# Patient Record
Sex: Male | Born: 1945 | Race: White | Hispanic: No | Smoking: Former smoker
Health system: Southern US, Community
[De-identification: ages and names within clinical notes are randomized; demographics above are authoritative.]

## PROBLEM LIST (undated history)

## (undated) DIAGNOSIS — Z972 Presence of dental prosthetic device (complete) (partial): Secondary | ICD-10-CM

## (undated) DIAGNOSIS — I2510 Atherosclerotic heart disease of native coronary artery without angina pectoris: Secondary | ICD-10-CM

## (undated) DIAGNOSIS — J449 Chronic obstructive pulmonary disease, unspecified: Secondary | ICD-10-CM

## (undated) DIAGNOSIS — IMO0002 Reserved for concepts with insufficient information to code with codable children: Secondary | ICD-10-CM

## (undated) DIAGNOSIS — I10 Essential (primary) hypertension: Secondary | ICD-10-CM

## (undated) DIAGNOSIS — K08109 Complete loss of teeth, unspecified cause, unspecified class: Secondary | ICD-10-CM

## (undated) DIAGNOSIS — Z95810 Presence of automatic (implantable) cardiac defibrillator: Secondary | ICD-10-CM

## (undated) DIAGNOSIS — Z973 Presence of spectacles and contact lenses: Secondary | ICD-10-CM

## (undated) DIAGNOSIS — E230 Hypopituitarism: Secondary | ICD-10-CM

## (undated) DIAGNOSIS — E782 Mixed hyperlipidemia: Secondary | ICD-10-CM

## (undated) DIAGNOSIS — E236 Other disorders of pituitary gland: Secondary | ICD-10-CM

## (undated) DIAGNOSIS — F1011 Alcohol abuse, in remission: Secondary | ICD-10-CM

## (undated) DIAGNOSIS — K219 Gastro-esophageal reflux disease without esophagitis: Secondary | ICD-10-CM

## (undated) DIAGNOSIS — E669 Obesity, unspecified: Secondary | ICD-10-CM

## (undated) DIAGNOSIS — F32A Depression, unspecified: Secondary | ICD-10-CM

## (undated) DIAGNOSIS — G4730 Sleep apnea, unspecified: Secondary | ICD-10-CM

## (undated) DIAGNOSIS — F329 Major depressive disorder, single episode, unspecified: Secondary | ICD-10-CM

## (undated) DIAGNOSIS — E7800 Pure hypercholesterolemia, unspecified: Secondary | ICD-10-CM

## (undated) DIAGNOSIS — M1990 Unspecified osteoarthritis, unspecified site: Secondary | ICD-10-CM

## (undated) HISTORY — PX: ANKLE SURGERY: SHX546

## (undated) HISTORY — DX: Reserved for concepts with insufficient information to code with codable children: IMO0002

## (undated) HISTORY — PX: UMBILICAL HERNIA REPAIR: SHX196

## (undated) HISTORY — DX: Mixed hyperlipidemia: E78.2

## (undated) HISTORY — DX: Atherosclerotic heart disease of native coronary artery without angina pectoris: I25.10

## (undated) HISTORY — DX: Depression, unspecified: F32.A

## (undated) HISTORY — PX: BACK SURGERY: SHX140

## (undated) HISTORY — DX: Other disorders of pituitary gland: E23.6

## (undated) HISTORY — DX: Gastro-esophageal reflux disease without esophagitis: K21.9

## (undated) HISTORY — DX: Essential (primary) hypertension: I10

## (undated) HISTORY — DX: Hypopituitarism: E23.0

## (undated) HISTORY — DX: Alcohol abuse, in remission: F10.11

## (undated) HISTORY — DX: Major depressive disorder, single episode, unspecified: F32.9

## (undated) HISTORY — DX: Chronic obstructive pulmonary disease, unspecified: J44.9

## (undated) HISTORY — DX: Sleep apnea, unspecified: G47.30

## (undated) HISTORY — DX: Obesity, unspecified: E66.9

## (undated) HISTORY — PX: KNEE SURGERY: SHX244

## (undated) HISTORY — PX: NASAL SINUS SURGERY: SHX719

## (undated) HISTORY — DX: Unspecified osteoarthritis, unspecified site: M19.90

## (undated) HISTORY — PX: FEMUR SURGERY: SHX943

---

## 1999-10-27 ENCOUNTER — Inpatient Hospital Stay (HOSPITAL_COMMUNITY): Admission: AD | Admit: 1999-10-27 | Discharge: 1999-10-31 | Payer: Self-pay | Admitting: *Deleted

## 2000-03-11 ENCOUNTER — Inpatient Hospital Stay (HOSPITAL_COMMUNITY): Admission: AD | Admit: 2000-03-11 | Discharge: 2000-03-15 | Payer: Self-pay | Admitting: *Deleted

## 2000-11-01 ENCOUNTER — Ambulatory Visit (HOSPITAL_COMMUNITY): Admission: RE | Admit: 2000-11-01 | Discharge: 2000-11-01 | Payer: Self-pay | Admitting: *Deleted

## 2000-11-01 ENCOUNTER — Encounter: Payer: Self-pay | Admitting: *Deleted

## 2000-12-15 ENCOUNTER — Encounter: Payer: Self-pay | Admitting: Neurosurgery

## 2000-12-15 ENCOUNTER — Ambulatory Visit (HOSPITAL_COMMUNITY): Admission: RE | Admit: 2000-12-15 | Discharge: 2000-12-15 | Payer: Self-pay | Admitting: Neurosurgery

## 2001-01-07 ENCOUNTER — Encounter: Payer: Self-pay | Admitting: Neurosurgery

## 2001-01-07 ENCOUNTER — Inpatient Hospital Stay (HOSPITAL_COMMUNITY): Admission: RE | Admit: 2001-01-07 | Discharge: 2001-01-12 | Payer: Self-pay | Admitting: Neurosurgery

## 2001-01-26 ENCOUNTER — Encounter: Admission: RE | Admit: 2001-01-26 | Discharge: 2001-01-26 | Payer: Self-pay | Admitting: Neurosurgery

## 2001-01-26 ENCOUNTER — Encounter: Payer: Self-pay | Admitting: Neurosurgery

## 2001-02-21 ENCOUNTER — Encounter: Payer: Self-pay | Admitting: Neurosurgery

## 2001-02-21 ENCOUNTER — Encounter: Admission: RE | Admit: 2001-02-21 | Discharge: 2001-02-21 | Payer: Self-pay | Admitting: Neurosurgery

## 2001-04-21 ENCOUNTER — Encounter: Admission: RE | Admit: 2001-04-21 | Discharge: 2001-04-21 | Payer: Self-pay | Admitting: Neurosurgery

## 2001-04-21 ENCOUNTER — Encounter: Payer: Self-pay | Admitting: Neurosurgery

## 2003-08-12 ENCOUNTER — Emergency Department (HOSPITAL_COMMUNITY): Admission: AD | Admit: 2003-08-12 | Discharge: 2003-08-12 | Payer: Self-pay | Admitting: Family Medicine

## 2003-08-14 ENCOUNTER — Emergency Department (HOSPITAL_COMMUNITY): Admission: EM | Admit: 2003-08-14 | Discharge: 2003-08-15 | Payer: Self-pay | Admitting: Internal Medicine

## 2003-08-16 ENCOUNTER — Ambulatory Visit (HOSPITAL_COMMUNITY): Admission: RE | Admit: 2003-08-16 | Discharge: 2003-08-16 | Payer: Self-pay | Admitting: Emergency Medicine

## 2003-08-21 ENCOUNTER — Ambulatory Visit (HOSPITAL_COMMUNITY): Admission: RE | Admit: 2003-08-21 | Discharge: 2003-08-21 | Payer: Self-pay | Admitting: Neurosurgery

## 2003-08-21 ENCOUNTER — Emergency Department (HOSPITAL_COMMUNITY): Admission: EM | Admit: 2003-08-21 | Discharge: 2003-08-21 | Payer: Self-pay | Admitting: Emergency Medicine

## 2003-08-30 ENCOUNTER — Inpatient Hospital Stay (HOSPITAL_COMMUNITY): Admission: RE | Admit: 2003-08-30 | Discharge: 2003-08-31 | Payer: Self-pay | Admitting: Neurosurgery

## 2004-02-18 ENCOUNTER — Inpatient Hospital Stay (HOSPITAL_BASED_OUTPATIENT_CLINIC_OR_DEPARTMENT_OTHER): Admission: RE | Admit: 2004-02-18 | Discharge: 2004-02-18 | Payer: Self-pay | Admitting: Cardiology

## 2004-07-14 ENCOUNTER — Emergency Department (HOSPITAL_COMMUNITY): Admission: EM | Admit: 2004-07-14 | Discharge: 2004-07-14 | Payer: Self-pay | Admitting: Emergency Medicine

## 2004-08-07 ENCOUNTER — Ambulatory Visit: Payer: Self-pay | Admitting: Cardiology

## 2004-10-31 ENCOUNTER — Ambulatory Visit: Payer: Self-pay | Admitting: Cardiology

## 2005-12-11 ENCOUNTER — Ambulatory Visit: Payer: Self-pay | Admitting: Cardiology

## 2005-12-21 ENCOUNTER — Ambulatory Visit: Payer: Self-pay | Admitting: Cardiology

## 2005-12-21 ENCOUNTER — Encounter: Payer: Self-pay | Admitting: Cardiology

## 2006-01-13 ENCOUNTER — Ambulatory Visit: Payer: Self-pay | Admitting: Cardiology

## 2006-01-15 ENCOUNTER — Ambulatory Visit: Payer: Self-pay | Admitting: Cardiology

## 2006-04-09 ENCOUNTER — Ambulatory Visit (HOSPITAL_COMMUNITY): Admission: RE | Admit: 2006-04-09 | Discharge: 2006-04-09 | Payer: Self-pay | Admitting: General Surgery

## 2007-03-30 ENCOUNTER — Ambulatory Visit: Payer: Self-pay | Admitting: Physician Assistant

## 2007-03-31 ENCOUNTER — Encounter: Payer: Self-pay | Admitting: Physician Assistant

## 2007-05-10 ENCOUNTER — Ambulatory Visit: Payer: Self-pay | Admitting: Cardiology

## 2007-05-17 ENCOUNTER — Ambulatory Visit: Payer: Self-pay | Admitting: Cardiology

## 2008-03-06 ENCOUNTER — Encounter: Payer: Self-pay | Admitting: Cardiology

## 2008-03-08 ENCOUNTER — Encounter: Payer: Self-pay | Admitting: Cardiology

## 2008-05-29 ENCOUNTER — Ambulatory Visit: Payer: Self-pay | Admitting: Cardiology

## 2008-07-16 ENCOUNTER — Encounter: Payer: Self-pay | Admitting: Cardiology

## 2008-10-22 ENCOUNTER — Encounter: Payer: Self-pay | Admitting: Cardiology

## 2008-12-23 ENCOUNTER — Ambulatory Visit: Payer: Self-pay | Admitting: Cardiology

## 2008-12-24 ENCOUNTER — Inpatient Hospital Stay (HOSPITAL_COMMUNITY): Admission: EM | Admit: 2008-12-24 | Discharge: 2008-12-28 | Payer: Self-pay | Admitting: Emergency Medicine

## 2008-12-24 ENCOUNTER — Encounter (INDEPENDENT_AMBULATORY_CARE_PROVIDER_SITE_OTHER): Payer: Self-pay | Admitting: Internal Medicine

## 2008-12-24 ENCOUNTER — Encounter: Payer: Self-pay | Admitting: Cardiology

## 2009-01-17 ENCOUNTER — Ambulatory Visit: Payer: Self-pay | Admitting: Cardiology

## 2009-01-17 ENCOUNTER — Encounter: Payer: Self-pay | Admitting: Physician Assistant

## 2009-01-20 ENCOUNTER — Inpatient Hospital Stay (HOSPITAL_COMMUNITY): Admission: EM | Admit: 2009-01-20 | Discharge: 2009-01-22 | Payer: Self-pay | Admitting: Emergency Medicine

## 2009-01-20 ENCOUNTER — Ambulatory Visit: Payer: Self-pay | Admitting: Internal Medicine

## 2009-01-24 ENCOUNTER — Telehealth (INDEPENDENT_AMBULATORY_CARE_PROVIDER_SITE_OTHER): Payer: Self-pay | Admitting: *Deleted

## 2009-02-04 ENCOUNTER — Ambulatory Visit: Payer: Self-pay | Admitting: Internal Medicine

## 2009-02-04 DIAGNOSIS — R002 Palpitations: Secondary | ICD-10-CM | POA: Insufficient documentation

## 2009-02-04 DIAGNOSIS — I2510 Atherosclerotic heart disease of native coronary artery without angina pectoris: Secondary | ICD-10-CM | POA: Insufficient documentation

## 2009-02-04 DIAGNOSIS — I959 Hypotension, unspecified: Secondary | ICD-10-CM | POA: Insufficient documentation

## 2009-02-04 DIAGNOSIS — I495 Sick sinus syndrome: Secondary | ICD-10-CM | POA: Insufficient documentation

## 2009-02-08 ENCOUNTER — Ambulatory Visit: Payer: Self-pay | Admitting: Cardiology

## 2009-02-08 ENCOUNTER — Encounter: Payer: Self-pay | Admitting: Internal Medicine

## 2009-03-11 ENCOUNTER — Telehealth (INDEPENDENT_AMBULATORY_CARE_PROVIDER_SITE_OTHER): Payer: Self-pay | Admitting: *Deleted

## 2009-04-18 ENCOUNTER — Encounter: Payer: Self-pay | Admitting: Cardiology

## 2009-05-24 ENCOUNTER — Ambulatory Visit: Payer: Self-pay | Admitting: Cardiology

## 2009-05-24 DIAGNOSIS — I429 Cardiomyopathy, unspecified: Secondary | ICD-10-CM | POA: Insufficient documentation

## 2009-05-24 DIAGNOSIS — E782 Mixed hyperlipidemia: Secondary | ICD-10-CM | POA: Insufficient documentation

## 2009-05-24 DIAGNOSIS — I5022 Chronic systolic (congestive) heart failure: Secondary | ICD-10-CM | POA: Insufficient documentation

## 2009-06-12 ENCOUNTER — Encounter: Payer: Self-pay | Admitting: Internal Medicine

## 2009-06-13 ENCOUNTER — Ambulatory Visit: Payer: Self-pay | Admitting: Internal Medicine

## 2009-06-13 ENCOUNTER — Encounter (INDEPENDENT_AMBULATORY_CARE_PROVIDER_SITE_OTHER): Payer: Self-pay | Admitting: *Deleted

## 2009-06-14 LAB — CONVERTED CEMR LAB
BUN: 15 mg/dL (ref 6–23)
Basophils Absolute: 0 10*3/uL (ref 0.0–0.1)
Basophils Relative: 1 % (ref 0.0–3.0)
CO2: 32 meq/L (ref 19–32)
Calcium: 9.2 mg/dL (ref 8.4–10.5)
Chloride: 98 meq/L (ref 96–112)
Creatinine, Ser: 1 mg/dL (ref 0.4–1.5)
Eosinophils Absolute: 0.1 10*3/uL (ref 0.0–0.7)
Eosinophils Relative: 3 % (ref 0.0–5.0)
GFR calc non Af Amer: 80.29 mL/min (ref >60)
Glucose, Bld: 100 mg/dL — ABNORMAL HIGH (ref 70–99)
HCT: 40.1 % (ref 39.0–52.0)
Hemoglobin: 13.6 g/dL (ref 13.0–17.0)
INR: 1 (ref 0.8–1.0)
Lymphocytes Relative: 39.7 % (ref 12.0–46.0)
Lymphs Abs: 2 10*3/uL (ref 0.7–4.0)
MCHC: 33.9 g/dL (ref 30.0–36.0)
MCV: 93.3 fL (ref 78.0–100.0)
Monocytes Absolute: 0.6 10*3/uL (ref 0.1–1.0)
Monocytes Relative: 12.2 % — ABNORMAL HIGH (ref 3.0–12.0)
Neutro Abs: 2.3 10*3/uL (ref 1.4–7.7)
Neutrophils Relative %: 44.1 % (ref 43.0–77.0)
Platelets: 246 10*3/uL (ref 150.0–400.0)
Potassium: 4.4 meq/L (ref 3.5–5.1)
Prothrombin Time: 10.4 s (ref 9.1–11.7)
RBC: 4.3 M/uL (ref 4.22–5.81)
RDW: 12.3 % (ref 11.5–14.6)
Sodium: 141 meq/L (ref 135–145)
WBC: 5 10*3/uL (ref 4.5–10.5)
aPTT: 31.4 s — ABNORMAL HIGH (ref 21.7–28.8)

## 2009-06-18 ENCOUNTER — Ambulatory Visit: Payer: Self-pay | Admitting: Internal Medicine

## 2009-06-18 ENCOUNTER — Inpatient Hospital Stay (HOSPITAL_COMMUNITY): Admission: AD | Admit: 2009-06-18 | Discharge: 2009-06-19 | Payer: Self-pay | Admitting: Internal Medicine

## 2009-06-18 HISTORY — PX: OTHER SURGICAL HISTORY: SHX169

## 2009-06-19 ENCOUNTER — Encounter: Payer: Self-pay | Admitting: Internal Medicine

## 2009-06-26 ENCOUNTER — Ambulatory Visit: Payer: Self-pay | Admitting: Internal Medicine

## 2009-07-10 IMAGING — CR DG CHEST 1V PORT
1 series · 1 of 1 positions shown · non-contrast
Comparison: None available.

CLINICAL DATA: Weakness.

PORTABLE CHEST - 1 VIEW

[AP]
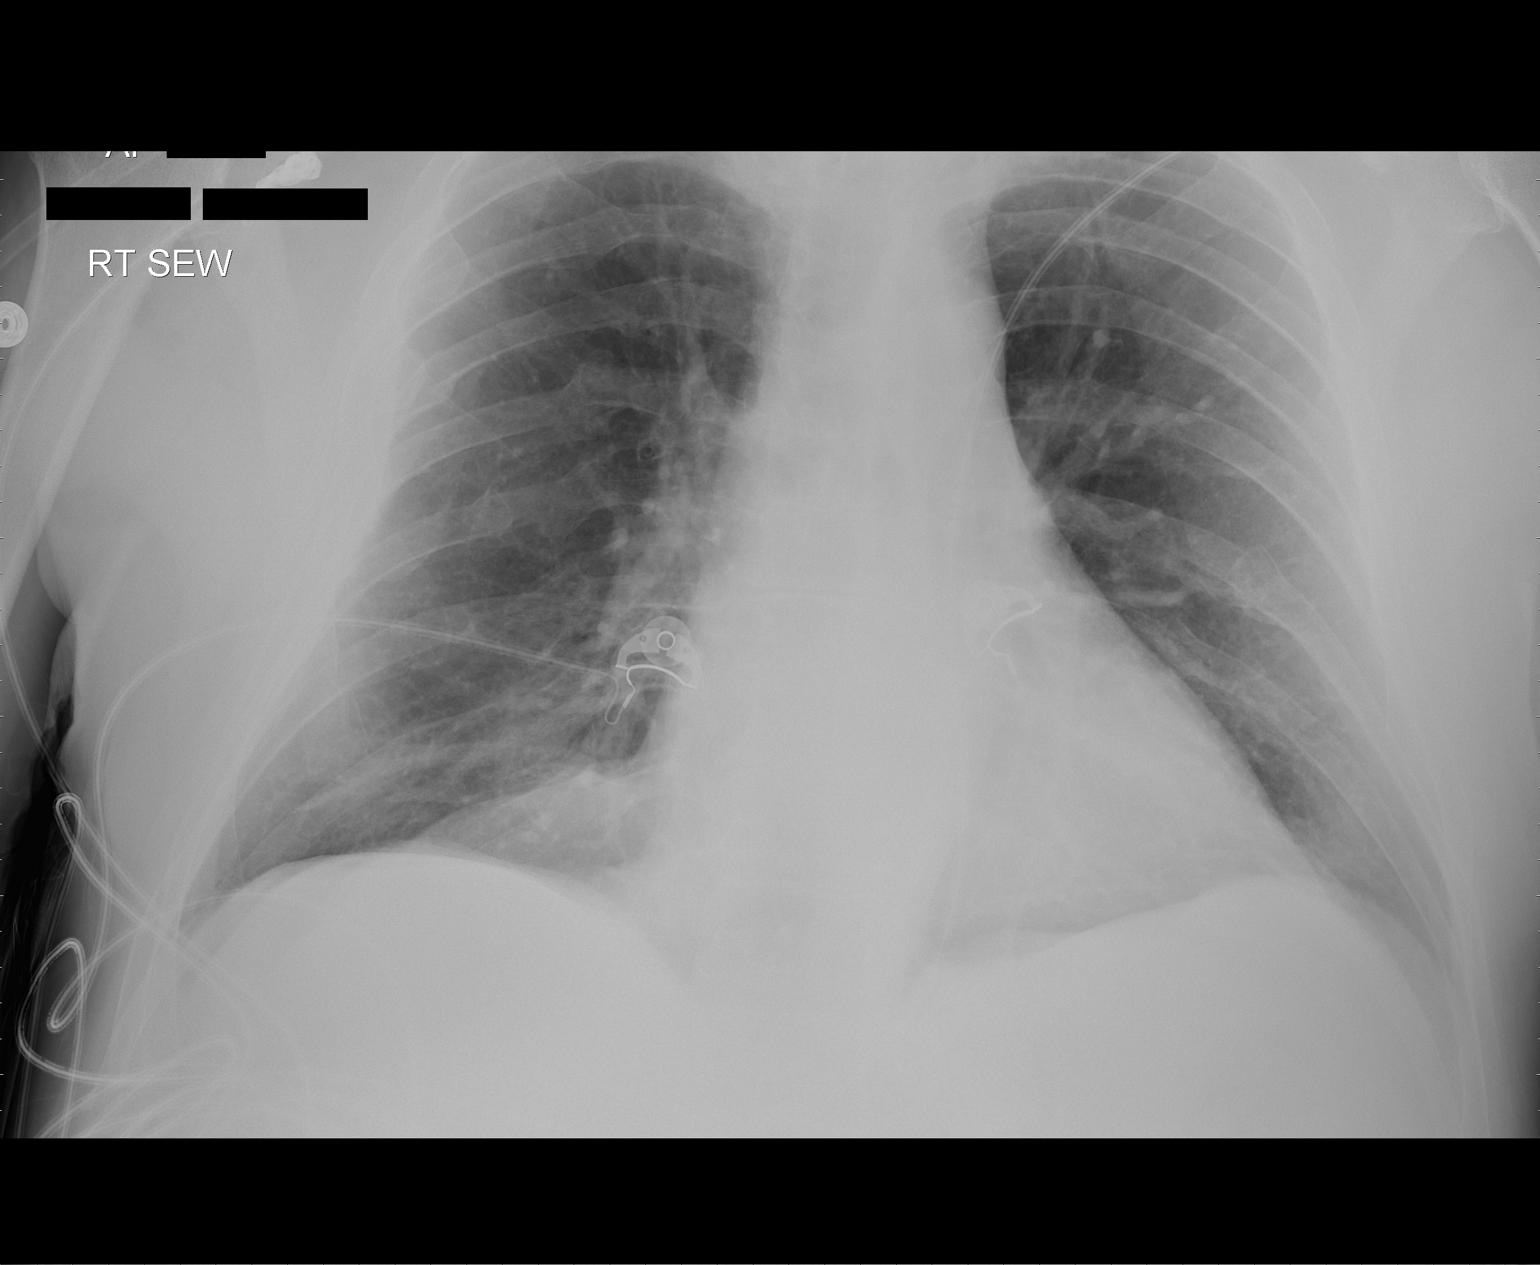

[1 of 1 positions shown; findings below may reference images not displayed]

FINDINGS: The chest is somewhat hyperexpanded.  There is some mild
atelectatic change in the right lung base.  Lungs otherwise clear.
Heart size is upper normal.  Multiple remote bilateral rib
fractures are noted.
IMPRESSION: 1.  Mild right basilar atelectasis.
2.  Chest hyperexpansion compatible with emphysema.

## 2009-09-04 ENCOUNTER — Ambulatory Visit: Payer: Self-pay | Admitting: Internal Medicine

## 2009-09-04 DIAGNOSIS — E860 Dehydration: Secondary | ICD-10-CM | POA: Insufficient documentation

## 2009-12-04 ENCOUNTER — Emergency Department (HOSPITAL_COMMUNITY): Admission: EM | Admit: 2009-12-04 | Discharge: 2009-12-04 | Payer: Self-pay | Admitting: Emergency Medicine

## 2010-01-17 ENCOUNTER — Ambulatory Visit: Payer: Self-pay | Admitting: Internal Medicine

## 2010-02-02 ENCOUNTER — Telehealth: Payer: Self-pay | Admitting: Adult Health

## 2010-02-03 ENCOUNTER — Encounter (INDEPENDENT_AMBULATORY_CARE_PROVIDER_SITE_OTHER): Payer: Self-pay | Admitting: *Deleted

## 2010-02-07 ENCOUNTER — Ambulatory Visit: Payer: Self-pay | Admitting: Internal Medicine

## 2010-04-15 ENCOUNTER — Ambulatory Visit (HOSPITAL_COMMUNITY): Admission: RE | Admit: 2010-04-15 | Discharge: 2010-04-15 | Payer: Self-pay | Admitting: General Surgery

## 2010-05-23 ENCOUNTER — Ambulatory Visit: Payer: Self-pay | Admitting: Internal Medicine

## 2010-09-04 ENCOUNTER — Encounter: Payer: Self-pay | Admitting: Cardiology

## 2010-09-08 ENCOUNTER — Ambulatory Visit: Admit: 2010-09-08 | Payer: Self-pay | Admitting: Internal Medicine

## 2010-09-09 ENCOUNTER — Telehealth (INDEPENDENT_AMBULATORY_CARE_PROVIDER_SITE_OTHER): Payer: Self-pay | Admitting: *Deleted

## 2010-09-19 ENCOUNTER — Emergency Department (HOSPITAL_COMMUNITY)
Admission: EM | Admit: 2010-09-19 | Discharge: 2010-09-19 | Payer: Self-pay | Source: Home / Self Care | Admitting: Emergency Medicine

## 2010-09-26 ENCOUNTER — Telehealth: Payer: Self-pay | Admitting: Internal Medicine

## 2010-09-26 ENCOUNTER — Ambulatory Visit: Admit: 2010-09-26 | Payer: Self-pay | Admitting: Internal Medicine

## 2010-09-29 ENCOUNTER — Ambulatory Visit: Admit: 2010-09-29 | Payer: Self-pay

## 2010-09-30 NOTE — Procedures (Signed)
Summary: dfp- PATIENT R/S   Current Medications (verified): 1)  Carvedilol 6.25 Mg Tabs (Carvedilol) .... One By Mouth Two Times A Day 2)  Bayer Aspirin 325 Mg Tabs (Aspirin) .... Take One Tablet Once Daily 3)  Crestor 10 Mg Tabs (Rosuvastatin Calcium) .... Take One Tablet Once Daily 4)  Lisinopril 2.5 Mg Tabs (Lisinopril) .... Take One Tablet Once Daily 5)  Oxycontin 80 Mg Xr12h-Tab (Oxycodone Hcl) .Marland Kitchen.. 1 Tab As Needed 6)  Folic Acid 1 Mg Tabs (Folic Acid) .... Take One Tablet Once Daily 7)  Effexor Xr 150 Mg Xr24h-Cap (Venlafaxine Hcl) .... Take Once Daily 8)  Xyzal 5 Mg Tabs (Levocetirizine Dihydrochloride) .... Take Once Daily 9)  Furosemide 40 Mg Tabs (Furosemide) .... Take One Tablet Once Daily 10)  Endocet 10-325 Mg Tabs (Oxycodone-Acetaminophen) .... Take One Tablet Ever 4-6 Hours As Needed  Allergies (verified): 1)  ! Pcn   ICD Specifications Following MD:  Hillis Range, MD     Referring MD:  MCDOWELL ICD Vendor:  St Jude     ICD Model Number:  [phone]     ICD Serial Number:  [id number] ICD DOI:  06/18/2009     ICD Implanting MD:  Hillis Range, MD  Lead 1:    Location: RA     DOI: 06/18/2009     Model #: 1888TC     Serial #: [id number]     Status: active Lead 2:    Location: RV     DOI: 06/18/2009     Model #: 9562     Serial #: [id number]     Status: active  Indications::  ICM   ICD Follow Up Charge Time:  8.6 seconds     Battery Est. Longevity:  7.3-8.5 yrs Underlying rhythm:  SR ICD Dependent:  No       ICD Device Measurements Atrium:  Amplitude: 4.7 mV, Impedance: 350 ohms, Threshold: 0.5 V at 0.5 msec Right Ventricle:  Amplitude: 11.7 mV, Impedance: 600 ohms, Threshold: 0.75 V at 0.5 msec Shock Impedance: 49 ohms   Episodes MS Episodes:  54     Percent Mode Switch:  <1%     Coumadin:  No Shock:  0     ATP:  0     Nonsustained:  0     Atrial Therapies:  0 Atrial Pacing:  13%     Ventricular Pacing:  <1%  Brady Parameters Mode DDDR     Lower Rate Limit:  60     Upper  Rate Limit 115 PAV 225     Sensed AV Delay:  225  Tachy Zones VF:  200     VT:  169 (MONITOR)     Next Cardiology Appt Due:  03/31/2010 Tech Comments:  54 MODE SWITCHES--LONGEST WAS 30 MINUTES. LAST EPISODE ON 01-01-10. NORMAL THRESHOLD TESTING.  NO CHANGES MADE. ROV IN 3 MTHS.  Vella Kohler  Jan 17, 2010 10:33 AM MD Comments:  mode switches are 1:1 AV,  likely due to oversensing.  Consider adjusting atrial sensitivty on return

## 2010-09-30 NOTE — Cardiovascular Report (Signed)
Summary: Office Visit   Office Visit   Imported By: Roderic Ovens 02/28/2010 15:50:35  _____________________________________________________________________  External Attachment:    Type:   Image     Comment:   External Document

## 2010-09-30 NOTE — Progress Notes (Signed)
  Phone Note Call from Patient   Action Taken: Phone Call Completed Details for Reason: Near syncope Summary of Call: Patient called because he has near syncope 3 times today.  Each time he checked his blood pressure it remained low in the 80's systolic.  He was outside the day before working.  Not today. He continues to take his medications as directed.  He denies chest pain or palpatations.  I have advised him to hold his coreg dose tonight and also his lasix dose in the morning.  He can take the coreg.  He may be having some dehydration related to the lasix and heat.  He will need to have a follow-up appointment this week for BP check and medication adjustments. Message left on answering machine of Adolph Pollack. Initial call taken by: Joni Reining NP     Appended Document:  Attempted to reach pt to schedule appt with Gene Serpe, PA-C for Wednesday of this week. Pt's number in IDX/EMR has been d/c'd. Phone note from NP does not have updated phone number. Unable to reach patient by phone. Letter will be mailed asking pt to contact office for appt.  Appended Document:  Pt called the office stating that his phone hasn't been working. He called from cell phone [phone]). Pt states his BP was 131/85 this am and about 1 hr after taking meds his bp was 111/71. He states someone he know has a device and a wire came loose so it wasn't working correctly. He wants to come for device check to make sure device is working properly. Pt scheduled by Judeth Cornfield with device tech/nurse on Friday, June 10th at 11:30 per his request. Pt notified by nurse that he will not see MD and his medication review recommended by Joni Reining during their phone call will not be addressed. Pt refused appt with PA to discuss meds. Pt verbally confirmed that he only wants his device checked and does not want to discuss meds with PA.

## 2010-09-30 NOTE — Cardiovascular Report (Signed)
Summary: [facility]  [facility]   Imported By: Dorise Hiss 09/09/2009 16:32:26  _____________________________________________________________________  External Attachment:    Type:   Image     Comment:   External Document

## 2010-09-30 NOTE — Procedures (Signed)
Summary: icd check/st. jude   Current Medications (verified): 1)  Carvedilol 6.25 Mg Tabs (Carvedilol) .... One By Mouth Two Times A Day 2)  Bayer Aspirin 325 Mg Tabs (Aspirin) .... Take One Tablet Once Daily 3)  Crestor 20 Mg Tabs (Rosuvastatin Calcium) .... Take One Tablet Once Daily 4)  Lisinopril 2.5 Mg Tabs (Lisinopril) .... Take One Tablet Once Daily 5)  Oxycontin 80 Mg Xr12h-Tab (Oxycodone Hcl) .Marland Kitchen.. 1 Tab As Needed 6)  Folic Acid 1 Mg Tabs (Folic Acid) .... Take One Tablet Once Daily 7)  Effexor Xr 150 Mg Xr24h-Cap (Venlafaxine Hcl) .... Take Once Daily 8)  Furosemide 40 Mg Tabs (Furosemide) .... Take One Tablet Once Daily 9)  Endocet 10-325 Mg Tabs (Oxycodone-Acetaminophen) .... Take One Tablet Ever 4-6 Hours As Needed 10)  Gabapentin 300 Mg Caps (Gabapentin) .... Take 1 Capsule By Mouth Three Times A Day 11)  Fenofibrate 160 Mg Tabs (Fenofibrate) .... Take 1 Tablet By Mouth At Bedtime  Allergies (verified): 1)  ! Pcn   ICD Specifications Following MD:  Hillis Range, MD     Referring MD:  MCDOWELL ICD Vendor:  St Jude     ICD Model Number:  [phone]     ICD Serial Number:  [id number] ICD DOI:  06/18/2009     ICD Implanting MD:  Hillis Range, MD  Lead 1:    Location: RA     DOI: 06/18/2009     Model #: 1888TC     Serial #: [id number]     Status: active Lead 2:    Location: RV     DOI: 06/18/2009     Model #: 8341     Serial #: [id number]     Status: active  Indications::  ICM   ICD Follow Up Battery Voltage:  89% V     Charge Time:  8.8 seconds     Battery Est. Longevity:  7.0-8.2 yrs Underlying rhythm:  SR ICD Dependent:  No       ICD Device Measurements Atrium:  Amplitude: 4.4 mV, Impedance: 350 ohms, Threshold: 0.5 V at 0.5 msec Right Ventricle:  Amplitude: 12.0 mV, Impedance: 580 ohms, Threshold: 1.0 V at 0.5 msec Shock Impedance: 55 ohms   Episodes MS Episodes:  27     Percent Mode Switch:  <1%     Coumadin:  No Shock:  0     ATP:  0     Nonsustained:  0     Atrial  Therapies:  0 Atrial Pacing:  13%     Ventricular Pacing:  <1%  Brady Parameters Mode DDDR     Lower Rate Limit:  60     Upper Rate Limit 115 PAV 225     Sensed AV Delay:  225  Tachy Zones VF:  200     VT:  169 (MONITOR)     Next Cardiology Appt Due:  09/08/2010 Tech Comments:  27 AMS EPISODES--ALL ATRIAL TACH EPISODES.  NORMAL DEVICE FUNCTION.  NO CHANGES MADE. ROV 09-08-10 W/JA IN [place]. Vella Kohler  May 23, 2010 3:49 PM

## 2010-09-30 NOTE — Assessment & Plan Note (Signed)
Summary: 3 mo fu per [name] -srs   Visit Type:  Pacemaker check Primary Provider:  Dr. Samuel Jester  CC:  pacer maker check.  History of Present Illness: The patient presents today for routine electrophysiology followup. He reports doing very well since having his ICD implanted.  His postoperative course was unremarkable. Since yesterday, however, the patient reports symtoms of nausea and orthostatic dizziness.  She also reports feeling "very thirsty".  He denies chest pain, shortness of breath, orthopnea, PND, lower extremity edema,  presyncope, syncope, or neurologic sequela. The patient is tolerating medications without difficulties and is otherwise without complaint today.   Preventive Screening-Counseling & Management  Alcohol-Tobacco     Smoking Status: quit     Year Quit: 1998  Current Medications (verified): 1)  Carvedilol 6.25 Mg Tabs (Carvedilol) .... Take One Tablet At Bedtime 2)  Bayer Aspirin 325 Mg Tabs (Aspirin) .... Take One Tablet Once Daily 3)  Crestor 10 Mg Tabs (Rosuvastatin Calcium) .... Take One Tablet Once Daily 4)  Lisinopril 2.5 Mg Tabs (Lisinopril) .... Take One Tablet Once Daily 5)  Oxycontin 80 Mg Xr12h-Tab (Oxycodone Hcl) .Marland Kitchen.. 1 Tab As Needed 6)  Folic Acid 1 Mg Tabs (Folic Acid) .... Take One Tablet Once Daily 7)  Effexor Xr 150 Mg Xr24h-Cap (Venlafaxine Hcl) .... Take Once Daily 8)  Xyzal 5 Mg Tabs (Levocetirizine Dihydrochloride) .... Take Once Daily 9)  Furosemide 40 Mg Tabs (Furosemide) .... Take One Tablet Once Daily 10)  Endocet 10-325 Mg Tabs (Oxycodone-Acetaminophen) .... Take One Tablet Ever 4-6 Hours As Needed  Allergies (verified): 1)  ! Pcn  Comments:  Nurse/Medical Assistant: The patient is currently on medications but does not know the name or dosage at this time. Instructed to contact our office with details. Will update medication list at that time.  Past History:  Past Medical History: Reviewed history from 06/13/2009 and  no changes required.  1. 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   2. Coronary artery disease with cardiac catheterization in 2005 showing a chronically-occluded right coronary artery and a borderline lesion in the ramus vessel.  Of note, he recently had a cardiac MRI, which showed decreased left ventricular systolic function with an ejection fraction of 34% and basal and midinferolateral walls were akinetic.   3. Hypertension.   4. Hyperlipidemia.   5. History of alcohol abuse  6. Depression.   7. Obesity.   8. Sleep apnea, on CPAP therapy.   9. COPD.   10.Osteoarthritis.   11.Degenerative joint disease.   12.Degenerative disk disease.   13.Gastroesophageal reflux disease.   Past Surgical History: Reviewed history from 06/13/2009 and no changes required.  1. Multiple prior back surgeries for chronic back pain and lumbar       fusion.   2. Umbilical hernia repair.   Social History: Reviewed history from 05/24/2009 and no changes required. Married  Tobacco Use - Former. - hx of heavy abuse, quit several yrs ago Alcohol Use - yes--Hx of heavy alcohol abuse- pt reports May 2010  sobriety of one month. He still admits to occasional binges.  Vital Signs:  Patient profile:   65 year old male Height:      70 inches Weight:      210 pounds Pulse rate:   100 / minute BP sitting:   116 / 81  (left arm) Cuff size:   large CC: pacer maker check   Physical Exam  General:  Well developed, well nourished, in no acute  distress. Head:  normocephalic and atraumatic Eyes:  PERRLA/EOM intact; conjunctiva and lids normal. Mouth:  dry OP Neck:  supple, flat JVP Chest Wall:  ICD pocket is well healed Lungs:  Clear bilaterally to auscultation and percussion. Heart:  tachy regular rhythm, no m/r/g Abdomen:  Bowel sounds positive; abdomen soft and non-tender without masses, organomegaly, or hernias noted. No hepatosplenomegaly. Msk:  Back normal, normal gait. Muscle strength and  tone normal. Pulses:  pulses normal in all 4 extremities Extremities:  No clubbing or cyanosis. Neurologic:  Alert and oriented x 3. Skin:  Intact without lesions or rashes. Cervical Nodes:  no significant adenopathy Psych:  Normal affect.    ICD Specifications Following MD:  Hillis Range, MD     Referring MD:  MCDOWELL ICD Vendor:  St Jude     ICD Model Number:  [phone]     ICD Serial Number:  [id number] ICD DOI:  06/18/2009     ICD Implanting MD:  Hillis Range, MD  Lead 1:    Location: RA     DOI: 06/18/2009     Model #: 1888TC     Serial #: [id number]     Status: active Lead 2:    Location: RV     DOI: 06/18/2009     Model #: 0865     Serial #: [id number]     Status: active  Indications::  ICM   ICD Follow Up Remote Check?  No Battery Voltage:  95% V     Charge Time:  9.1 seconds     Battery Est. Longevity:  8.1 YEARS Underlying rhythm:  ST ICD Dependent:  No       ICD Device Measurements Atrium:  Amplitude: 3.8 mV, Impedance: 380 ohms, Threshold: 0.75 V at 0.5 msec Right Ventricle:  Amplitude: 11.7 mV, Impedance: 660 ohms, Threshold: 1.0 V at 0.5 msec Shock Impedance: 53 ohms   Episodes MS Episodes:  15     Percent Mode Switch:  <1%     Coumadin:  No Shock:  0     ATP:  0     Nonsustained:  0     Atrial Pacing:  15%     Ventricular Pacing:  <1%  Brady Parameters Mode DDDR     Lower Rate Limit:  60     Upper Rate Limit 115 PAV 225     Sensed AV Delay:  225  Tachy Zones VF:  200     VT:  169 (MONITOR)     Tech Comments:  Normal device function.  Pt in sinus tach today.  Mode switch episodes are ST with 1:1 conduction.  RA and RV outputs decreased to chronic levels.  No other changes made.  ROV 3 months [facility] device clinic. Gypsy Balsam RN BSN  September 04, 2009 2:04 PM   Impression & Recommendations:  Problem # 1:  DEHYDRATION (ICD-276.51) The patient appears dehydrated on exam.  He is also tachycardic and complains of orthostatic dizziness. I will have pt hold lasix x 3 days.   If symptoms worsen, then he is advised to go to the ER.  Problem # 2:  CARDIOMYOPATHY, SECONDARY (ICD-425.9) Stable, without symptoms of CHF. Normal ICD function His updated medication list for this problem includes:    Carvedilol 6.25 Mg Tabs (Carvedilol) .Marland Kitchen... Take one tablet at bedtime    Bayer Aspirin 325 Mg Tabs (Aspirin) .Marland Kitchen... Take one tablet once daily    Lisinopril 2.5 Mg Tabs (Lisinopril) .Marland Kitchen... Take one tablet  once daily    Furosemide 40 Mg Tabs (Furosemide) .Marland Kitchen... Take one tablet once daily  Problem # 3:  MIXED HYPERLIPIDEMIA (ICD-272.2) stable His updated medication list for this problem includes:    Crestor 10 Mg Tabs (Rosuvastatin calcium) .Marland Kitchen... Take one tablet once daily  Patient Instructions: 1)  Return in 3 months

## 2010-09-30 NOTE — Cardiovascular Report (Signed)
Summary: Office Visit   Office Visit   Imported By: Roderic Ovens 05/30/2010 12:47:49  _____________________________________________________________________  External Attachment:    Type:   Image     Comment:   External Document

## 2010-09-30 NOTE — Letter (Signed)
Summary: Generic Engineer, agricultural at [facility] [address]   [place], [place] [address]   Phone: [phone]  Fax: [phone]        February 03, 2010 MRN: [id number]    [facility] [address] [place], [place]  [address]    Dear Mr. Befort,   We received a note from one of our physician extenders regarding your conversation with her this weekend. We attemted to reach you by phone to set up an appointment with our physician assistant, but the contact number in our system is incorrect.   Please contact our office at [phone] as soon as possible to set up this appointment. Also, please update your contact information at this time.      Sincerely,  Cyril Loosen, RN, BSN  This letter has been electronically signed by your physician.

## 2010-09-30 NOTE — Cardiovascular Report (Signed)
Summary: [facility]   [facility]   Imported By: Roderic Ovens 01/31/2010 15:50:44  _____________________________________________________________________  External Attachment:    Type:   Image     Comment:   External Document

## 2010-09-30 NOTE — Procedures (Signed)
Summary: DFP   Current Medications (verified): 1)  Carvedilol 6.25 Mg Tabs (Carvedilol) .... One By Mouth Two Times A Day 2)  Bayer Aspirin 325 Mg Tabs (Aspirin) .... Take One Tablet Once Daily 3)  Crestor 20 Mg Tabs (Rosuvastatin Calcium) .... Take One Tablet Once Daily 4)  Lisinopril 2.5 Mg Tabs (Lisinopril) .... Take One Tablet Once Daily 5)  Oxycontin 80 Mg Xr12h-Tab (Oxycodone Hcl) .Marland Kitchen.. 1 Tab As Needed 6)  Folic Acid 1 Mg Tabs (Folic Acid) .... Take One Tablet Once Daily 7)  Effexor Xr 150 Mg Xr24h-Cap (Venlafaxine Hcl) .... Take Once Daily 8)  Xyzal 5 Mg Tabs (Levocetirizine Dihydrochloride) .... Take Once Daily 9)  Furosemide 40 Mg Tabs (Furosemide) .... Take One Tablet Once Daily 10)  Endocet 10-325 Mg Tabs (Oxycodone-Acetaminophen) .... Take One Tablet Ever 4-6 Hours As Needed  Allergies (verified): 1)  ! Pcn    ICD Specifications Following MD:  Hillis Range, MD     Referring MD:  MCDOWELL ICD Vendor:  St Jude     ICD Model Number:  [phone]     ICD Serial Number:  [id number] ICD DOI:  06/18/2009     ICD Implanting MD:  Hillis Range, MD  Lead 1:    Location: RA     DOI: 06/18/2009     Model #: 1888TC     Serial #: [id number]     Status: active Lead 2:    Location: RV     DOI: 06/18/2009     Model #: 9562     Serial #: [id number]     Status: active  Indications::  ICM   ICD Follow Up Remote Check?  No Charge Time:  8.6  seconds     Battery Est. Longevity:  7.2 years Underlying rhythm:  SR ICD Dependent:  No       ICD Device Measurements Atrium:  Amplitude: 3.2 mV, Impedance: 310 ohms, Threshold: 0.5 V at 0.5 msec Right Ventricle:  Amplitude: 11.7 mV, Impedance: 580 ohms, Threshold: 1.0 V at 0.5 msec Shock Impedance: 50 ohms   Episodes MS Episodes:  1     Percent Mode Switch:  <1%     Coumadin:  No Shock:  0     ATP:  0     Nonsustained:  0     Atrial Pacing:  14%     Ventricular Pacing:  <1%  Brady Parameters Mode DDDR     Lower Rate Limit:  60     Upper Rate Limit  115 PAV 225     Sensed AV Delay:  225  Tachy Zones VF:  200     VT:  169 (MONITOR)     Next Cardiology Appt Due:  05/01/2010 Tech Comments:  No parameter changes.  Device function normal.  ROV 3 months [facility]. Altha Harm, LPN  February 07, 2010 11:53 AM  MD Comments:  V rates are elevated at times (likely sinus tach), however, pt has had dehydration and hypotension previously.  Will follow this

## 2010-10-02 NOTE — Progress Notes (Signed)
Summary: calling regarding appt  Phone Note Call from Patient Call back at Home Phone [phone]   Caller: Patient Summary of Call: pt request call due to having to can appt on today Initial call taken by: Judie Grieve,  September 26, 2010 9:21 AM  Follow-up for Phone Call        tried to call pt but his phone does not have vm set up.  Pt will need to follow up in [facility] with Dr Johney Frame on 10/08/10 Dathan Bast, RN, BSN  September 26, 2010 10:22 AM

## 2010-10-02 NOTE — Progress Notes (Signed)
Summary: Feeling Bad  Phone Note Call from Patient   Summary of Call: Pt called stating he missed appt yesterday with Dr. Johney Frame. He states he has felt like for the past few days that he has the flu (weak/tired) and coughing up stuff. He states his primary MD put him on an antibiotic that he finished last night. He states he still felt bad this am so he check BP He states BP was 94/42 with HR in 40's. He states now BP is 104/61 HR 72. Pt states he just feels bad today and thinks he should go to ER but wants to know what ER to go to in order to see Dr. Johney Frame. Pt notified EP MD's do round at [facility] but not [facility]. Pt will go there if not feeling better. Initial call taken by: Cyril Loosen, RN, BSN,  September 09, 2010 3:20 PM

## 2010-10-08 ENCOUNTER — Encounter: Payer: Self-pay | Admitting: Internal Medicine

## 2010-10-08 ENCOUNTER — Ambulatory Visit (INDEPENDENT_AMBULATORY_CARE_PROVIDER_SITE_OTHER): Payer: PRIVATE HEALTH INSURANCE | Admitting: Internal Medicine

## 2010-10-08 DIAGNOSIS — I2589 Other forms of chronic ischemic heart disease: Secondary | ICD-10-CM

## 2010-10-08 DIAGNOSIS — I2510 Atherosclerotic heart disease of native coronary artery without angina pectoris: Secondary | ICD-10-CM

## 2010-10-08 DIAGNOSIS — I10 Essential (primary) hypertension: Secondary | ICD-10-CM

## 2010-10-08 DIAGNOSIS — I5022 Chronic systolic (congestive) heart failure: Secondary | ICD-10-CM

## 2010-10-08 NOTE — Progress Notes (Signed)
Summary: pt has low pulse rate- LVMTCB**  Phone Note Call from Patient Call back at Home Phone [phone]   Caller: Patient Reason for Call: Talk to Nurse, Talk to Doctor Summary of Call: pt pulse was 38 about an hour ago and he was SOB and he want to talk to someone about Initial call taken by: Omer Jack,  September 26, 2010 12:58 PM  Follow-up for Phone Call        Patient walked to mailbox and became sob and diaphoretic. He feels okay now, just has a slight headache.  Patient has been staying well hydrated. Advised him to hold his Coreg dose for now.  BP:137/70 HR 37-44. Currently 128/69 HR 54.  Spoke with Dr. Johney Frame, we will have the patient go to the [facility] office for an EKG and will have our device rep. meet him there to check his device since patient has a pacer/ICD and the rate is set @ 60. I attempted to call patient back to schedule this but there is no VM set up. I will keep trying. Whitney Maeola Sarah RN  September 26, 2010 2:26 PM  Follow-up by: Whitney Maeola Sarah RN,  September 26, 2010 1:13 PM  Additional Follow-up for Phone Call Additional follow up Details #1::        Spoke with patient. He is still experiencing [place] and is somewhat diaphoretic. He would like to wait to make the appt. for a nurse visit in [place]. He will call back Monday. I advised him to go to the ER if he is feeling this bad since he is stating that his HR is low 40s and his pacer is set @ 60. He agrees. Whitney Maeola Sarah RN  September 26, 2010 3:12 PM  spoke w/pt and scheduled for 2pm for device check. Vella Kohler  September 29, 2010 8:56 AM  Additional Follow-up by: Hillis Range, MD,  September 28, 2010 8:06 PM    Additional Follow-up for Phone Call Additional follow up Details #2::    I wish he had kept his office appointment on Friday.  We need to interrogate his device to ensure that it is working appropriately.  He should have his device checked in our office on Monday.  If he feels any worse, he should  report to the ER. Follow-up by: Hillis Range, MD,  September 28, 2010 8:07 PM

## 2010-10-16 NOTE — Assessment & Plan Note (Signed)
Summary: PER MICHELLE FROM [facility] SAID TO ADD THIS DATE TO [facility]...   Visit Type:  Pacemaker check Primary Provider:  Dr. Samuel Jester   History of Present Illness: The patient presents today for routine electrophysiology followup.  He reports that over the past few weeks, he has had low blood pressure with symptoms of fatigue and dizziness.  He was taking lasix 60mg  daily but has decreased this to 40mg  daily.  He denies chest pain, shortness of breath, orthopnea, PND, lower extremity edema,  presyncope, syncope, or neurologic sequela. The patient is tolerating medications without difficulties and is otherwise without complaint today.   Preventive Screening-Counseling & Management  Alcohol-Tobacco     Smoking Status: quit  Current Medications (verified): 1)  Carvedilol 6.25 Mg Tabs (Carvedilol) .... One By Mouth Two Times A Day 2)  Bayer Aspirin 325 Mg Tabs (Aspirin) .... Take One Tablet Once Daily 3)  Crestor 20 Mg Tabs (Rosuvastatin Calcium) .... Take One Tablet Once Daily 4)  Lisinopril 2.5 Mg Tabs (Lisinopril) .... Take One Tablet Once Daily 5)  Oxycontin 80 Mg Xr12h-Tab (Oxycodone Hcl) .Marland Kitchen.. 1 Tab As Needed 6)  Folic Acid 1 Mg Tabs (Folic Acid) .... Take One Tablet Once Daily 7)  Furosemide 40 Mg Tabs (Furosemide) .... Take One Tablet Once Daily 8)  Endocet 10-325 Mg Tabs (Oxycodone-Acetaminophen) .... Take One Tablet Ever 4-6 Hours As Needed 9)  Gabapentin 300 Mg Caps (Gabapentin) .... Take 1 Capsule By Mouth Three Times A Day 10)  Fenofibrate 160 Mg Tabs (Fenofibrate) .... Take 1 Tablet By Mouth At Bedtime 11)  Viibryd 40 Mg Tabs (Vilazodone Hcl) .... Take 1 Tablet By Mouth Once A Day 12)  Valium 10 Mg Tabs (Diazepam) .... As Needed  Allergies (verified): 1)  ! Pcn  Comments:  Nurse/Medical Assistant: The patient's medications and allergies were reviewed with the patient and were updated in the Medication and Allergy Lists. Tammi Romine CMA (October 08, 2010 3:48  PM)  Past History:  Past Medical History: Reviewed history from 06/13/2009 and no changes required.  1. 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   2. Coronary artery disease with cardiac catheterization in 2005 showing a chronically-occluded right coronary artery and a borderline lesion in the ramus vessel.  Of note, he recently had a cardiac MRI, which showed decreased left ventricular systolic function with an ejection fraction of 34% and basal and midinferolateral walls were akinetic.   3. Hypertension.   4. Hyperlipidemia.   5. History of alcohol abuse  6. Depression.   7. Obesity.   8. Sleep apnea, on CPAP therapy.   9. COPD.   10.Osteoarthritis.   11.Degenerative joint disease.   12.Degenerative disk disease.   13.Gastroesophageal reflux disease.   Past Surgical History: Reviewed history from 06/13/2009 and no changes required.  1. Multiple prior back surgeries for chronic back pain and lumbar       fusion.   2. Umbilical hernia repair.   Social History: Reviewed history from 05/24/2009 and no changes required. Married  Tobacco Use - Former. - hx of heavy abuse, quit several yrs ago Alcohol Use - yes--Hx of heavy alcohol abuse- pt reports May 2010  sobriety of one month. He still admits to occasional binges.  Review of Systems       All systems are reviewed and negative except as listed in the HPI.   Vital Signs:  Patient profile:   65 year old male Height:  70 inches Weight:      219 pounds BMI:     31.54 Pulse rate:   61 / minute BP sitting:   88 / 61  (left arm) Cuff size:   large  Vitals Entered By: Fuller Plan CMA (October 08, 2010 3:48 PM)  Physical Exam  General:  Well developed, well nourished, in no acute distress. Head:  normocephalic and atraumatic Eyes:  PERRLA/EOM intact; conjunctiva and lids normal. Mouth:  dry OP Neck:  supple, flat JVP Chest Wall:  ICD pocket is well healed Lungs:  Clear bilaterally to auscultation and  percussion. Heart:  RRR, no m/r/g Abdomen:  Bowel sounds positive; abdomen soft and non-tender without masses, organomegaly, or hernias noted. No hepatosplenomegaly. Msk:  Back normal, normal gait. Muscle strength and tone normal. Extremities:  No clubbing or cyanosis. Neurologic:  Alert and oriented x 3. Skin:  Intact without lesions or rashes.    ICD Specifications Following MD:  Hillis Range, MD     Referring MD:  MCDOWELL ICD Vendor:  [employer]     ICD Model Number:  [id number]     ICD Serial Number:  [id number] ICD DOI:  06/18/2009     ICD Implanting MD:  Hillis Range, MD  Lead 1:    Location: RA     DOI: 06/18/2009     Model #: [id number]     Serial #: [id number]     Status: active Lead 2:    Location: RV     DOI: 06/18/2009     Model #: [id number]     Serial #: [id number]     Status: active  Indications::  ICM   ICD Follow Up Battery Voltage:  86% V     Charge Time:  9.1 seconds     Battery Est. Longevity:  6.5-7.9 yrs Underlying rhythm:  SR @ 69 ICD Dependent:  No       ICD Device Measurements Atrium:  Amplitude: 4.7 mV, Impedance: 300 ohms, Threshold: 0.5 V at 0.5 msec Right Ventricle:  Amplitude: 11.7 mV, Impedance: 460 ohms, Threshold: 1.0 V at 0.5 msec Shock Impedance: 52 ohms   Episodes MS Episodes:  32     Percent Mode Switch:  <1%     Coumadin:  No Shock:  0     ATP:  0     Nonsustained:  0     Atrial Therapies:  0 Atrial Pacing:  15%     Ventricular Pacing:  1%  Brady Parameters Mode DDDR     Lower Rate Limit:  60     Upper Rate Limit 115 PAV 225     Sensed AV Delay:  225  Tachy Zones VF:  200     VT:  169 (MONITOR)     Next Cardiology Appt Due:  12/30/2010 Tech Comments:  NORMAL DEVICE FUNCTION.  45 AMS EPISODES--LONGEST WAS 17 MIN 36 SECONDS.  MOST AMS WERE SINUS TACH--1:1 CONDUCTION.  NO CHANGES MADE. ROV IN 3 MTHS W/DEVICE CLINIC. Vella Kohler  October 08, 2010 4:18 PM MD Comments:  agree, mode switches appear to be for sinus tach  Impression &  Recommendations:  Problem # 1:  CHRONIC SYSTOLIC HEART FAILURE (ICD-428.22) appears dehydrated on exam, with low BP we will decrease lasix to 20mg  daily. follow-up with Dr Diona Browner   normal ICD function as above  Problem # 2:  CAD, NATIVE VESSEL (ICD-414.01) stable without symptoms of ischemia  Problem # 3:  MIXED HYPERLIPIDEMIA (ICD-272.2) stable His  updated medication list for this problem includes:    Fenofibrate 160 Mg Tabs (Fenofibrate) .Marland Kitchen... Take 1 tablet by mouth at bedtime  Problem # 4:  HYPOTENSION, UNSPECIFIED (ICD-458.9) hypertension is well controlled,  he is actually hypotensive at this time, likely due to ongoing dehydration we will decrease lasix to 20mg  daily follow-up with Dr Diona Browner  Patient Instructions: 1)  Follow up with Dr. Diona Browner on  [name], October 21, 2010 AT 8:40AM. 2)  Follow up with Krisin in our Device Clinic on FRIDAY, Jan 09, 2011 AT 9:15AM. 3)  Decrease Lasix (furosemide) to 20mg  by mouth once daily. 4)  Your physician has requested that you limit the intake of sodium (salt) in your diet to two grams daily. Please see [facility] handout.

## 2010-10-21 ENCOUNTER — Ambulatory Visit: Payer: PRIVATE HEALTH INSURANCE | Admitting: Cardiology

## 2010-10-28 NOTE — Cardiovascular Report (Signed)
Summary: Card Device Clinic/ [facility] SUMMARY  Card Device [facility]/ [facility] SUMMARY   Imported By: Dorise Hiss 10/21/2010 15:32:34  _____________________________________________________________________  External Attachment:    Type:   Image     Comment:   External Document

## 2010-11-07 ENCOUNTER — Emergency Department (HOSPITAL_COMMUNITY): Payer: No Typology Code available for payment source

## 2010-11-07 ENCOUNTER — Emergency Department (HOSPITAL_COMMUNITY)
Admission: EM | Admit: 2010-11-07 | Discharge: 2010-11-07 | Disposition: A | Discharge status: Home / Self Care | Payer: No Typology Code available for payment source | Attending: Emergency Medicine | Admitting: Emergency Medicine

## 2010-11-07 DIAGNOSIS — R059 Cough, unspecified: Secondary | ICD-10-CM | POA: Insufficient documentation

## 2010-11-07 DIAGNOSIS — J4489 Other specified chronic obstructive pulmonary disease: Secondary | ICD-10-CM | POA: Insufficient documentation

## 2010-11-07 DIAGNOSIS — R0602 Shortness of breath: Secondary | ICD-10-CM | POA: Insufficient documentation

## 2010-11-07 DIAGNOSIS — R5383 Other fatigue: Secondary | ICD-10-CM | POA: Insufficient documentation

## 2010-11-07 DIAGNOSIS — R5381 Other malaise: Secondary | ICD-10-CM | POA: Insufficient documentation

## 2010-11-07 DIAGNOSIS — R05 Cough: Secondary | ICD-10-CM | POA: Insufficient documentation

## 2010-11-07 DIAGNOSIS — J449 Chronic obstructive pulmonary disease, unspecified: Secondary | ICD-10-CM | POA: Insufficient documentation

## 2010-11-07 DIAGNOSIS — Z950 Presence of cardiac pacemaker: Secondary | ICD-10-CM | POA: Insufficient documentation

## 2010-11-07 DIAGNOSIS — E785 Hyperlipidemia, unspecified: Secondary | ICD-10-CM | POA: Insufficient documentation

## 2010-11-07 DIAGNOSIS — I10 Essential (primary) hypertension: Secondary | ICD-10-CM | POA: Insufficient documentation

## 2010-11-07 DIAGNOSIS — R0789 Other chest pain: Secondary | ICD-10-CM | POA: Insufficient documentation

## 2010-11-07 LAB — DIFFERENTIAL
Basophils Absolute: 0 10*3/uL (ref 0.0–0.1)
Basophils Relative: 1 % (ref 0–1)
Eosinophils Absolute: 0.2 10*3/uL (ref 0.0–0.7)
Eosinophils Relative: 6 % — ABNORMAL HIGH (ref 0–5)
Lymphocytes Relative: 50 % — ABNORMAL HIGH (ref 12–46)
Lymphs Abs: 1.9 10*3/uL (ref 0.7–4.0)
Monocytes Absolute: 0.5 10*3/uL (ref 0.1–1.0)
Monocytes Relative: 12 % (ref 3–12)
Neutro Abs: 1.2 10*3/uL — ABNORMAL LOW (ref 1.7–7.7)
Neutrophils Relative %: 32 % — ABNORMAL LOW (ref 43–77)

## 2010-11-07 LAB — CBC
HCT: 37.3 % — ABNORMAL LOW (ref 39.0–52.0)
Hemoglobin: 11.8 g/dL — ABNORMAL LOW (ref 13.0–17.0)
MCH: 29.2 pg (ref 26.0–34.0)
MCHC: 31.6 g/dL (ref 30.0–36.0)
MCV: 92.3 fL (ref 78.0–100.0)
Platelets: 310 10*3/uL (ref 150–400)
RBC: 4.04 MIL/uL — ABNORMAL LOW (ref 4.22–5.81)
RDW: 13.7 % (ref 11.5–15.5)
WBC: 3.8 10*3/uL — ABNORMAL LOW (ref 4.0–10.5)

## 2010-11-07 LAB — POCT CARDIAC MARKERS
CKMB, poc: 1.3 ng/mL (ref 1.0–8.0)
Myoglobin, poc: 115 ng/mL (ref 12–200)
Troponin i, poc: <0.05 ng/mL (ref 0.00–0.09)

## 2010-11-07 LAB — BASIC METABOLIC PANEL
CO2: 31 mEq/L (ref 19–32)
Glucose, Bld: 83 mg/dL (ref 70–99)
Potassium: 4.4 mEq/L (ref 3.5–5.1)
Sodium: 139 mEq/L (ref 135–145)

## 2010-11-07 LAB — PROTIME-INR
INR: 0.96 (ref 0.00–1.49)
Prothrombin Time: 13 seconds (ref 11.6–15.2)

## 2010-12-03 ENCOUNTER — Ambulatory Visit: Payer: PRIVATE HEALTH INSURANCE | Admitting: Cardiology

## 2010-12-09 ENCOUNTER — Encounter: Payer: Self-pay | Admitting: *Deleted

## 2010-12-09 LAB — TROPONIN I: Troponin I: 0.01 ng/mL (ref 0.00–0.06)

## 2010-12-09 LAB — BASIC METABOLIC PANEL
BUN: 12 mg/dL (ref 6–23)
GFR calc non Af Amer: >60 mL/min (ref >60)
Potassium: 3.7 mEq/L (ref 3.5–5.1)

## 2010-12-09 LAB — POCT I-STAT, CHEM 8
Calcium, Ion: 1.16 mmol/L (ref 1.12–1.32)
Chloride: 98 mEq/L (ref 96–112)
Glucose, Bld: 95 mg/dL (ref 70–99)
HCT: 43 % (ref 39.0–52.0)
Hemoglobin: 14.6 g/dL (ref 13.0–17.0)
TCO2: 32 mmol/L (ref 0–100)

## 2010-12-09 LAB — CBC
HCT: 35.4 % — ABNORMAL LOW (ref 39.0–52.0)
HCT: 39.1 % (ref 39.0–52.0)
Hemoglobin: 13.4 g/dL (ref 13.0–17.0)
MCHC: 34.2 g/dL (ref 30.0–36.0)
MCV: 91.8 fL (ref 78.0–100.0)
Platelets: 178 10*3/uL (ref 150–400)
RBC: 4.26 MIL/uL (ref 4.22–5.81)
WBC: 4.1 10*3/uL (ref 4.0–10.5)

## 2010-12-09 LAB — POCT CARDIAC MARKERS: Troponin i, poc: <0.05 ng/mL (ref 0.00–0.09)

## 2010-12-09 LAB — DIFFERENTIAL
Basophils Relative: 1 % (ref 0–1)
Eosinophils Absolute: 0.1 10*3/uL (ref 0.0–0.7)
Eosinophils Relative: 3 % (ref 0–5)
Monocytes Absolute: 0.6 10*3/uL (ref 0.1–1.0)
Monocytes Relative: 13 % — ABNORMAL HIGH (ref 3–12)

## 2010-12-09 LAB — CARDIAC PANEL(CRET KIN+CKTOT+MB+TROPI)
Relative Index: INVALID (ref 0.0–2.5)
Total CK: 71 U/L (ref 7–232)

## 2010-12-09 LAB — CK TOTAL AND CKMB (NOT AT ARMC): CK, MB: 1.2 ng/mL (ref 0.3–4.0)

## 2010-12-09 LAB — MAGNESIUM: Magnesium: 2.4 mg/dL (ref 1.5–2.5)

## 2010-12-10 LAB — BASIC METABOLIC PANEL
BUN: 11 mg/dL (ref 6–23)
BUN: 15 mg/dL (ref 6–23)
CO2: 32 mEq/L (ref 19–32)
Calcium: 8.6 mg/dL (ref 8.4–10.5)
Calcium: 9 mg/dL (ref 8.4–10.5)
Chloride: 100 mEq/L (ref 96–112)
Chloride: 102 mEq/L (ref 96–112)
Chloride: 98 mEq/L (ref 96–112)
Creatinine, Ser: 0.96 mg/dL (ref 0.4–1.5)
Creatinine, Ser: 0.99 mg/dL (ref 0.4–1.5)
GFR calc Af Amer: >60 mL/min (ref >60)
GFR calc Af Amer: >60 mL/min (ref >60)
GFR calc Af Amer: >60 mL/min (ref >60)
GFR calc non Af Amer: >60 mL/min (ref >60)
GFR calc non Af Amer: >60 mL/min (ref >60)
Glucose, Bld: 97 mg/dL (ref 70–99)
Glucose, Bld: 99 mg/dL (ref 70–99)
Potassium: 3.6 mEq/L (ref 3.5–5.1)
Potassium: 3.9 mEq/L (ref 3.5–5.1)
Sodium: 137 mEq/L (ref 135–145)
Sodium: 137 mEq/L (ref 135–145)
Sodium: 138 mEq/L (ref 135–145)
Sodium: 141 mEq/L (ref 135–145)

## 2010-12-10 LAB — MAGNESIUM
Magnesium: 2.2 mg/dL (ref 1.5–2.5)
Magnesium: 2.4 mg/dL (ref 1.5–2.5)

## 2010-12-10 LAB — CK TOTAL AND CKMB (NOT AT ARMC)
CK, MB: 1.6 ng/mL (ref 0.3–4.0)
Relative Index: 1.1 (ref 0.0–2.5)
Total CK: 142 U/L (ref 7–232)

## 2010-12-10 LAB — CARDIAC PANEL(CRET KIN+CKTOT+MB+TROPI)
Total CK: 109 U/L (ref 7–232)
Troponin I: 0.02 ng/mL (ref 0.00–0.06)

## 2010-12-10 LAB — CBC
HCT: 35.4 % — ABNORMAL LOW (ref 39.0–52.0)
Hemoglobin: 11.3 g/dL — ABNORMAL LOW (ref 13.0–17.0)
MCHC: 35.1 g/dL (ref 30.0–36.0)
MCV: 91.7 fL (ref 78.0–100.0)
MCV: 92.5 fL (ref 78.0–100.0)
Platelets: 214 10*3/uL (ref 150–400)
RBC: 3.86 MIL/uL — ABNORMAL LOW (ref 4.22–5.81)
RDW: 13.3 % (ref 11.5–15.5)
RDW: 13.4 % (ref 11.5–15.5)
WBC: 3.6 10*3/uL — ABNORMAL LOW (ref 4.0–10.5)
WBC: 4 10*3/uL (ref 4.0–10.5)

## 2010-12-10 LAB — DIFFERENTIAL
Basophils Absolute: 0 10*3/uL (ref 0.0–0.1)
Basophils Relative: 1 % (ref 0–1)
Eosinophils Relative: 3 % (ref 0–5)
Eosinophils Relative: 5 % (ref 0–5)
Lymphocytes Relative: 47 % — ABNORMAL HIGH (ref 12–46)
Lymphs Abs: 1.7 10*3/uL (ref 0.7–4.0)
Monocytes Absolute: 0.5 10*3/uL (ref 0.1–1.0)
Monocytes Absolute: 0.5 10*3/uL (ref 0.1–1.0)
Monocytes Relative: 13 % — ABNORMAL HIGH (ref 3–12)
Neutro Abs: 1.8 10*3/uL (ref 1.7–7.7)

## 2010-12-10 LAB — POCT CARDIAC MARKERS
CKMB, poc: 1.4 ng/mL (ref 1.0–8.0)
Troponin i, poc: <0.05 ng/mL (ref 0.00–0.09)

## 2010-12-10 LAB — HEPATIC FUNCTION PANEL
AST: 28 U/L (ref 0–37)
Bilirubin, Direct: <0.1 mg/dL (ref 0.0–0.3)

## 2010-12-10 LAB — PHOSPHORUS: Phosphorus: 3.5 mg/dL (ref 2.3–4.6)

## 2010-12-10 LAB — ETHANOL: Alcohol, Ethyl (B): <5 mg/dL (ref 0–10)

## 2010-12-10 LAB — BRAIN NATRIURETIC PEPTIDE: Pro B Natriuretic peptide (BNP): 180 pg/mL — ABNORMAL HIGH (ref 0.0–100.0)

## 2010-12-10 LAB — PROTIME-INR
INR: 1 (ref 0.00–1.49)
Prothrombin Time: 13.9 seconds (ref 11.6–15.2)

## 2010-12-15 ENCOUNTER — Inpatient Hospital Stay (HOSPITAL_COMMUNITY)
Admission: RE | Admit: 2010-12-15 | Discharge: 2010-12-19 | DRG: 644 | Disposition: A | Discharge status: Home / Self Care | Payer: No Typology Code available for payment source | Source: Other Acute Inpatient Hospital | Attending: Cardiovascular Disease | Admitting: Cardiovascular Disease

## 2010-12-15 DIAGNOSIS — I509 Heart failure, unspecified: Secondary | ICD-10-CM | POA: Diagnosis present

## 2010-12-15 DIAGNOSIS — F1010 Alcohol abuse, uncomplicated: Secondary | ICD-10-CM | POA: Diagnosis present

## 2010-12-15 DIAGNOSIS — E669 Obesity, unspecified: Secondary | ICD-10-CM | POA: Diagnosis present

## 2010-12-15 DIAGNOSIS — J449 Chronic obstructive pulmonary disease, unspecified: Secondary | ICD-10-CM | POA: Diagnosis present

## 2010-12-15 DIAGNOSIS — E785 Hyperlipidemia, unspecified: Secondary | ICD-10-CM | POA: Diagnosis present

## 2010-12-15 DIAGNOSIS — F329 Major depressive disorder, single episode, unspecified: Secondary | ICD-10-CM | POA: Diagnosis present

## 2010-12-15 DIAGNOSIS — I2582 Chronic total occlusion of coronary artery: Secondary | ICD-10-CM | POA: Diagnosis present

## 2010-12-15 DIAGNOSIS — M1990 Unspecified osteoarthritis, unspecified site: Secondary | ICD-10-CM | POA: Diagnosis present

## 2010-12-15 DIAGNOSIS — Z981 Arthrodesis status: Secondary | ICD-10-CM

## 2010-12-15 DIAGNOSIS — I2510 Atherosclerotic heart disease of native coronary artery without angina pectoris: Secondary | ICD-10-CM | POA: Diagnosis present

## 2010-12-15 DIAGNOSIS — F3289 Other specified depressive episodes: Secondary | ICD-10-CM | POA: Diagnosis present

## 2010-12-15 DIAGNOSIS — J4489 Other specified chronic obstructive pulmonary disease: Secondary | ICD-10-CM | POA: Diagnosis present

## 2010-12-15 DIAGNOSIS — E236 Other disorders of pituitary gland: Principal | ICD-10-CM | POA: Diagnosis present

## 2010-12-15 DIAGNOSIS — G8929 Other chronic pain: Secondary | ICD-10-CM | POA: Diagnosis present

## 2010-12-15 DIAGNOSIS — G4733 Obstructive sleep apnea (adult) (pediatric): Secondary | ICD-10-CM | POA: Diagnosis present

## 2010-12-15 DIAGNOSIS — I10 Essential (primary) hypertension: Secondary | ICD-10-CM | POA: Diagnosis present

## 2010-12-15 DIAGNOSIS — R079 Chest pain, unspecified: Secondary | ICD-10-CM

## 2010-12-15 DIAGNOSIS — Z95810 Presence of automatic (implantable) cardiac defibrillator: Secondary | ICD-10-CM

## 2010-12-15 DIAGNOSIS — Z87891 Personal history of nicotine dependence: Secondary | ICD-10-CM

## 2010-12-15 DIAGNOSIS — Z79899 Other long term (current) drug therapy: Secondary | ICD-10-CM

## 2010-12-15 DIAGNOSIS — M549 Dorsalgia, unspecified: Secondary | ICD-10-CM | POA: Diagnosis present

## 2010-12-15 DIAGNOSIS — I428 Other cardiomyopathies: Secondary | ICD-10-CM | POA: Diagnosis present

## 2010-12-15 DIAGNOSIS — Z7982 Long term (current) use of aspirin: Secondary | ICD-10-CM

## 2010-12-15 DIAGNOSIS — I5022 Chronic systolic (congestive) heart failure: Secondary | ICD-10-CM | POA: Diagnosis present

## 2010-12-15 DIAGNOSIS — Z880 Allergy status to penicillin: Secondary | ICD-10-CM

## 2010-12-15 DIAGNOSIS — K219 Gastro-esophageal reflux disease without esophagitis: Secondary | ICD-10-CM | POA: Diagnosis present

## 2010-12-15 DIAGNOSIS — I9589 Other hypotension: Secondary | ICD-10-CM | POA: Diagnosis present

## 2010-12-16 ENCOUNTER — Ambulatory Visit: Payer: Self-pay | Admitting: Cardiology

## 2010-12-16 DIAGNOSIS — I2510 Atherosclerotic heart disease of native coronary artery without angina pectoris: Secondary | ICD-10-CM

## 2010-12-16 DIAGNOSIS — I059 Rheumatic mitral valve disease, unspecified: Secondary | ICD-10-CM

## 2010-12-16 LAB — BASIC METABOLIC PANEL
CO2: 32 mEq/L (ref 19–32)
Calcium: 9.1 mg/dL (ref 8.4–10.5)
Glucose, Bld: 121 mg/dL — ABNORMAL HIGH (ref 70–99)
Potassium: 4.5 mEq/L (ref 3.5–5.1)
Sodium: 144 mEq/L (ref 135–145)

## 2010-12-16 LAB — LIPID PANEL
HDL: 46 mg/dL (ref >39)
Total CHOL/HDL Ratio: 2.5 RATIO
Triglycerides: 59 mg/dL (ref <150)
VLDL: 12 mg/dL (ref 0–40)

## 2010-12-16 LAB — BRAIN NATRIURETIC PEPTIDE: Pro B Natriuretic peptide (BNP): 168 pg/mL — ABNORMAL HIGH (ref 0.0–100.0)

## 2010-12-17 LAB — BASIC METABOLIC PANEL
Calcium: 8.8 mg/dL (ref 8.4–10.5)
Creatinine, Ser: 1.37 mg/dL (ref 0.4–1.5)
GFR calc Af Amer: >60 mL/min (ref >60)

## 2010-12-17 LAB — CBC
MCH: 28.7 pg (ref 26.0–34.0)
MCHC: 31.1 g/dL (ref 30.0–36.0)
Platelets: 233 10*3/uL (ref 150–400)
RDW: 14.3 % (ref 11.5–15.5)

## 2010-12-18 ENCOUNTER — Inpatient Hospital Stay (HOSPITAL_COMMUNITY): Payer: No Typology Code available for payment source

## 2010-12-18 LAB — URINALYSIS, MICROSCOPIC ONLY
Bilirubin Urine: NEGATIVE
Glucose, UA: NEGATIVE mg/dL
Hgb urine dipstick: NEGATIVE
Ketones, ur: NEGATIVE mg/dL
Leukocytes, UA: NEGATIVE
Nitrite: NEGATIVE
Protein, ur: NEGATIVE mg/dL
Specific Gravity, Urine: 1.013 (ref 1.005–1.030)
Urobilinogen, UA: 0.2 mg/dL (ref 0.0–1.0)
pH: 7.5 (ref 5.0–8.0)

## 2010-12-18 LAB — ACTH STIMULATION, 3 TIME POINTS
Cortisol, 30 Min: 11.7 ug/dL
Cortisol, 60 Min: 14.7 ug/dL
Cortisol, Base: 1 ug/dL

## 2010-12-18 LAB — T4, FREE: Free T4: 0.94 ng/dL (ref 0.80–1.80)

## 2010-12-18 LAB — TESTOSTERONE: Testosterone: 480.36 ng/dL (ref 250–890)

## 2010-12-18 MED ORDER — IOHEXOL 300 MG/ML  SOLN
100.0000 mL | Freq: Once | INTRAMUSCULAR | Status: AC | PRN
  Administered 2010-12-18: 100 mL via INTRAVENOUS

## 2010-12-19 DIAGNOSIS — I959 Hypotension, unspecified: Secondary | ICD-10-CM

## 2010-12-19 LAB — TESTOSTERONE, % FREE: Testosterone-% Free: 1.5 % — ABNORMAL LOW (ref 1.6–2.9)

## 2010-12-19 LAB — TESTOSTERONE, FREE: Testosterone, Free: 73 pg/mL (ref 47.0–244.0)

## 2010-12-25 ENCOUNTER — Ambulatory Visit: Payer: No Typology Code available for payment source | Admitting: Adult Health

## 2010-12-28 NOTE — Discharge Summary (Signed)
NAMEMarland Kitchen  JAMEON, DELLER NO.:  [id number]  MEDICAL RECORD NO.:  [id number]           PATIENT TYPE:  I  LOCATION:  6525                         FACILITY:  [facility]  PHYSICIAN:  Turner Baillie M. [place], M.D.  DATE OF BIRTH:  1945/09/03  DATE OF ADMISSION:  12/15/2010 DATE OF DISCHARGE:  12/19/2010                              DISCHARGE SUMMARY   PRIMARY CARDIOLOGIST:  Jonelle Sidle, MD  ELECTROPHYSIOLOGIST:  Hillis Range, MD  CONSULTING ENDOCRINOLOGIST:  Reather Littler, MD  DISCHARGE DIAGNOSES: 1. 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     a.     Endocrinology consult with workup by Dr. Lucianne Muss, initiated on      Cortef 20 mg p.o. q.a.m. and 10 mg p.o. q. 4 p.m.  Followup as an      outpatient in 3 weeks with Dr. Lucianne Muss. 2. Hypotension (secondary to isolated adrenocorticotropic hormone     (corticotropin) deficiency), systolic blood pressure ranging from     90s-110s.     a.     Off all antihypertensives/congestive heart failure meds.     b.     Cardiac catheterization, December 16, 2010:  Single-vessel      occlusive coronary artery disease involving the proximal right      coronary artery with excellent collateral flow.  Borderline      proximal intermediate lesion, unchanged from June 2005.     c.     A 2-D echocardiogram, December 16, 2010:  Left ventricular      cavity size normal, mild left ventricular hypertrophy, left      ventricular ejection fraction of 45-50% with diffuse hypokinesis      and grade 1 diastolic dysfunction (ejection fraction stable to      improved).  Mild mitral regurgitation.  Mildly dilated left      atrium.  Trivial pericardial effusion.  SECONDARY DIAGNOSES: 1. Coronary artery disease (please see cardiac cath this admission     under discharge diagnoses #2). 1. Chronic systolic heart failure secondary to mixed cardiomyopathy.     a.     Prior ejection fraction 30-35%.  See most recent      echocardiogram results under  discharge diagnosis #2.     b.     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 [employer] DR), June 18, 2009. 2. Hypertension (hypotensive this admission). 3. Dyslipidemia. 4. History of ethyl alcohol abuse. 5. Depression. 6. Obesity. 7. Obstructive sleep apnea (noncompliance with continuous positive     airway pressure). 8. Chronic obstructive pulmonary disease/history of tobacco abuse. 9. Degenerative joint disease. 10.Gastroesophageal reflux disease. 11.Chronic back pain.  PAST SURGICAL HISTORY: 1. Multiple back surgeries and lumbar fusion. 2. Umbilical hernia repair.  ALLERGIES AND INTOLERANCES:  PENICILLIN (hives).  PROCEDURES: 1. EKG, December 16, 2010:  Normal sinus rhythm with frequent premature     ventricular complexes and premature atrial contractions.  Mild T-     wave inversion in inferior leads with normal axis, no evidence of     hypertrophy, QRS 96, and QTC of  439.  No significant change from     tracing completed on Jan 20, 2009. 2. Cardiac catheterization, December 16, 2010:  Please see discharge     diagnoses #2 subsection b. 3. A 2-D echocardiogram, December 16, 2010:  Please see discharge     diagnoses #2 subsection c. 4. A CT of the head with and without contrast, December 18, 2010:  No     significant intracranial abnormality.  Negative for pituitary mass     lesion.  Intracranial atherosclerotic disease.  HISTORY OF PRESENT ILLNESS:  Mr. Padin is a 65 year old gentleman with the above-noted complex medical history who presented to [facility] complaining of progressive weakness and chest discomfort.  The patient endorses longstanding weakness, perhaps with some recent exacerbation, noted to be hypertensive recently with systolic BP down into the 80s.  On the date of presentation, he developed chest discomfort while walking to his mailbox which resolved with rest.  He did not take any  nitroglycerin.  He reports compliance with all his medications and low-sodium diet.  He has not been weighing himself daily, however.  He subsequently presented to [facility] for further eval and ruled out for MI with serial negative cardiac enzymes and EKG was unchanged from prior tracing.  Review of telemetry showed atrial sensing with ventricular pacing.  Due to the concern that the patient's symptoms could be related to worsening coronary disease or potentially worsening systolic CHF, he was transferred to [facility] for further evaluation.  HOSPITAL COURSE:  The patient was admitted and underwent cardiac catheterization and 2-D echocardiogram which showed stable to improved condition.  He continued to be hypotensive and a cortisol level was checked which was significantly depressed.  Endocrinology was consulted and after full workup including ACTH, stress test, and CT of the head with and without contrast (MRI not possible due to ICD), the patient was diagnosed with isolated ACTH deficiency.  He was initiated on hydrocortisone 10 mg two tablets p.o. q.a.m. and one tablet p.o. q. 4 p.m. and plan will be to have him follow up with his new endocrinologist, Dr. Lucianne Muss, in 3 weeks.  The patient's antihypertensive/CHF meds have been held this admission due to the hypotension and will remain held until reviewed by his primary cardiologist, Dr. Nona Dell and nurse practitioner, Joni Reining, on early followup on December 25, 2010, at 1:20 p.m.  At the time of discharge, the patient received his new medication list, prescriptions, followup instructions, post cath instructions, and all questions and concerns were addressed prior to leaving the hospital.  DISCHARGE LABORATORY DATA:  WBC is 3.9, HGB 10.6, HCT 34.1, PLT count 233.  Sodium 142, potassium 4.6, chloride 104, bicarb 16, creatinine 1.37, glucose 115.  Free T4 is 0.94, testosterone 40.36.  Baseline cortisol 1.0, cortisol  after 30 minutes 11.7, cortisol after 60 minutes 14.7.  Urinalysis within normal limits.  FOLLOWUP PLANS AND APPOINTMENTS:  Please see hospital course.  DISCHARGE MEDICATIONS: 1. Hydrocortisone 10 mg two tablets p.o. q.a.m. and one tablet p.o. q.     4 p.m. 2. Enteric-coated aspirin 325 mg p.o. daily. 3. Crestor 20 mg p.o. nightly. 4. Diazepam 10 mg one tablet p.o. b.i.d. p.r.n. 5. Indocin 10/325 mg one tablet p.o. q.4 h. p.r.n. 6. Effexor 150 mg one tablet daily. 7. Fenofibrate 160 mg p.o. nightly. 8. Folic acid 1 mg p.o. daily. 9. Gabapentin 300 mg one capsule p.o. t.i.d. 10.Oxycodone CR 80 mg one tablet p.o. t.i.d. p.r.n.  DURATION OF DISCHARGE ENCOUNTER:  Including  physician time was 40 minutes.     Jarrett Ables, PAC   ______________________________ Ayaan Ringle M. [place], M.D.    MS/MEDQ  D:  12/19/2010  T:  12/19/2010  Job:  [id number]  cc:   Jonelle Sidle, MD Reather Littler, M.D. Bettey Mare. Lyman Bishop, NP Hillis Range, MD  Electronically Signed by Jarrett Ables PAC on 12/26/2010 02:19:27 PM Electronically Signed by Marykathryn Carboni [place] M.D. on 12/28/2010 12:39:47 PM

## 2010-12-28 NOTE — Cardiovascular Report (Signed)
  [name], [name] NO.:  [id number]  MEDICAL RECORD NO.:  [id number]           PATIENT TYPE:  I  LOCATION:  6525                         FACILITY:  [facility]  PHYSICIAN:  Peter M. [place], M.D.  DATE OF BIRTH:  April 01, 1946  DATE OF PROCEDURE:  12/16/2010 DATE OF DISCHARGE:                           CARDIAC CATHETERIZATION   INDICATIONS FOR PROCEDURE:  A 65 year old white male with known history of coronary artery disease and mixed cardiomyopathy presents with symptoms of increasing fatigue and atypical chest pain.  PROCEDURE:  Left heart catheterization and coronary angiography.  Left ventricular angiography was not performed due to concern over elevated creatinine.  Access via the right femoral artery using standard Seldinger technique.  EQUIPMENT:  5-French 4 cm right and left Judkins catheter, 5-French pigtail catheter, 5-French arterial sheath.  MEDICATIONS:  Local anesthesia 1% Xylocaine, Versed 2 mg IV, contrast 60 mL of Omnipaque.  HEMODYNAMIC DATA:  Aortic pressure was 123/75 with a mean of 94 mmHg, left ventricle pressure is 116 with EDP of 21 mmHg.  ANGIOGRAPHIC DATA:  The left coronary artery arises and distributes normally.  The left main coronary is heavily calcified distally with a 30% distal narrowing.  The left anterior descending artery is heavily calcified throughout the proximal vessel and has a 30% ostial stenosis.  There is a moderate-sized intermediate branch, which is also calcified. There is a 50-60% stenosis proximally.  The left circumflex coronary is small and without significant disease.  The right coronary is a large dominant vessel.  It is occluded proximally with excellent bridging, collaterals right-to-right and left- to-right to the distal right coronary artery.  FINAL INTERPRETATION: 1. Single-vessel occlusive coronary artery disease involving the     proximal right coronary with excellent collateral flow. 2.  Borderline proximal intermediate lesion, which is unchanged from     June 2005.  PLAN:  We would obtain echocardiogram to assess his LV function.  Given his elevated creatinine, will be hydrated overnight.  If renal function is stable in the morning, we will plan on discharge.          ______________________________ Peter M. [place], M.D.     PMJ/MEDQ  D:  12/16/2010  T:  12/16/2010  Job:  [id number]  cc:   Hillis Range, MD Jonelle Sidle, MD  Electronically Signed by PETER [place] M.D. on 12/28/2010 12:39:43 PM

## 2011-01-08 ENCOUNTER — Encounter: Payer: Self-pay | Admitting: Cardiology

## 2011-01-09 ENCOUNTER — Encounter: Payer: Self-pay | Admitting: *Deleted

## 2011-01-09 ENCOUNTER — Ambulatory Visit (INDEPENDENT_AMBULATORY_CARE_PROVIDER_SITE_OTHER): Payer: No Typology Code available for payment source | Admitting: *Deleted

## 2011-01-09 ENCOUNTER — Ambulatory Visit (INDEPENDENT_AMBULATORY_CARE_PROVIDER_SITE_OTHER): Payer: No Typology Code available for payment source | Admitting: Physician Assistant

## 2011-01-09 ENCOUNTER — Encounter: Payer: Self-pay | Admitting: Cardiology

## 2011-01-09 DIAGNOSIS — I429 Cardiomyopathy, unspecified: Secondary | ICD-10-CM

## 2011-01-09 DIAGNOSIS — I5022 Chronic systolic (congestive) heart failure: Secondary | ICD-10-CM

## 2011-01-09 DIAGNOSIS — E782 Mixed hyperlipidemia: Secondary | ICD-10-CM

## 2011-01-09 DIAGNOSIS — I2510 Atherosclerotic heart disease of native coronary artery without angina pectoris: Secondary | ICD-10-CM

## 2011-01-09 DIAGNOSIS — I495 Sick sinus syndrome: Secondary | ICD-10-CM

## 2011-01-09 MED ORDER — METOPROLOL SUCCINATE ER 25 MG PO TB24: 25.0000 mg | ORAL_TABLET | Freq: Every day | ORAL | Status: DC

## 2011-01-09 MED ORDER — ASPIRIN EC 81 MG PO TBEC: 81.0000 mg | DELAYED_RELEASE_TABLET | Freq: Every day | ORAL | Status: AC

## 2011-01-09 NOTE — Patient Instructions (Signed)
Your physician wants you to follow-up in: 6 months. You will receive a reminder letter in the mail one-two months in advance. If you don't receive a letter, please call our office to schedule the follow-up appointment. Decrease Aspirin to 81mg  daily. Start Toprol XL (metoprolol) 25mg  daily.

## 2011-01-09 NOTE — Assessment & Plan Note (Signed)
Pt is euvolemic by history and physical presentation. Will not resume Lasix for now, given recent presentation with significant hypotension, and reassess at time of next OV.

## 2011-01-09 NOTE — Assessment & Plan Note (Signed)
Pt is scheduled of ICD check today. Will f/u with Dr Johney Frame, as scheduled.

## 2011-01-09 NOTE — Assessment & Plan Note (Signed)
Well controlled by recent assessment, with LDL 55. Continue current Crestor dose.

## 2011-01-09 NOTE — Assessment & Plan Note (Addendum)
Stable on current medication regimen, with no recurrent CP. Will resume beta blocker (previously on Coreg), now that hypotension has resolved, but will switch to Toprol XL 25 mg daily. This is for better suppression of noted PVCs, as well as to minimize effect on BP. Will not rechallenge with ACE I at present, pending stabilization of his BP over the longterm. Decrease ASA to 81 daily.

## 2011-01-09 NOTE — Progress Notes (Signed)
icd check in clinic  

## 2011-01-09 NOTE — Progress Notes (Signed)
Clinical Summary Mr. [name] is a 65 y.o.male presents for a post hospital followup. He has not been seen for general cardiology care since 2010, although he has had visits with Dr. Johney Frame for device followup. Recent admission  to [facility] noted in April, following initial presentation here at [facility], and referral to [place] for evaluatin of weakness, in setting of significant hypotension, and CP. Serial cardiac markers were normal. He was diagnosed at that time with adrenocorticotropic hormone deficiency, seen in consultation by Dr. Lucianne Muss, and initiated on Cortef. All of his antihypertensives/CHF medications were discontinued related to hypotension.  He underwent cardiology evaluation including a cardiac catheterization that demonstrated no need for revascularization, showing occlusion of the RCA with good collateral flow, otherwise nonobstructive disease. A 2D echo yielded EF 45-50%, with mild MR.  Lab work from April showed hemoglobin 10.6, platelets 233, sodium 142, potassium 4.6, BUN 16, creatinine 1.3, BNP 168, TSH 0.5. LDL 55.   He has since followed up with Dr Lucianne Muss, and is reporting increased energy and better BP readings. He denies any CP or symptoms suggestive of decompensated CHF. He does report occasional palpitations.  Allergies  Allergen Reactions  . Penicillins     REACTION: hives    Current outpatient prescriptions:aspirin (BAYER ASPIRIN) 325 MG tablet, Take 325 mg by mouth daily.  , Disp: , Rfl: ;  diazepam (VALIUM) 10 MG tablet, Take 10 mg by mouth every 6 (six) hours as needed.  , Disp: , Rfl: ;  fenofibrate 160 MG tablet, Take 160 mg by mouth daily.  , Disp: , Rfl: ;  folic acid (FOLVITE) 1 MG tablet, Take 1 mg by mouth daily.  , Disp: , Rfl:  gabapentin (NEURONTIN) 300 MG capsule, Take 600 mg by mouth 3 (three) times daily. , Disp: , Rfl: ;  hydrocortisone (CORTEF) 20 MG tablet, Take 20 mg by mouth every morning. And 10mg  and night , Disp: , Rfl: ;  oxyCODONE (OXYCONTIN) 80 MG 12  hr tablet, Take 80 mg by mouth every 12 (twelve) hours.  , Disp: , Rfl: ;  oxyCODONE-acetaminophen (PERCOCET) 10-325 MG per tablet, Take 1 tablet by mouth every 4 (four) hours as needed.  , Disp: , Rfl:  rosuvastatin (CRESTOR) 20 MG tablet, Take 20 mg by mouth daily.  , Disp: , Rfl: ;  venlafaxine (EFFEXOR-XR) 150 MG 24 hr capsule, Take 150 mg by mouth daily.  , Disp: , Rfl: ;  DISCONTD: carvedilol (COREG) 6.25 MG tablet, Take 6.25 mg by mouth 2 (two) times daily with a meal.  , Disp: , Rfl: ;  DISCONTD: furosemide (LASIX) 20 MG tablet, Take 20 mg by mouth daily.  , Disp: , Rfl:  DISCONTD: lisinopril (PRINIVIL,ZESTRIL) 2.5 MG tablet, Take 2.5 mg by mouth daily.  , Disp: , Rfl: ;  DISCONTD: Vilazodone HCl 40 MG TABS, Take by mouth daily.  , Disp: , Rfl:   Past Medical History  Diagnosis Date  . Essential hypertension, benign   . Mixed hyperlipidemia   . Cardiomyopathy     Mixed, LVEF 35% up to 50%  . Coronary atherosclerosis of native coronary artery     Occluded RCA with good collaterals  . H/O alcohol abuse   . Depression   . Obesity   . Sleep apnea     Noncompliance with CPAP therapy  . COPD (chronic obstructive pulmonary disease)   . Osteoarthritis   . DJD (degenerative joint disease)   . DDD (degenerative disc disease)   . GERD (gastroesophageal  reflux disease)   . ACTH deficiency     Dr. Lucianne Muss    Past Surgical History  Procedure Date  . Back surgery     Multiple back surgeries for chronic back pain and lumbar fusion  . Umbilical hernia repair     Family History  Problem Relation Age of Onset  . Coronary artery disease      Social History Mr. Buda reports that he quit smoking about 13 years ago. His smoking use included Cigarettes. He has a 36 pack-year smoking history. He has never used smokeless tobacco. Mr. Mollett reports that he drinks alcohol.  Review of Systems The patient denies fatigue, malaise, fever, weight gain/loss, vision loss, decreased hearing,  hoarseness, chest pain, shortness of breath, prolonged cough, wheezing, sleep apnea, coughing up blood, abdominal pain, blood in stool, nausea, vomiting, diarrhea, heartburn, incontinence, blood in urine, muscle weakness, joint pain, leg swelling, rash, skin lesions, headache, fainting, dizziness, depression, anxiety, enlarged lymph nodes, easy bruising or bleeding, and environmental allergies.      Physical Examination Filed Vitals:   01/09/11 1416  BP: 120/77  Pulse: 41   General: Well-developed, well-nourished in no distress head: Normocephalic and atraumatic eyes PERRLA/EOMI intact, conjunctiva and lids normal nose: No deformity or lesions mouth normal dentition, normal posterior pharynx neck: Supple, no JVD.  No masses, thyromegaly or abnormal cervical nodes lungs: Normal breath sounds bilaterally without wheezing.  Normal percussion heart: regular rate and rhythm with normal S1 and S2, no S3 or S4.  PMI is normal.  No pathological murmurs abdomen: Normal bowel sounds, abdomen is soft and nontender without masses, organomegaly or hernias noted.  No hepatosplenomegaly musculoskeletal: Back normal, normal gait muscle strength and tone normal pulsus: Pulse is normal in all 4 extremities Extremities: Trace peripheral pitting edema neurologic: Alert and oriented x 3 skin: Intact without lesions or rashes cervical nodes: No significant adenopathy psychologic: Normal affect   ECG  NSR at 82 bpm with ventricular bigeminy   Studies Cardiac catheterization 12/16/2010: ANGIOGRAPHIC DATA:  The left coronary artery arises and distributes   normally.  The left main coronary is heavily calcified distally with a   30% distal narrowing.      The left anterior descending artery is heavily calcified throughout the   proximal vessel and has a 30% ostial stenosis.      There is a moderate-sized intermediate branch, which is also calcified.   There is a 50-60% stenosis proximally.      The  left circumflex coronary is small and without significant disease.      The right coronary is a large dominant vessel.  It is occluded   proximally with excellent bridging, collaterals right-to-right and left-   to-right to the distal right coronary artery.  Echocardiogram 12/16/2010: - Left ventricle: The cavity size was normal. Wall thickness was     increased in a pattern of mild LVH. Systolic function was mildly     reduced. The estimated ejection fraction was in the range of 45%     to 50%. Diffuse hypokinesis. Doppler parameters are consistent     with abnormal left ventricular relaxation (grade 1 diastolic     dysfunction).   - Mitral valve: Mild regurgitation.   - Left atrium: The atrium was mildly dilated.   - Pericardium, extracardiac: A trivial pericardial effusion was     identified.  Problem List and Plan

## 2011-01-13 NOTE — Consult Note (Signed)
[name], [name]               ACCOUNT NO.:  [id number]   MEDICAL RECORD NO.:  [id number]          PATIENT TYPE:  INP   LOCATION:  5502                         FACILITY:  [facility]   PHYSICIAN:  Therisa Doyne, MD    DATE OF BIRTH:  July 18, 1946   DATE OF CONSULTATION:  DATE OF DISCHARGE:                                 CONSULTATION   REFERRING PHYSICIAN:  [facility].   [facility] was asked to see [name] at the request of  [facility] for evaluation of confusion, weakness and chest  discomfort.   CHIEF COMPLAINT:  Confusion, weakness and chest discomfort.   HISTORY OF PRESENT ILLNESS:  A 65 year old white male with a past  medical history significant for a mixed cardiomyopathy with an ejection  fraction of 34%, history of chronic alcoholism but states that he has  been sober for 1 month, and coronary artery disease, who presents for  evaluation of weakness, bradycardia, chest pain and confusion.  Historically, the patient has a history of a remote myocardial  infarction.  More recently he underwent a cardiac catheterization in  2005 which showed a chronically-occluded right coronary artery and a  borderline lesion in the ramus vessel.  His left ventricular function  was estimated at 35%.  Subsequently, he underwent a Cardiolite stress  test which revealed no evidence of inducible ischemia.  More recently  this past month he was admitted for evaluation of some further symptoms  of fatigue and chest discomfort.  He underwent a cardiac MRI, which an  revealed ejection fraction of 34% with greater than 75% thickness scar  in the basal mid inferior lateral left ventricle and akinesis of that  segment.  Global hypokinesis was also noted.  The patient was discharged  to home on optimal medical therapy with aspirin, beta blocker and statin  therapy.  He was seen and evaluated for consideration of ICD; however,  at that time he declined ICD.  He was seen in  the cardiology clinic on  May 20, where his Coreg dose was increased to 6.25 b.i.d.  He also was  initiated on Aricept by his primary care Bianey Tesoro.   Since having these medication changes of Coreg and Aricept, the patient  reports 2-day history of confusion and weakness.  He notes intermittent  left-sided chest discomfort as well as bilateral elbow pain associated  with eye pain.  He took his pulse and at times he reported that his  pulse was in the 40s and his systolic blood pressure was in the 80s.  He  states that this is fairly normal for him.  Because of these symptoms,  he was seen and evaluated in the emergency department, where it was felt  that he would benefit from an admission for observation of rule-out of  acute coronary syndrome and monitoring on telemetry.   REVIEW OF SYSTEMS:  All systems were reviewed and are negative except as  mentioned above in the history of present illness.   PAST MEDICAL HISTORY:  1. 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  2. Coronary artery disease with  cardiac catheterization in 2005      showing a chronically-occluded right coronary artery and a      borderline lesion in the ramus vessel.  Of note, he recently had a      cardiac MRI, which showed decreased left ventricular systolic      function with an ejection fraction of 34% and basal and      midinferolateral walls were akinetic.  3. Hypertension.  4. Hyperlipidemia.  5. History of alcohol abuse; however, the patient he has been sober      for the past 1 month.  6. Depression.  7. Obesity.  8. Sleep apnea, on CPAP therapy.  9. COPD.  10.Osteoarthritis.  11.Degenerative joint disease.  12.Degenerative disk disease.  13.Gastroesophageal reflux disease.   PAST SURGICAL HISTORY:  1. Multiple prior back surgeries for chronic back pain and lumbar      fusion.  2. Umbilical hernia repair.   ALLERGIES:  PENICILLIN.   SOCIAL HISTORY:  The patient lives near [place]  with his spouse.  He  has a history of chronic tobacco use but quit several years ago.  He has  an extensive alcohol history but states that he quit approximately a  month ago.   FAMILY HISTORY:  Positive for coronary artery disease.   MEDICATIONS:  1. Coreg 6.25 mg b.i.d.  This was recently increased on May 20.  2. Aricept daily.  This was recently initiated.  The patient cannot      tell me the exact dose.  3. Aspirin 325 mg daily.  4. Crestor 10 mg daily.  5. Potassium chloride 10 mEq daily.  6. Percocet 10/325 mg q.i.d.  7. Lisinopril 2.5 mg daily.  8. OxyContin 80 mg q.8 h.  9. Omeprazole.  10.Effexor XR 150 mg daily.  11.Lasix 60 mg daily.   PHYSICAL EXAMINATION:  VITAL SIGNS:  Temperature afebrile.  Blood  pressure 106/81, pulse 60, respirations 18, oxygen saturation 98% on 2 L  nasal cannula.  GENERAL:  In no acute stress.  HEENT:  Normocephalic, atraumatic.  Pupils equal, round and reactive to  light and accommodation.  Extraocular movements are intact.  NECK:  Supple.  No lymphadenopathy, no jugular venous distention, no  masses.  CARDIOVASCULAR:  Regular rate and rhythm with frequent extrasystoles.  No murmurs, rubs or gallops.  CHEST:  Clear to auscultation bilaterally.  ABDOMEN:  Positive bowel sounds, soft, nontender, nondistended.  EXTREMITIES:  No clubbing, cyanosis, or edema.  Dorsalis pedis pulses 2+  bilaterally.  SKIN:  No rashes.  BACK:  No CVA tenderness.  NEUROLOGIC:  There is a left facial droop which is old, consistent with  a prior history of Bell's palsy.   PERTINENT DATA:  1. EKG from May 23 shows normal sinus rhythm at 89 beats per minute      with frequent premature ventricular contractions with compensatory      pauses after the premature ventricular beats.  No acute ST or T-      wave changes.  2. CBC normal and BMP normal.  3. Troponin x2 has been negative.  CK 75, CK-MB 1.2.  4. Chest x-ray:  COPD with chronic left basilar  atelectasis.   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  He is status  post myocardial infarction.  He has a history of hypertension and  hyperlipidemia, who presents for evaluation of generalized weakness,  confusion, intermittent chest pain, bradycardia and hypotension.   1. Weakness, confusion and chest pain.  Overall, his symptoms are very      nonspecific and somewhat atypical for an acute coronary syndrome.      He has no active signs of ischemia on his EKGs and his first set of      cardiac enzymes is negative.  I think this is unlikely to be an      acute coronary syndrome.  We would recommend continuing to cycle      cardiac enzymes.  We would continue him on his current medical      therapy, which includes aspirin, Coreg 6.25 mg b.i.d., low-dose ACE      inhibitor with lisinopril 2.5 mg daily, and Crestor 10 mg daily.      If he rules out, I do not think there is any need for an inpatient      stress test.  He has had stable coronary disease over the past      several years; however, he should have followup in the cardiology      clinic and if he has persistent symptoms, then I would consider a      nuclear stress test.  Would also check other metabolic causes of      weakness and confusion such as a TSH and cortisol level.   1. Bradycardia and hypotension.  This appears to be chronic.  The      [facility] have decreased his Coreg to 3.125 mg b.i.d.      This is reasonable to do this; however, he has frequent premature      ventricular contractions on EKG and I think that some of these PVCs      may be accounting for his reportedly low heart rate.  I would      actually recommend increasing his Coreg back to 6.25 mg b.i.d. as      this may help with some of his PVCs.  I do not think that the      bradycardia  alone is accounting for his weakness, and I think there      are other multiple etiologies which could include polypharmacy, as      the patient is on hefty doses of narcotics including OxyContin 80      mg q.8 h. as well as q.i.d. Percocet.  We will check a metabolic      workup for causes of weakness as he does have a reported history of      bradycardia at home, and we will monitor the patient on telemetry.      I think that he would benefit from an outpatient event monitor to      ensure that there are not any malignant arrhythmias.   1. Systolic congestive heart failure with ejection fraction of 34%.      He appears to be in a euvolemic, well-perfused state.  I would      continue him on his medical regimen of Coreg and I would increase      to 6.25 mg b.i.d.  Continue lisinopril.   Thank you for allowing [place] to participate in this patient's care.   Cardiology will be following the patient.  Please feel free to  contact me if you have any further questions.      Therisa Doyne, MD  Electronically Signed     SJT/MEDQ  D:  01/21/2009  T:  01/21/2009  Job:  [id number]

## 2011-01-13 NOTE — Consult Note (Signed)
[name], [name] NO.:  [id number]   MEDICAL RECORD NO.:  [id number]          PATIENT TYPE:  INP   LOCATION:  2603                         FACILITY:  [facility]   PHYSICIAN:  Vernice Jefferson, MD          DATE OF BIRTH:  1945-11-07   DATE OF CONSULTATION:  12/24/2008  DATE OF DISCHARGE:                                 CONSULTATION   REASON FOR CONSULTATION:  This patient being seen in consultation at the  request of [facility] for evaluation of  arrhythmia.   HISTORY OF PRESENT ILLNESS:  The patient is a 65 year old white male  with a history of a nonischemic cardiomyopathy presumed to secondary to  alcoholism, but with last EF of 40-45% with no segmental wall motion  abnormalities what I am unable to gather from the chart, who comes in  with complaint of generalized weakness over the past 3-4 months, reports  this weakness has been manifested by inability to want to get up and do  anything, depression symptoms, alteration in sleep-awake cycles, and  just overall malaise.  He took his blood pressure today and found that  his pulse rate was said to be 42 on his monitor and blood pressure was  90s/40s and decided to come in.  He had no symptoms at that time.  He  does report he has had occasional orthostatic dizziness mostly when he  stands up for going to the bathroom, but is related to a lower blood  pressure that he has had.  Specifically denies any chest pain or  pressure.  Has not had any PND or orthopnea.  No lower extremity edema,  although his abdomen has been more distended as of late.   PAST MEDICAL HISTORY:  1. Ischemic cardiomyopathy, EF of 40-50%.  2. History of coronary artery disease with no significant obstructive      left disease and chronically occluded right coronary with no      significant ischemia found on nuclear stress test in 2007.  3. Hyperlipidemia.  4. Alcohol abuse.  5. Depression.   SOCIAL HISTORY:  Lives here in  [place] area.  No tobacco.  Positive  for alcohol use.  He states his last drink was today, but only had a  half a beer and has cut down significantly over the past month and a  half.   FAMILY HISTORY:  Reviewed and is noncontributory with the patient's  current medical conditions.   MEDICATIONS:  1. Lasix 40 mg a day.  2. Coreg 3.125 mg b.i.d.  3. Diazepam 10 mg p.r.n.  4. Crestor 20 mg a day.  5. Effexor 150 mg a day.  6. Lisinopril 2.5 mg a day.  7. Omeprazole 20 mg a day.   ALLERGIES:  No known drug allergies.   REVIEW OF SYSTEMS:  Negative 11-point review of systems except for  otherwise dictated in the above HPI.   PHYSICAL EXAMINATION:  VITAL SIGNS:  Blood pressure is 106/73, heart  rate of 80-94, afebrile.  GENERAL:  Well-developed, well-nourished white male in no acute  distress.  HEENT:  Moist mucous membranes.  No scleral icterus.  No conjunctival  pallor.  NECK:  Supple.  Full range of motion.  Jugular venous pressure estimated  approximately 6-8 cm of water.  CARDIOVASCULAR:  Regular rate and rhythm.  No rubs, murmurs, or gallops.  CHEST:  Clear to auscultation bilaterally.  There are no rales.  ABDOMEN:  Protuberant and distended with mild fluid wave.  Decreased  bowel sounds bilaterally.  EXTREMITIES:  No significant peripheral edema.  Some mild chronic  venostasis changes.  NEUROLOGIC:  Alert and oriented x2.   Chest x-ray demonstrates no acute infiltrative process.  Chest x-ray  normal sinus rhythm.  Normal axis.  Nonspecific ST and T wave  abnormality.  His labs significant for a white count of 3.6, hemoglobin  of 12.1, BUN and creatinine of 12 and 1.0.  Cardiac biomarkers are  within normal limits.  On review of telemetry, he has had runs of  couplets trigeminy and bigeminy, all of which has been asymptomatic.   IMPRESSION:  1. Asymptomatic premature ventricular contractions with 1 run of      nonsustained ventricular tachycardia for which he  received      lidocaine in the field.  2. Nonischemic cardiomyopathy with ejection of 40-45%.  3. 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   His increased frequency of PVCs I think is likely multifactorial with  his underlying liver related metabolic process and heart failure.  I  think it is reasonable to get a repeat 2D echocardiogram to see what his  LV systolic function is.  I think it is unlikely to be worsening  ischemia.  He has had chest discomfort in the past associated with  ischemia and he has no chest pain now.   I think if we can increase his beta-blocker may help the ectopy, but if  he does not have any significant symptomatic runs of VT, at this point  ICD would not be indicated.   We would possibly consider a noninvasive risk assessment for  asymptomatic coronary lesions that may be contributing this should his  EF remain unchanged.   We will follow this patient with you.      Vernice Jefferson, MD  Electronically Signed     JT/MEDQ  D:  12/24/2008  T:  12/24/2008  Job:  [id number]

## 2011-01-13 NOTE — Discharge Summary (Signed)
NAMEFRAN, [name]               ACCOUNT NO.:  [id number]   MEDICAL RECORD NO.:  [id number]           PATIENT TYPE:   LOCATION:                                 FACILITY:   PHYSICIAN:  Beckey Rutter, MD  DATE OF BIRTH:  Mar 08, 1946   DATE OF ADMISSION:  DATE OF DISCHARGE:                               DISCHARGE SUMMARY   PRIMARY CARE PHYSICIAN:  Dr. Samuel Jester.   PRIMARY CARDIOLOGIST:  Jonelle Sidle, MD, with [facility].   CHIEF COMPLAINT:  Weakness.   HISTORY OF PRESENT ILLNESS AND HOSPITAL COURSE:  [name] is a 65-year-  old pleasant Caucasian male with multiple medical problems, presented  for weakness.  The patient was found to have hypotension and bradycardia  with a heart rate around 40.  The patient was started on lidocaine drip  en route to the hospital.  During the hospital stay, the patient was  seen by Cardiology Service and EP.  The patient was felt to have  cardiomyopathy with low ejection fraction.  The patient was offered ICD  to regulate his heart rate but he wanted to quit alcohol and wait for 3  months and reevaluate the need for ICD and this is the plan of discharge  as per discussion with cardiologist, Dr. Clifton James.   The patient received IV diuresis and his medication was adjusted with  increase of Coreg to 6.25.  The patient should follow up with Dr.  Diona Browner as discussed with him and the number is provided in the  discharge summary.   PROCEDURES:  1. Chest x-ray on December 24, 2008.  Impression is mild right basilar      atelectasis.  Chest hyperextension compatible with emphysema.  2. December 25, 2008, MR cardiac muscle.  Impression is moderately      decreased left ventricular systolic function with ejection fraction      estimated 34.7%.  3. The basal to mid inferior lateral wall is thin and akinetic with      increased more than 75% thickness, subendocardial delayed      enhancement.  These segments would be unlikely to improve in  function with revascularization.  The MR cardiac muscle is read by      Dr. Marca Ancona.   DISCHARGE DIAGNOSES:  1. Premature ventricular contractions with nonsustained ventricular      tachycardia.  2. Unknown cardiomyopathy.  3. Mixed congestive heart failure.  4. Ethanol dependency and abuse.  5. Hypertension.  6. Hyperlipidemia.  7. Chronic obstructive pulmonary disease.  8. Osteoarthritis.  9. Degenerative disk and degenerative joint disease.  10.Coronary artery disease.  11.History of gastroesophageal reflux disease.  12.History of Bell palsy.   DISCHARGE MEDICATIONS:  1. Aspirin 81 mg daily.  2. Coreg 6.25 mg p.o. b.i.d.  3. Lasix 60 mg p.o. daily.  4. Lisinopril 2.5 mg p.o. daily.  5. Crestor 10 mg p.o. daily.  6. Senokot-S 1 tablet p.o. h.s.  7. Vitamin B 100 mg p.o. daily.  8. Effexor 150 mg p.o. daily.  9. Ativan 1 mg p.o. b.i.d. p.r.n.  10.Ambien 5-10 mg p.o.  h.s. p.r.n.  11.OxyContin 80 mg every 8 hours as needed p.r.n.  12.Oxycodone 10/325 every 6 hours as needed.  13.Omeprazole 20 mg p.o. daily.  14.k-Dur every other day.   A 2-D echo on December 24, 2008, the study is conclusive to:  1. Left ventricular very difficult autistic window.  LVEF appears      moderately depressed with diffuse hypokinesis.  The cavity size was      normal.  Wall thickness was increased in pattern of mild LVH.  2. Mitral valve with mild regurgitation.  Please refer to the cardiac      MRI discussed above.   DISCHARGE PLAN:  The patient is discharged to follow up with Dr.  Diona Browner as discussed with him.  He wanted to quit ethanol after  discharge.  He is aware and agreeable to discharge plan.      Beckey Rutter, MD  Electronically Signed     EME/MEDQ  D:  12/27/2008  T:  12/28/2008  Job:  [id number]

## 2011-01-13 NOTE — Assessment & Plan Note (Signed)
[facility]                          [facility] OFFICE NOTE   [name], [name]                      MRN:          [id number]  DATE:05/29/2008                            DOB:          Jul 18, 1946    CARDIOLOGIST:  Jonelle Sidle, MD.   PRIMARY CARE PHYSICIAN:  Samuel Jester, MD.   REASON FOR VISIT:  A 1-year followup.   HISTORY OF PRESENT ILLNESS:  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  Overall since last being  seen, he is doing well.  He has been established with Dr. Orson Aloe.  The patient notes that he is now taking inhalers, which have helped his  shortness of breath somewhat.  Also, his CPAP therapy has been adjusted.  He has had stable angina pectoris over the years.  He actually tells me  that his chest pain has been fairly infrequent.  There has been no  significant change in his symptoms.  He denies any significant  exertional chest discomfort.  He does have exertional shortness of  breath and he describes NYHA class IIB symptoms.  He denies orthopnea,  PND, or pedal edema.  He denies syncope and near-syncope.  He does note  significant amounts of fatigue.  He also notes problems with  hypotension.  He discontinued his carvedilol several months ago  secondary to blood pressure readings in the 90s systolically.  He did  feel somewhat near-syncopal with this as well as fatigued.  He denies  any bleeding.  He denies any melena, hematochezia, and hematuria.  Of  note, he sees Dr. Charm Barges later this week for routine followup.  Of note,  his lipid panel drawn in July 2009 revealed   DICTATION ENDS HERE.      Tereso Newcomer, PA-C       Jonelle Sidle, MD    SW/MedQ  DD: 05/29/2008  DT: 05/30/2008  Job #: [id number]   cc:   Samuel Jester  Dr. Orson Aloe

## 2011-01-13 NOTE — H&P (Signed)
[name], [name] NO.:  [id number]   MEDICAL RECORD NO.:  [id number]          PATIENT TYPE:  INP   LOCATION:  1857                         FACILITY:  [facility]   PHYSICIAN:  Vania Rea, M.D. DATE OF BIRTH:  1945-12-31   DATE OF ADMISSION:  01/20/2009  DATE OF DISCHARGE:                              HISTORY & PHYSICAL   PRIMARY CARE PHYSICIAN:  Dr. Samuel Jester.   CARDIOLOGIST:  Jonelle Sidle, M.D. with [facility].   DICTATING PHYSICIAN:  Vania Rea, M.D.   CHIEF COMPLAINT:  Chest pain.   HISTORY OF PRESENT ILLNESS:  This is a pleasant 65 year old Caucasian  gentleman with multiple medical problems who was investigated at this  facility for weakness and palpitations.  Was found to have  cardiomyopathy of unknown etiology, mixed congestive heart failure, COPD  and was felt to be a candidate for an AICD.  The patient indicated that  he would discontinue alcohol and reassess his need for an AICD at a  later date.  The patient was last evaluated by his cardiologist 3 days  ago and at that time was stable.  However, his Coreg was increased to  6.25 mg twice daily.  The patient now comes in complaining of episodic  chest pain since yesterday.  These chest pains are sharp, pressing and  associated with dizziness, diaphoresis and nausea, tingling in his hands  and numbness and burning of his feet.  Patient at baseline does have  dyspnea on exertion.   The patient says that whenever he gets these strange feelings of  weakness and dizziness, he checks his pulse and it is sometimes in the  30s.  Eventually came to the emergency room to be evaluated and the  hospitalist service was called to assist with management.  The patient  was apparently discussed with the cardiology service and they requested  that the hospitalist admit the patient on observation.   As well as having his carvedilol dose increased, patient also visited  his PCP a few  days ago and was started on Aricept.   PAST MEDICAL HISTORY:  1. 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  2. Two-vessel coronary artery disease with occluded RCA in June 2005.  3. Chronic systolic heart failure.  4. Frequent ventricular ectopy.  5. History of alcohol abuse, discontinued for 1 month.  6. COPD.  7. Dyslipidemia.  8. Obstructive sleep apnea on CPAP at bedtime.  9. Osteoarthritis.  10.Degenerative disk disease.  11.History of Bell's palsy.  12.History of GERD.   PAST SURGICAL HISTORY:  1. Multiple lumbar surgeries with fusion of L3-L4 and microdissection      of L5-S3.  2. History of umbilical hernia repair.   MEDICATIONS:  1. Carvedilol 6.25 mg b.i.d.  2. Aspirin 325 mg daily.  3. Crestor 10 mg daily.  4. Potassium chloride 10 mEq daily.  5. Percocet 10/325 four times daily.  6. Lisinopril 2.5 mg daily.  7. OxyContin 80 mg over 8 hours.  8. Omeprazole 10 mg daily.  9. Effexor 150 mg daily.  10.Furosemide 60 mg daily.  11.Ambien 5 mg at bedtime.  12.Diazepam 10 mg every 6 hours p.r.n.  13.Vitamin D 50,000 units weekly.  14.Folic acid 1 mg daily.  15.Thiamine 100 mg daily.   ALLERGIES:  PENICILLIN causes a rash.   SOCIAL HISTORY:  Discontinued smoking some time ago.  He used to drink a  significant amount, but states that he stopped drinking since his  discharge in April.   FAMILY HISTORY:  Positive for alcoholism and coronary artery disease.   REVIEW OF SYSTEMS:  Other than noted above, a 10-point review of systems  is significant only for severe dyspnea on exertion at baseline and had  his lower extremity evaluated for vascular problems, reportedly about 5  years ago, and showed only mild impairment.   PHYSICAL EXAM:  Pleasant, middle-aged, Caucasian gentleman reclining in  the stretcher in no acute distress at this time.  VITALS:  Temperature 98.2.  His pulse ranges between 59 and 78.  He is  saturating at 98% on  2 liters.  His respiratory rate is 20.  His blood  pressure is 106/81.  Pupils are round and equal.  Mucous membranes pink.  Anicteric.  No  slack lymphadenopathy or thyromegaly.  CHEST:  Clear to auscultation bilaterally.  CARDIOVASCULAR:  Regular rhythm.  No murmur heard.  ABDOMEN:  Soft and nontender.  There are no masses.  EXTREMITIES:  Without edema.  Dorsalis pedis pulses are not felt.  The  toes are dusky and cold.  Capillary refill is slow, but he complains of  no pain or tenderness.  CENTRAL NERVOUS SYSTEM: Cranial nerves II-XII are grossly intact, but he  has no focal neurologic deficit.   LABS:  White count is 4.6, hemoglobin 13.4, platelets 203.  He shares a  normal differential on his white count.  Sodium is 139, potassium 3.9,  chloride 98, BUN 15, creatinine 1.4, glucose 95.  Chest x-ray shows COP  and chronic bronchitis with left basilar atelectasis.  EKG shows sinus  rhythm with variable rate between 50 and 99 with multiple premature  ventricular contractions and nonspecific ST abnormalities.   ASSESSMENT:  1. Chest pain and dizziness associated with bradycardia in a gentleman      who has recently had his dose of carvedilol increased, and was      started on Aricept, also a rate modifying drug.  2. Nonspecific cardiomyopathy.  3. Systolic congestive heart failure.  4. Possible peripheral vascular disease.  5. Chronic pain syndrome.  6. Other medical problems as noted above,   PLAN:  1. We will bring this gentleman in for serial cardiac enzymes and will      reduce the dose of his carvedilol back to 3.125 and discontinue the      Aricept.  2. If the patient is stable overnight, he can probably discharge back      to follow up his primary cardiologist.  3. Other plans as per orders.   Patient made himself DNR today.      Vania Rea, M.D.  Electronically Signed     LC/MEDQ  D:  01/20/2009  T:  01/20/2009  Job:  [id number]   cc:   Jonelle Sidle,  MD  Samuel Jester

## 2011-01-13 NOTE — Assessment & Plan Note (Signed)
[facility]                          [facility] OFFICE NOTE   [name], [name]                      MRN:          [id number]  DATE:01/17/2009                            DOB:          September 18, 1945    PRIMARY CARDIOLOGIST:  Jonelle Sidle, MD   REASON FOR VISIT:  Post hospital followup.   Mr. [name] presents to our clinic following recent hospitalization at  [facility] for evaluation of weakness.  He was admitted to the  hospitalist service, but referred to our team initially for evaluation  of arrhythmia.  He has known mixed cardiomyopathy, with previous  ejection fraction of 40-45% by echocardiography in 2008.   There was no definite evidence of ischemia.  Initial evaluation was for  nonsustained ventricular tachycardia, which was asymptomatic, but for  which he was treated with lidocaine in the field.  His workup included a  cardiac MRI indicating an EF of 34%, with greater than 75% thickness  scar at the basal/mid inferolateral left ventricle with associated  akinesis.  There was also global hypokinesis.   A 2-D echocardiogram was a limited study, suggestive of mild LVH and  mild MR.   The patient was also seen in consultation by Dr. Johney Frame, for  consideration of ICD implantation.  He noted frequent PVCs with  occasional couplets, as well as NSVT.  He noted that the patient would  fit the criteria with NYHA class III chronic heart failure, and also  noted that up titration of his medication was limited by hypotension.  Citing his multiple comorbidities, Dr. Johney Frame indicated that alcohol  cessation would be helpful, with possible improvement of his ejection  fraction.  The patient himself wanted to consider this option, and now  presents in followup.   He informs me today that he has quit drinking alcohol.  He continues to  remain weak, however, and reports blood pressure readings of 90-125  systolic at home.  He denies any falls  or syncope, however.  He does  indicate that he feels too weak to drive.   Of note, medications at time of discharge indicate increasing Coreg to  6.25 b.i.d.  The patient currently, however, remains on the previous  starting dose of 3.125 b.i.d.   EKG today indicates NSR with ventricular bigeminy at 75 bpm.  There is  chronic inferolateral downsloping ST-segment depression.   CURRENT MEDICATIONS:  1. Carvedilol 3.125 b.i.d.  2. Aspirin 325 daily.  3. Crestor 10 daily.  4. KCl 10 mEq.  5. Percocet 10/325 q.i.d.  6. Lisinopril 2.5 daily.  7. OxyContin 80 q.8 h.  8. Omeprazole.  9. Effexor XR 150 daily.  10.Furosemide 60 daily.   PHYSICAL EXAMINATION:  VITAL SIGNS:  Blood pressure 110/72, pulse 70 and  regular, and weight 214.  GENERAL:  A 65 year old male, sitting upright, no distress.  HEENT:  Normocephalic and atraumatic.  NECK:  Palpable carotid pulse without bruits; no JVD at 90 degrees.  LUNGS:  Clear to auscultation in all fields.  HEART:  Regularly irregular.  Diminished heart sounds.  No significant  murmurs.  No  rubs.  ABDOMEN:  Protuberant, intact bowel sounds.  EXTREMITIES:  Trace pedal edema.  NEUROLOGIC:  Flat affect, but no focal deficit.   IMPRESSION:  1. Mixed cardiomyopathy.      a.     Ejection fraction 34.7% by cardiac MRI.      b.     The patient declining ICD implantation, at this point.      c.     Two-vessel CAD with occluded RCA and distal       collateralization, June 2005.      d.     Negative adenosine Cardiolite, April 2007.  2. Chronic systolic heart failure.      a.     NYHA class II-III.  3. Transient hypotension.  4. Frequent ventricular ectopy.  5. Alcohol abuse.  6. COPD.  7. Dyslipidemia.  8. Morbid obesity.  9. Obstructive sleep apnea, on CPAP.   PLAN:  1. Increase Coreg to the recommended dose of 6.25 b.i.d.  Follow up      blood pressure/pulse check in 2 weeks.  2. Followup 2-D echocardiogram in 3 months, for reassessment of  LV      function.  If this continues to demonstrate an EF of 35% or less,      then we will need to reconsider proceeding with ICD implantation.  3. The patient is strongly advised to continue to refrain from alcohol      consumption.  4. Followup BMET and BNP level today.  5. The patient is strongly advised to refrain from driving, given his      persistent weakness.  6. Schedule return clinic followup with myself and Dr. Diona Browner in 3      months, for review of echocardiogram results and further      recommendations.      Rozell Searing, PA-C  Electronically Signed      Jonelle Sidle, MD  Electronically Signed   GS/MedQ  DD: 01/17/2009  DT: 01/18/2009  Job #: [id number]   cc:   Samuel Jester

## 2011-01-13 NOTE — Assessment & Plan Note (Signed)
[facility]                          [facility]ICE NOTE   Stirling, Orton [name]                      MRN:          [id number]  DATE:03/30/2007                            DOB:          [birth year]    CARDIOLOGIST:  Jonelle Sidle, MD   PRIMARY CARE PHYSICIAN:  Dr. Samuel Jester.   REASON FOR VISIT:  Annual follow up.   HISTORY OF PRESENT ILLNESS:  [name] is a 65 year old male patient  followed by Dr. Diona Browner with a history of coronary artery disease; and  ischemic cardiomyopathy with a previous EF of 35 to 40%, improved to 45  to 50% by echocardiogram done in April 2007.  He has a history of  chronically occluded RCA with left-to-right and right-to-right  collaterals.  His last catheterization in June 2005 revealing a 60-70%  stenosis in the ramus intermedius; and minimal amount of obstructive  disease elsewhere.  His last nuclear scan was done in April 2007.  The  images were inadequate at that time.   The patient returns today for follow up.  He is having continued chest  pain.  He notes that he has had this ever since his myocardial  infarction in the 19s.  This is only when he gets angry.  It is a  substernal tightness.  It usually resolves in about 30 minutes.  He does  not take nitroglycerin.  He continues to note, exertional dyspnea.  He  rarely describes NYHA Class II symptoms.  He denies any orthopnea or  PND.  He notes that he has some mild pedal edema that is chronic without  changes. He denies any syncope.  He does note some exertional chest  discomfort from time to time.  Again, this is fairly chronic without  change over the last several years.  He denies any wheezing.  He does  report a cough.  This is fairly nonproductive.  He denies any  hemoptysis.   He tells me that he did come off of his nitrates a few months ago,  because he wanted to take Viagra.  Of note, he denied any worsening  chest discomfort or  change in his symptoms while he was off of his  isosorbide.  He recently did go back on that.   The patient also notes that he stopped his Altace several months ago  secondary to palpitations.   CURRENT MEDICATIONS:  1. Fenofibrate 160 mg daily.  2. Simvastatin 8 mg daily.  3. Klor-Con 10 mEq  daily.  4. Multivitamin daily.  5. Effexor ER 150 mg daily.  6. Oxycodone 80 mg b.i.d.  7. Isosorbide 60 mg one-half tablet twice daily.  8. Coreg 12.5 mg one-half p.o. b.i.d.  9. Furosemide 20 mg three tablets daily.  10.Nexium 40 mg daily.  11.Folic acid.  12.Aspirin 325 mg daily.  13.Diazepam 10 mg four times a day.  14.Percocet p.r.n.  15.CPAP nightly.   ALLERGIES:  PENICILLIN.   PHYSICAL EXAMINATION:  A well-nourished, well-developed male.  Blood pressure 124/81, pulse 84, weight is 211 pounds.  Oxygen  saturation 98% on room air.  HEENT:  Normal.  NECK:  Without JVD, without lymphadenopathy.  CARDIAC: Normal S1 and S2.  Regular rate and rhythm with a grade 1/6  systolic ejection murmur, heard best at the apex.  LUNGS:  Clear to auscultation bilaterally without wheezing, rhonchi, or  rales.  ABDOMEN:  Soft and nontender.  EXTREMITIES:  With trace edema bilaterally, calves soft and nontender.  SKIN: Warm and dry.  NEUROLOGIC:  He is alert and oriented x3.  Cranial nerves II-XII are  grossly intact.  Carotids are without bruits bilaterally.   Electrocardiogram reveals a sinus rhythm with a heart rate of 83, normal  axis, no significant ST-T wave changes when compared to prior tracings.  PVCs noted.   IMPRESSION:  1. Chronic stable angina pectoris.  2. Dyspnea.  3. Ischemic cardiomyopathy with previous ejection fraction of 35%.      a.     Improved to 45-50% by echocardiogram April 2007.  4. Coronary artery disease.      a.     Status post remote myocardial infarction.      b.     Totally occluded RCA by catheterization in June of 2005, as       well as proximal 60-70%  lesion in the ramus, 20-30% proximal       stenosis in the LAD.      c.     Follow up nuclear scan in April 2007, uninterpretable.  5. Treated dyslipidemia.  6. Hypertension.  7. Ex-smoker.  8. Sleep apnea on CPAP.  9. Depression.  10.History of Bell's palsy.  11.History of sinus surgery.  12.History of back surgery.  13.Erectile dysfunction.   PLAN:  The patient presents to the office today for followup.  Overall,  he is doing well.  His symptoms, overall, seem to be fairly stable.  He  does still complain of a lot of shortness of breath.  He has a 40-pack  year history of smoking.  He has not smoked in several years.  I think  that it is worthwhile to do a PFT with DLCO as well as a chest x-ray.  We will also do some blood work today with a BMET and a BNP level and  magnesium level.  I checked the magnesium as well secondary to PVCs  noted on his EKG.  I have also asked the patient to get back on an ACE  inhibitor given his history of cardiomyopathy and coronary artery  disease.  I have suggested lisinopril 5 mg a day.  If he cannot tolerate  this, then we could certainly try him on an ARB.  We will get a BMET in  a weeks' time for followup on his renal function and potassium.  The  patient will follow up with Dr. Diona Browner in 4 weeks' time.  Will follow  up on the above testing at that time and make further recommendations.  I considered proceeding with echocardiogram and stress testing today,  but decided to await results of the above testing before making that  decision.      Tereso Newcomer, PA-C  Electronically Signed      Learta Codding, MD,FACC  Electronically Signed   SW/MedQ  DD: 03/30/2007  DT: 03/31/2007  Job #: [id number]   cc:   Samuel Jester

## 2011-01-13 NOTE — Assessment & Plan Note (Signed)
[facility]                          [facility]   [place], [place] [name]                      MRN:          [id number]  DATE:05/29/2008                            DOB:          [birth year]    CARDIOLOGIST:  Jonelle Sidle, MD   PRIMARY CARE PHYSICIAN:  Samuel Jester, MD   REASON FOR VISIT:  A 1-year followup.   HISTORY OF PRESENT ILLNESS:  Mr. [name] is a 62-year male patient with a  history ischemic cardiomyopathy with an EF of 40-45% who presents for  annual followup.  The patient has done well over the last year.  He did  see Dr. Orson Aloe in establishment for COPD.  He was placed on p.r.n.  inhalers and this has apparently helped the patient's shortness of  breath quite significantly.  His CPAP therapy was also titrated.  The  patient notes that he has stable chest pain.  He has had stable angina  pectoris in the past.  He notes that his chest pain is fairly  infrequent.  He denies any significant exertional chest discomfort.  In  regards to his exertional shortness of breath, he describes NYHA class  IIB symptoms.  He denies orthopnea, PND, or pedal edema.  He denies  syncope.  He denies palpitations.  He has had some problems with  fatigue.  He also has had some problems with hypotension.  The patient  apparently discontinued his carvedilol some months ago secondary to low  blood pressures.  He tells me that his pressures are running in the 90s  systolically.  He had lab work performed by Dr. Charm Barges in July 2009 that  revealed an LDL of 177, HDL of 52, and a triglyceride level of 283.  He  had apparently previously been on Crestor.  This had been changed to  simvastatin, but the patient was not taking it on a regular basis at  that time his blood was drawn.  He says that he is now taking it on a  daily basis and is apparently due for followup blood work in the next  week with his primary care physician.  He denies any  history of  bleeding.  He denies any melena, hematochezia, or hematuria.   CURRENT MEDICATIONS:  Fenofibrate 160 mg daily, Simvastatin 80 mg daily,  Potassium 10 mEq daily p.r.n., Multivitamin daily, Vitamin E, Garlic  gel, Flaxseed, Lisinopril 5 mg daily,  Effexor 150 mg daily, OxyContin 80 mg b.i.d. 3 times a day, Furosemide  60 mg daily, Nexium 40 mg daily, Folic acid, Aspirin 325 mg daily,  Diazepam 10 mg nightly,  Percocet p.r.n.   PHYSICAL EXAMINATION:  GENERAL:  He is a well-nourished and well-  developed male, in no distress.  VITAL SIGNS:  Blood pressure is 112/74, pulse 81, and weight 209.4  pounds.  HEENT:  Normal.  NECK:  Without JVD.  LYMPH:  Without lymphadenopathy.  CARDIAC:  Normal S1 and S2.  Regular rate and rhythm without murmur.  LUNGS:  Clear to auscultation bilaterally.  ABDOMEN:  Soft and nontender.  EXTREMITIES:  Without edema.  Calves soft and nontender.  SKIN:  Warm and dry.  NEUROLOGIC:  He is alert and oriented x3.  Cranial nerves II-XII are  grossly intact.  VASCULAR:  No carotid artery bruits bilaterally.   Electrocardiogram reveals sinus rhythm, heart rate is 87, normal axis,  and occasional PVCs.  No significant change since previous tracing.   ASSESSMENT AND PLAN:  1. Ischemic cardiomyopathy with an ejection fraction of 40-45% in a 37-      year male patient with a history of coronary artery disease, status      post remote myocardial infarction and known totally occluded right      coronary artery with left-to-right and right-to-right collaterals      and residual, 60-70% stenosis in the ramus intermedius and 20-30%      proximal stenosis in the left anterior descending treated      medically.  The patient is doing well from a chest pain standpoint.      He has had stable angina pectoris in the past.  He takes      nitroglycerin infrequently.  His chest pain symptoms overall have      been stable without significant change since he was last  seen 1      year ago.  His last nuclear scan was in April 2007 and his last      echocardiogram was last October.  In terms of his medical therapy,      he has had to discontinue some of his medications secondary to      hypotension.  I have recommended adjusting his medications to      hopefully allow treatment with a beta-blocker and ACE inhibitor.      The patient will have his lisinopril reduced to 1.25 mg daily.  We      will try to reinitiate carvedilol at 3.125 mg b.i.d.  He will be      brought back in several months for followup to assess his response      to treatment.  Of note, the patient sees Dr. Charm Barges this week.  I      have asked that he have a BMET drawn at that appointment.  The      patient is on a fairly large dose of Lasix.  His functional class      is stable and he may not require as much Lasix as he is getting.      We will see what his BMET shows and if he is prerenal, we will      reduce his dosage of furosemide.  2. Dyslipidemia.  His last lipid panel was suboptimal, but he is now      back on daily simvastatin therapy.  As noted above, he sees Dr.      Charm Barges later this week and will apparently have repeat lipids      performed at that time.  As noted previously, his goal LDL is less      than or equal to 70.  3. Chronic obstructive pulmonary disease.  He is followed by Dr.      Orson Aloe.  He seems to respond well to p.r.n. bronchodilator      therapy.  4. Fatigue.  This seems to be a chronic problem.  The patient is on      several narcotic pain medications.  This may be contributing      somewhat to his fatigue.  Sleep apnea may also be playing a role.  I considered checking thyroid studies and a CBC today, but I      elected to defer that to Dr. Charm Barges since he does see her later      this week.  I have encouraged him to discuss his fatigue with Dr.      Charm Barges for further workup at that time.   DISPOSITION:  The patient will be brought back in  followup in the next 3  months to see Dr. Diona Browner or sooner p.r.n.      Tereso Newcomer, PA-C  Electronically Signed      Jonelle Sidle, MD  Electronically Signed   SW/MedQ  DD: 05/29/2008  DT: 05/30/2008  Job #: [id number]   cc:   Samuel Jester  Dr. Orson Aloe

## 2011-01-13 NOTE — Discharge Summary (Signed)
NAMEBRAELYNN, LUPTON               ACCOUNT NO.:  [id number]   MEDICAL RECORD NO.:  [id number]          PATIENT TYPE:  INP   LOCATION:  5502                         FACILITY:  [facility]   PHYSICIAN:  Elliot Cousin, M.D.    DATE OF BIRTH:  11/06/1945   DATE OF ADMISSION:  01/20/2009  DATE OF DISCHARGE:  01/22/2009                               DISCHARGE SUMMARY   DISCHARGE DIAGNOSES:  1. Chest pain associated with dizziness, bradycardia, and hypotension.  2. 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  3. Chronic premature ventricular contractions; a consideration has      been made for implantable cardioverter-defibrillator placement.  4. Confusion, possibly secondary to cerebral hypoperfusion from      relative hypotension and possible contribution from opiates.   DISCHARGE MEDICATIONS:  1. Decrease Coreg back to 3.125 mg b.i.d.  2. Decrease Percocet to 10 mg/325 mg 1 every 12 hours as needed (do      not take if you feel dizzy or lightheaded).  3. Lasix 60 mg daily as needed.  4. Crestor 10 mg nightly.  5. Effexor XR 150 mg daily.  6. Lisinopril 2.5 mg daily.  7. Potassium chloride 10 mEq daily.  8. Omeprazole 20 mg daily.  9. OxyContin 80 mg every 8 hours.  10.Valium 10 mg every 6 hours as needed.  11.Thiamine 100 mg daily.  12.Folic acid 1 mg daily.  13.Aricept 10 mg nightly.  14.Ambien 5 mg nightly.   DISCHARGE DISPOSITION:  The patient is being discharged home today in  improved and stable condition.  He was advised to follow up with his  primary cardiologist, Dr. Diona Browner, and per his request, Dr. Sherryl Manges at [facility].  The patient was given the number to call  for a followup appointment.   CONSULTATIONS:  [facility] cardiologists including Dr. Aldona Bar and  Dr. Arvilla Meres.   PROCEDURE PERFORMED:  Chest x-ray on Jan 20, 2009:  The results revealed  chronic obstructive pulmonary disease/chronic bronchitis with left  basilar  atelectasis.   HISTORY OF PRESENT ILLNESS:  The patient is a 65 year old man with a  past medical history significant for a mixed cardiomyopathy with an  ejection fraction of 34%, coronary artery disease, chronic systolic  heart failure and recent discontinuation of alcohol use.  He presented  to the emergency department on Jan 20, 2009, with a chief complaint of  chest discomfort associated with generalized weakness, confusion, and  palpitations.  When he was evaluated in the emergency department, he was  noted to be hemodynamically stable; however, his blood pressure was on  the lower end of normal, ranging from the lower 90s to the low 100s  systolically.  He was also episodically bradycardic with a pulse rate  ranging from 59 to 78 beats per minute.  His chest x-ray revealed  changes of COPD.  His EKG revealed sinus rhythm with bigeminy and a  heart rate of 89 beats per minute.  He was admitted for further  evaluation and management.   For additional details, please see the dictated history and physical.  HOSPITAL COURSE:  Because of the patient's relative bradycardia and  relative hypotension, the dose of Coreg was decreased to 3.125 mg daily.  Apparently, the patient had seen his primary cardiologist prior to the  hospitalization and the dose of Coreg was increased to 6.25 mg b.i.d.  Upon the initial evaluation, the patient appeared to be alert and  oriented.  Subsequently, cardiologist, Dr. Aldona Bar, was consulted.  He evaluated the patient the next day.  Dr. Mayford Knife noted that the  patient's ejection fraction was 34%.  He also noted that the dose of  Coreg was recently increased to help manage the patient's frequent PVCs.  The patient apparently had been seen and evaluated for consideration of  an ICD in the recent past; however, he initially declined placement.  Dr. Mayford Knife recommended that the Coreg be increased back to 6.25 mg b.i.d  as the admitting physician did  decrease the dose to 3.125 mg b.i.d.  Therefore, the dose of the Coreg was increased again to 6.25 mg b.i.d.  in an attempt to treat the frequent PVCs.  The patient's blood pressure  and heart rate were followed closely.  He was maintained on all of his  other cardiac medications.  He was maintained on OxyContin 80 mg q.8 h.;  however, the Percocet was initially withheld because of the reported  confusion.  Following the increase in the dose of Coreg, the patient  became mildly symptomatic again.  Dr. Gala Romney provided the followup  evaluation and recommended that the Coreg be decreased back to 3.125 mg  b.i.d.  The patient reconsidered ICD placement and said that if he was a  candidate, he was ready for it.  He discussed ICD placement with Dr.  Gala Romney today; however, Dr. Gala Romney informed him that he was stable  and could be discharged.  The patient apparently became upset and stated  that he wanted to follow up with another cardiologist in another  practice for a second opinion.  When the patient discussed this with his  wife, he recanted and decided to follow up with [facility]  instead.  He wants to follow up with Dr. Graciela Husbands.  He was given Dr.  Odessa Fleming office number to call for an appointment.   For additional evaluation, cardiac enzymes were ordered as well as a  magnesium level and blood ammonia level.  His cardiac enzymes were  within normal limits.  His blood magnesium was within normal limits at  2.4 and his ammonia level was within normal limits at 26.  His  electrolytes and CBC remained within normal limits during  hospitalization.  Today, his blood pressure is 104/70 and his heart rate  has been ranging between 59 and 64.  He has been ambulating in his room  and in the hallway and there is no evidence of confusion, weakness, or  chest pain.  The patient believes that the confusion and weakness were  secondary to his low blood pressure and not necessarily the opiate  use.  I did advise that he decrease the dosing of Percocet and to not to take  it if he felt dizzy or lightheaded.  He voiced understanding.      Elliot Cousin, M.D.  Electronically Signed     DF/MEDQ  D:  01/22/2009  T:  01/23/2009  Job:  [id number]   cc:   Duke Salvia, MD, [facility]  Jonelle Sidle, MD

## 2011-01-13 NOTE — H&P (Signed)
NAME:  SHERMAR, FRIEDLAND NO.:  [id number]   MEDICAL RECORD NO.:  [id number]          PATIENT TYPE:  INP   LOCATION:  1857                         FACILITY:  [facility]   PHYSICIAN:  Vania Rea, M.D. DATE OF BIRTH:  [birth year]   DATE OF ADMISSION:  01/20/2009  DATE OF DISCHARGE:                              HISTORY & PHYSICAL      Vania Rea, M.D.  Electronically Signed     LC/MEDQ  D:  01/20/2009  T:  01/21/2009  Job:  [id number]   cc:   Jonelle Sidle, MD  Samuel Jester

## 2011-01-13 NOTE — Consult Note (Signed)
NAMESAHMIR, WEATHERBEE NO.:  [id number]   MEDICAL RECORD NO.:  [id number]          PATIENT TYPE:  INP   LOCATION:  2022                         FACILITY:  [facility]   PHYSICIAN:  Hillis Range, MD       DATE OF BIRTH:  1946/08/20   DATE OF CONSULTATION:  DATE OF DISCHARGE:                                 CONSULTATION   REQUESTING PHYSICIAN:  Marca Ancona, MD   REASON FOR CONSULTATION:  Risk stratification for sudden death.   HISTORY OF PRESENT ILLNESS:  Mr. Hosking is a 65 year old gentleman with  a history of a mixed cardiomyopathy, ejection fraction 34%, chronic  alcoholism, New York Heart Association class III heart failure, and  coronary artery disease who was admitted with hypotension and fatigue.  The patient has a longstanding cardiomyopathy with prior ejection  fractions between 40 and 45%.  He had a myocardial infarction.  He has a  history of coronary artery disease with a chronically occluded right  coronary artery and no ischemia found on a stress test in 2007.  He  reports progressive symptoms of decreased exercise capacity and fatigue.  He also reports shortness of breath with moderate activities.  He  recently had worsening fatigue and malaise and was found blood pressures  in the 90s over 40s with heart rates in the 40s.  He was therefore  admitted for further management.  Management of the patient's  cardiomyopathy has been limited due to bradycardia and hypotension.  He  continues however to drink at least 2-3 beers per day.  He reports  occasional binge alcohol consumption.  He denies any episodes of  presyncope, syncope, or symptomatic arrhythmias.  He is otherwise  without complaint at this time.   PAST MEDICAL HISTORY:  1. Mixed cardiomyopathy, ejection fraction 34%.  2. Coronary artery disease.  3. Hypertension.  4. Hyperlipidemia.  5. Alcohol abuse.  6. Depression.  7. Morbid obesity.  8. Sleep apnea with CPAP therapy.  9. COPD.  10.Degenerative disk disease.  11.Degenerative joint disease.  12.GERD.   PAST SURGICAL HISTORY:  1. Multiple prior back surgeries with chronic back pain and lumbar      fusion.  2. Umbilical hernia repair.   ALLERGIES:  PENICILLIN.   SOCIAL HISTORY:  The patient lives near [place] with his spouse with  whom he has frequent altercations.  He has a history of chronic tobacco  use, but quit several years ago.  He drinks at least 12-pack per week  and his family estimates that he drinks significantly more than this.  He denies drug use.   FAMILY HISTORY:  Coronary artery disease.   REVIEW OF SYSTEMS:  All systems were reviewed and negative except as  outlined in the HPI above.   PHYSICAL EXAMINATION:  VITALS:  Blood pressure 95/61, heart rate 60,  respirations 18, sats 97% on 2 L, afebrile.  GENERAL:  The patient is a chronically ill, morbidly obese gentleman in  no acute distress.  He is alert and oriented x3.  HEENT:  Normocephalic, atraumatic.  Sclerae clear.  Conjunctivae pink.  Oropharynx clear.  NECK:  Supple.  No JVD, thyromegaly, or bruits.  LUNGS:  Clear to auscultation bilaterally.  HEART:  Regular rate and rhythm with frequent ectopy.  No murmurs, rubs,  or gallops.  GI:  Soft, nontender, nondistended.  Positive bowel sounds.  EXTREMITIES:  No clubbing, cyanosis, or edema.  NEUROLOGIC:  Strength and sensation are intact.  SKIN:  No ecchymosis or lacerations.  MUSCULOSKELETAL:  No deformity or atrophy.  PSYCHIATRY:  Euthymic mood.  Full affect.   EKG reveals sinus rhythm at 70 beats per minute with frequent premature  ventricular contractions of a right bundle-branch, left superior axis  with a QRS width of 124 milliseconds.  The EKG is otherwise  unremarkable.   Cardiac MRI performed this admission reveals an ejection fraction of 34%  with greater than 75% thickness scar of the basal mid inferolateral left  ventricle and akinesis of that segment.  Global  hypokinesis is also  noted.   LABORATORY DATA:  Potassium 4, creatinine 1.1, INR 1, magnesium 2.5.   Transthoracic echocardiogram reveals mild LVH, mild mitral  regurgitation, and a poor window for assessing LV function.   Telemetry reveals sinus rhythm with frequent PVCs occurring occasionally  with couplets, also with nonsustained ventricular tachycardia.   IMPRESSION:  Mr. Greenman is a pleasant 65 year old gentleman with a  history of a mixed cardiomyopathy (ejection fraction 34%), New York  Heart Association class III heart failure chronically, coronary artery  disease, and alcoholism who was admitted with fatigue and decreased  exercise tolerance.  He appears to be on a medicine regimen, which is  optimal for him as further titration is limited by hypotension.  I think  that the patient would significantly benefit from cessation of alcohol  and I have had a long discussion with the patient regarding this.  Given  his multiple comorbidities, I think that alcohol cessation would be  helpful and his ejection fraction could certainly improve.  The patient  does however have a significant scar within his left ventricle.   PLAN:  The patient meets good health criteria for an ICD implantation  for primary prevention of sudden cardiac death.  I have discussed at  length the risks, benefits, and alternatives to ICD implantation with  the patient.  His  options at this point I think are to completely abstain from alcohol and  to reassess his ejection fraction in several months or to proceed with  ICD implantation at this time.  The patient would like to further  contemplate his options and make a decision over the next few days.      Hillis Range, MD  Electronically Signed     JA/MEDQ  D:  12/26/2008  T:  12/26/2008  Job:  [id number]   cc:   Marca Ancona, MD  [facility]

## 2011-01-13 NOTE — Assessment & Plan Note (Signed)
[facility]                          [facility] NOTE   NAME:[name], [name]                      MRN:          [id number]  DATE:05/10/2007                            DOB:          November 04, 1945    PRIMARY CARE PHYSICIAN:  Dr. Samuel Jester.   REASON FOR VISIT:  Cardiac followup.   HISTORY OF PRESENT ILLNESS:  [name] was in the office back in late  July.  He has a history of an ischemic cardiomyopathy with an ejection  fraction in the 45-50% range associated with a chronically occluded  right coronary artery fed by collateral flow and a 60-70% stenosis  within the ramus intermedius that has been treated medically.  He has  dyspnea on exertion at NYHA class II to III at times.  He does state  that he uses his wife's inhalers and this seems to help to some  degree.  He has occasional chest pain, although this has not been  progressive.  Recent assessment shows a BUN and creatinine of 17 and 1.4  with normal liver function tests, sodium of 140, and a potassium of 4.2.  LDL was 82 on the present regimen with triglycerides of 209 and BNP was  normal at 34.  We did refer the patient for a followup chest x-ray,  which demonstrated hyperinflation without any acute changes.  He did  have evidence of previous multiple rib fractures, prominent epicardial  fat pad based on increased density along the right heart border, and  changes suggestive generally of chronic obstructive pulmonary disease.  Pulmonary function test demonstrated a fairly mild obstructive defect  with mildly decreased diffusion capacity.  I reviewed this information  with the patient today.  His medical regimen from the perspective of his  cardiomyopathy is actually fairly reasonable.  He had Altace added back  to his regimen at 5 mg daily, and his blood pressure is borderline low.  It has been low in the past as well.  Today, we talked about some  medication adjustments, a  referral for followup with Dr. Orson Aloe, and  also the followup echocardiogram.   ALLERGIES:  PENICILLIN.   PRESENT MEDICATIONS:  1. Altace 5 mg p.o. daily.  2. Multivitamin daily.  3. Klor-Con 10 mEq p.o. daily.  4. Simvastatin 80 mg p.o. daily.  5. Fenofibrate 160 mg p.o. daily.  6. Percocet p.r.n.  7. Diazepam p.r.n.  8. Aspirin 325 mg p.o. daily.  9. Folic acid 1 mg daily.  10.Nexium 40 mg p.o. daily.  11.Lasix 60 mg p.o. daily.  12.Coreg 12.5 mg p.o. b.i.d.  13.Imdur 60 mg p.o. daily.  14.OxyContin 10 mg p.o. t.i.d.  15.Effexor XR 150 mg p.o. daily.   REVIEW OF SYSTEMS:  As described in the history of present illness.  The  patient is not consistent with his CPAP therapy.  He reports problems  with sleep at night.  He has had no palpitations or syncope.   EXAMINATION:  Blood pressure is 92/67, heart rate is 78, weight 209  pounds.  This is a chronically ill-appearing male, in no acute distress.  HEENT:  Conjunctivae looks normal.  Oropharynx clear.  NECK:  Supple.  No elevated jugular venous pressure.  No loud bruits.  No thyromegaly is noted.  LUNGS:  Exhibit diminished breath sounds.  No wheezing noted.  CARDIAC:  Reveals a regular rate and rhythm.  No S3 gallop.  ABDOMEN:  Soft and nontender.  Normoactive bowel sounds.  EXTREMITIES:  Exhibit no pitting edema.  Distal pulses are 1+.  SKIN:  Warm and dry.  MUSCULOSKELETAL:  No kyphosis noted.  NEURO/PSYCH:  The patient is alert and oriented x3.   IMPRESSION/RECOMMENDATIONS:  1. Ischemic cardiomyopathy with an ejection fraction of 45-50% based      on assessment in April of 2007.  This is in the setting of known      obstructive coronary artery disease that is being managed      medically.  I would like the patient to decrease his Altace to 2.5      mg daily and otherwise continue his cardiac regimen.  A followup 2D      echocardiogram will also be ordered, and we will plan to see the      patient back over the  next few months.  I do not feel strongly      about proceeding with a followup Myoview at this time, as I      anticipate that it will be abnormal based on his known disease, and      symptomatically he seems to be relatively stable.  2. Prior long-standing history of tobacco use with evidence of chronic      obstructive pulmonary disease based on chest x-ray.  Mild defects      are noted on pulmonary function testing.  The patient also has a      history of obstructive sleep apnea, although he is not compliant      with CPAP on a regular basis.  We have made a referral for the      patient to see Dr. Orson Aloe again.  3. Hyperlipidemia with recent LDL cholesterol of 82 on the present      regimen.     Jonelle Sidle, MD  Electronically Signed    SGM/MedQ  DD: 05/10/2007  DT: 05/10/2007  Job #: [id number]   cc:   Lavone Orn

## 2011-01-13 NOTE — H&P (Signed)
NAMEBREVEN, GUIDROZ               ACCOUNT NO.:  [id number]   MEDICAL RECORD NO.:  [id number]          PATIENT TYPE:  INP   LOCATION:  1824                         FACILITY:  [facility]   PHYSICIAN:  Della Goo, M.D. DATE OF BIRTH:  10-29-1945   DATE OF ADMISSION:  12/23/2008  DATE OF DISCHARGE:                              HISTORY & PHYSICAL   PRIMARY CARE PHYSICIAN:  Dr. Charm Barges in [place].   CARDIOLOGIST:  Dr. Diona Browner of [place].   CHIEF COMPLAINT:  Weakness.   HISTORY OF PRESENT ILLNESS:  This is a 65 year old male who presents to  the emergency department emergently secondary to worsening weakness over  the past 24 hours.  The patient reports he has been weak and continuing  to decline over the past 3 months.  He states that he checked his blood  pressure and his pulse today on a home blood pressure monitor and found  his blood pressure to be 67/42 with a heart rate of 40.  The patient's  family members called emergency medical services and had the patient  transported to the hospital for further evaluation.  The patient reports  having nausea, no vomiting.  No diaphoresis.  He denies having any chest  pain or shortness of breath.  He does report having palpitations off and  on.   PAST MEDICAL HISTORY:  1. Hypertension.  2. Hyperlipidemia.  3. Cardiomyopathy.  4. COPD.  5. Congestive heart failure syndrome.  6. Osteoarthritis.  7. Degenerative disk and degenerative joint disease  8. History of coronary artery disease.  9. Previous myocardial infarction.  10.Gastroesophageal reflux disease.  11.History of Bell's palsy in the past.   PAST SURGICAL HISTORY:  1. Three previous back surgeries and has chronic back pain.  2. Lumbar fusion L3-L4 and has had a fusion and redo performed as a      second surgery.  3. Third back surgery was an L5-S1 microdiskectomy with      microdissection.  4. Umbilical hernia repair.   MEDICATIONS:  Furosemide, Carvedilol, diazepam  Crestor or Effexor XR,  lisinopril, omeprazole, OxyContin and oxycodone/APAP.   ALLERGIES:  PENICILLIN WHICH CAUSED A RASH.   SOCIAL HISTORY:  Patient is a nonsmoker currently.  He reports quitting  in 1998.  He does report drinking alcohol.  He reports drinking a 12-  pack weekly.  He states his last drink was at 2:00 p.m. today where he  drink half a beer.   FAMILY HISTORY:  Positive for alcoholism.   REVIEW OF SYSTEMS:  Pertinents are mentioned above in the HPI.  All  other organ systems negative.   PHYSICAL EXAMINATION FINDINGS:  GENERAL:  This is a 65 year old morbidly  obese male in discomfort but no acute distress currently.  VITAL SIGNS:  Temperature 98.1, blood pressure 89-94, respirations 20,  O2 sats 94-98%.  HEENT:  Normocephalic, atraumatic.  There is no scleral icterus.  Pupils  are equally round reactive to light.  Extraocular movements are intact  funduscopic benign oropharynx is clear.  NECK:  Supple full range of motion.  No thyromegaly, adenopathy or  jugular venous distention.  CARDIOVASCULAR: Currently regular rate and rhythm.  No murmurs, gallops  or rubs.  LUNGS:  Clear to auscultation bilaterally.  ABDOMEN:  Positive bowel sounds, soft, nontender, nondistended.  No  hepatosplenomegaly.  EXTREMITIES:  Without cyanosis, clubbing or edema.  NEUROLOGIC:  The patient is alert and oriented x3.  Cranial nerves are  intact.  There are no focal deficits on examination.   LABORATORY STUDIES:  White blood cell count 3.6, hemoglobin 12.1,  hematocrit 35.4, platelets 223, neutrophils 35% lymphocytes 47%.  Sodium  138, potassium 3.6, chloride 100, bicarb 33, BUN 12, creatinine 1.03 and  glucose 107.  Alcohol level less than 5.  Point of care cardiac markers  with a myoglobin of 71.6, CK-MB 1.4, troponin less than 0.05.  Chest x-  ray reveals mild to right basilar atelectasis and COPD changes.  EKG  results will be appended.   ASSESSMENT:  A 65 year old male being  admitted with:  1. Weakness.  2. Ventricular tachycardia and possibly sick sinus syndrome.  3. Cardiomyopathy.  4. Alcoholism.  5. Chronic pain syndrome/chronic back pain and osteoarthritis.  6. Coronary artery disease.  7. Congestive heart failure syndrome.  8. Mild anemia.   PLAN:  The patient will be admitted to the intensive care unit for  cardiac care.  He will continue on the IV Lidocaine drip and IV  lidocaine bolus of 100 mg of 2% Lidocaine had been administered times  one, then he was started on a drip of four drops per hour of a 4 mg per  500 mL unit.  Cardiac enzymes will be continued and the patient will  also be placed on the IV Ativan protocol.  A workup for his severe  weakness will also be started.  The patient will be placed on DVT and GI  prophylaxis, and the patient's home medications have been verified and  continued.  Also, Cardiology has been consulted and is currently seeing  the patient as well.  The cardiology on call for the [facility].     Della Goo, M.D.  Electronically Signed    HJ/MEDQ  D:  12/24/2008  T:  12/24/2008  Job:  [id number]

## 2011-01-16 NOTE — H&P (Signed)
NAME:  [name], URBANI                         ACCOUNT NO.:  [id number]   MEDICAL RECORD NO.:  [id number]                   PATIENT TYPE:  INP   LOCATION:  3021                                 FACILITY:  [facility]   PHYSICIAN:  Danae Orleans. Venetia Maxon, M.D.               DATE OF BIRTH:  Oct 26, 1945   DATE OF ADMISSION:  08/30/2003  DATE OF DISCHARGE:                                HISTORY & PHYSICAL   [name] is a patient of mine who I did a lumbar fusion at L3-4 for  spondylolisthesis in 2002.  Two months ago, he developed significant  intractable left lower extremity pain, and he says this is incapacitating to  him.  He went to the emergency room at [facility] and had an MRI  performed through the emergency room, which demonstrated a far lateral disk  herniation at the L5-S1 level on the left.  He is complaining principally of  leg pain.  Does not have significant back pain.  He says that his back pain  was resolved at the first prior surgery, and he has not had a lot of new  back pain.  His MRI, in addition to showing a fragment of herniated disk  material at the L5-S1 level on the left, did show some significant  degenerative disk disease at the L5-S1 level.  He has a history of  alcoholism and has been drinking but recently stopped drinking.  He has also  recently stopped smoking.   PHYSICAL EXAMINATION:  On examination, he had left sciatic notch tenderness  and walked with an antalgic gait favoring his left lower extremity.  He is  able to stand on his heels and toes.  He has weakness in the left EHL at 4/5  and with left hip adductors at 4/5.  Straight leg raise was positive at 45  degrees on the left.  He had decreased pin sensation in the left L5-S1  distribution.  Deep tendon reflexes were 2 in the biceps, triceps, and  brachial radialis, 2 at the knees, 2 at the right ankle and left ankle.  Great toes are downgoing to plantar stimulation.  GENERAL:  The remainder of his  physical examination revealed an obese middle-  aged man in no acute distress.  CHEST:  Clear to auscultation.  HEART:  Regular rate and rhythm without murmur.  ABDOMEN:  Soft and nontender.  Obese.  Active bowel sounds.  No  hepatosplenomegaly appreciated.  No ascites appreciated.  EXTREMITIES:  Without clubbing, cyanosis or edema.   IMPRESSION:  [name] is a 65 year old man with far lateral disk  herniation to the L5-S1 level with intractable pain.  He is admitted on a  same-day-procedure basis for microdiskectomy.  The risks and benefits of the  surgery were explained to the patient in detail.  We will proceed.  Danae Orleans. Venetia Maxon, M.D.    JDS/MEDQ  D:  08/30/2003  T:  08/30/2003  Job:  [id number]

## 2011-01-16 NOTE — Op Note (Signed)
[name], [name]               ACCOUNT NO.:  [id number]   MEDICAL RECORD NO.:  [id number]          PATIENT TYPE:  AMB   LOCATION:  DAY                           FACILITY:  [facility]   PHYSICIAN:  Dalia Heading, M.D.  DATE OF BIRTH:  1945-10-21   DATE OF PROCEDURE:  04/09/2006  DATE OF DISCHARGE:                                 OPERATIVE REPORT   PREOPERATIVE DIAGNOSIS:  Umbilical hernia.   POSTOPERATIVE DIAGNOSIS:  Umbilical hernia.   PROCEDURE:  Umbilical herniorrhaphy.   SURGEON:  Dr. Franky Macho.   ANESTHESIA:  General.   INDICATIONS:  The patient is a 65 year old white male who presents with an  umbilical hernia.  Risks and benefits of the procedure including bleeding,  infection, and recurrence of the hernia were fully explained to the patient,  who gave informed consent.   PROCEDURE NOTE:  The patient was placed in supine position.  After general  anesthesia was administered, the abdomen was prepped and draped using the  usual sterile technique with Betadine.  Surgical site confirmation was  performed.   An infraumbilical incision was made down to the fascia.  The umbilicus was  freed away from the underlying hernia sac.  The hernia sac was excised.  A  small portion of omentum that was adherent to the hernia sac was also  excised.  The fascia was then reapproximated using 0 Surgidek interrupted  sutures.  The base of the umbilicus was secured back to the fascia using a 2-  0 Vicryl interrupted suture.  Subcutaneous layer was reapproximated using a  3-0 Vicryl interrupted suture.  The skin was closed using staples.  Sensorcaine 0.5% was instilled into the surrounding wound.  Betadine  ointment and dry sterile dressing were applied.   All tape and needle counts were correct at the end of the procedure.  The  patient was extubated in the operating room and went back to recovery room  awake in stable condition.   COMPLICATIONS:  None.   SPECIMEN:  None.   BLOOD  LOSS:  Minimal.      Dalia Heading, M.D.  Electronically Signed     MAJ/MEDQ  D:  04/09/2006  T:  04/09/2006  Job:  [id number]   cc:   Samuel Jester  Fax: [phone]

## 2011-01-16 NOTE — Op Note (Signed)
[facility]  Patient:    [name], [name]                        MRN: [id number] Proc. Date: 01/07/01 Attending:  Danae Orleans. Venetia Maxon, M.D.                           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  PROCEDURES:  Redo decompression L3-4 with bilateral diskectomies L3-4 with T-LIF bone fusion (7 mm Synthes allograft spacer), with local bone autograft, pedicle screw fixation L3 through L4 bilaterally, with SV90 pedicle screw system, with local bone autograft and Vitoss bone allograft supplement.  SURGEON:  Danae Orleans. Venetia Maxon, M.D.  ASSISTANTMarland Kitchen  Mena Goes. Franky Macho, M.D.  ANESTHESIA:  General endotracheal anesthesia.  ESTIMATED BLOOD LOSS:  650 cc, 350 cc of blood returned from [facility].  COMPLICATIONS:  None.  DISPOSITION:  Recovery.  INDICATIONS:  [name] is a 65 year old man with bilateral lower extremity pain, with a 1.3 cm spondylolisthesis of L3 on L4, with biforaminal stenosis of L3 and L4.  He had previously undergone lumbar fusion and decompression and developed a pars defect and subsequently significant spondylolisthesis.  He had significant bilateral lower extremity pain, which was becoming intractable, and it was elected to take him to surgery for decompression and fixation.  DESCRIPTION OF PROCEDURE:  [name] was brought to the operating room. Following successful and uncomplicated induction of general endotracheal anesthesia and placement of intravenous lines and a Foley catheter, he was placed in the prone position on the operating table.  His soft tissue and bony prominences were padded appropriately.  C-arm was draped into the field.  His low back was then  prepped and draped in the usual sterile fashion, and the area of planned incision was infiltrated with 0.25% Marcaine and 0.5% lidocaine with 1:200,000 epinephrine.  Incision was made in the midline over the L2 through L5 spinous processes, carried through scar tissue to the lumbodorsal fascia, which was then incised bilaterally with electrocautery. Subperiosteal dissection was performed, exposing the L2 laminae.  The near-complete laminectomy of L3 had been previously performed, and a total laminectomy of L4 had been performed previously.  The transverse processes of L3 and L4 were identified bilaterally.  There was significant spondylolisthesis.  The transverse processes were much closer together on the left side than on the right.  Self-retaining Versatrac retractors were placed to facilitate exposure, and intraoperative fluoroscopic visualization confirmed the appropriate level.  Using loupe magnification, the scar tissue was very carefully removed from overlying the thecal sac, and the L3 nerve roots were decompressed widely as they extended out their neural foramina. They were very severely compressed, worse on the left than on the right, where there was a significant amount of scar tissue around the nerve root. Initially on the right side, diskectomy was performed using a laminar spreader on the left side.  The L3 nerve root was mobilized on the right side, and the disk space was incised.  There was a shelf of bone overlying the interspace, which was removed with a chisel, and the disk space was then entered and it was found to be very degenerated without much disk material present at all. The end plates  were vigorously curetted and scraped with box-cutting curettes. Disk material was removed.  Subsequently attention was then turned to the left side, where similar decompression was performed.  The scar tissue was very carefully mobilized around the L3 nerve root, and the disk space  was entered using a small chisel to remove this shelf, a similar shelf of bone overlying the interspace.  After both nerve roots were felt to be well-decompressed, the laminar spreader was again replaced to the left side, and a 7 mm trial spacer was placed and found to fit appropriately within the interspace.  A 7 mm bone T-LIF Synthes bone dowel was then placed after placing morcellized local bone autograft ventral to the spacer.  The additional morcellized autograft was then placed dorsal to the spacer after its position was confirmed under x-ray visualization.  Pedicle screws 6.75 x 45 mm were then placed, two at L3 and two at the L4 level.  The trajectory of screws and their position were confirmed using fluoroscopic visualization and also intraoperatively.  This was done in both an AP and lateral plane.  The spinal canal was inspected and palpated and found to be, there were no violations of the pedicles.  The nerve roots were well-decompressed.  Vitoss bone substitute was reconstituted with 9 cc of marrow aspirate, which was obtained in 3 cc aliquots from different positions within the pedicle.  Subsequently the bone graft was then packed laterally over the transverse processes, which were decorticated from L3 through L4 levels.  Forty millimeter rods were then inserted over the variable-angle screw heads and tightened in situ with locking caps.  The construct appeared to be very stable.  A final x-ray confirmed positioning of bone graft and pedicle screws.  The wound was then closed with interrupted 1 Vicryl sutures reapproximating the fascia, 2-0 interrupted Vicryl sutures reapproximating the subcutaneous tissues, and 3-0 Vicryl subcuticular stitch in interrupted fashion reapproximating the skin edges.  The wound was dressed with benzoin and Steri-Strips, Telfa gauze, and tape.  Prior to closure, the wound was copiously irrigated with bacitracin and saline.  The patient appeared to  tolerate the procedure well.  He was returned to a supine position on the operating table, taken to the recovery room in stable and satisfactory condition. DD:  01/07/01 TD:  01/08/01 Job: [id number]

## 2011-01-16 NOTE — Op Note (Signed)
NAME:  [name], [name]                         ACCOUNT NO.:  [id number]   MEDICAL RECORD NO.:  [id number]                   PATIENT TYPE:  INP   LOCATION:  3021                                 FACILITY:  [facility]   PHYSICIAN:  Danae Orleans. Venetia Maxon, M.D.               DATE OF BIRTH:  04-06-1946   DATE OF PROCEDURE:  08/30/2003  DATE OF DISCHARGE:                                 OPERATIVE REPORT   PREOPERATIVE DIAGNOSIS:  Far lateral herniated lumbar disk at L5-S1 left  with spondylosis, degenerative disk disease and radiculopathy.   POSTOPERATIVE DIAGNOSIS:  Far lateral herniated lumbar disk at L5-S1 left  with spondylosis, degenerative disk disease and radiculopathy.   OPERATION PERFORMED:  Far lateral microdiskectomy, L5-S1 left with  microdissection.   SURGEON:  Danae Orleans. Venetia Maxon, M.D.   ASSISTANT:  Payton Doughty, M.D.   ANESTHESIA:  General endotracheal.   ESTIMATED BLOOD LOSS:  Minimal.   COMPLICATIONS:  None.   DISPOSITION:  Recovery.   INDICATIONS FOR PROCEDURE:  [name] is a 65 year old man on whom I had  previously done a lumbar fusion at the L3-4 level for significant  spondylolisthesis.  He has now developed a two-month history of increasingly  severe left leg pain which made him unable to stand or walk and he has  essentially been bedridden.  An MRI done through the emergency room at [facility]  hospital demonstrated a large fragment of disk material directly beneath the  L5 nerve root on the left and far lateral position at the L5-S1 level.  It  was elected to take him to surgery for microdiskectomy.   DESCRIPTION OF PROCEDURE:  The patient was brought to the operating room.  Following the satisfactory and uncomplicated induction of general  endotracheal anesthesia and placement of intravenous lines, the patient was  placed in a prone position on the operating table in a Wilson frame.  His  low back was then prepped and draped in the usual sterile fashion.  The area  of  planned incision was infiltrated with 0.25% Marcaine and 0.5% lidocaine  with 1:200,000 epinephrine.  An incision was made in the midline and carried  laterally on the left to identify the L5 transverse process and the sacral  ala.  The L5 pars was identified.  Intraoperative x-ray confirmed correct  orientation with a marker probe at the level of the L5 pedicle.  Subsequently, using the high speed drill under loupe magnification, the  lateral pars of L5 was drilled down, as was the superolateral facet joint  complex at the L5-S1 level.  A microscope was then brought into the field  and using 3 mm Kerrison rongeurs, the intertransverse ligament was removed  and the L5 nerve root was identified as it coursed laterally.  The nerve  root was seen to be bowed cephalad and using microdissection technique, the  nerve root was mobilized.  There was a large  amount of very degenerated disk  material which was herniated directly compressing the L5 nerve root.  Using  micropituitary, multiple fragments of disk material were removed and using a  variety of micro nerve hooks the nerve root was mobilized and multiple  additional fragments of disk material were removed.  At the conclusion of  this, the nerve root appeared to be significantly decompressed and had a  much more normal course.  Hemostasis was assured with Gelfoam soaked in  thrombin.  The nerve root was bathed in 80 mg of Depo-Medrol and 2 mL of  fentanyl.  The self-retaining retractor was removed.  The lumbodorsal fascia  was closed with 0 Vicryl suture.  Subcutaneous tissue was reapproximated  with 2-0 Vicryl interrupted inverted sutures and the skin edges were  reapproximated with interrupted 3-0 Vicryl subcuticular stitch.  The wound  was dressed with Dermabond and the patient was extubated in the operating  room and taken to the recovery room in stable and satisfactory condition,  having tolerated the operation well.  All counts were  correct at the end of  the case.                                               Danae Orleans. Venetia Maxon, M.D.    JDS/MEDQ  D:  08/30/2003  T:  08/31/2003  Job:  [id number]

## 2011-01-16 NOTE — H&P (Signed)
[facility]  Patient:    [name], [name]                      MRN: [id number] Adm. Date:  [id number] Attending:  Otilio Saber Dictator:   Johnella Moloney, N.P.                         History and Physical  IDENTIFYING INFORMATION:  Patient is a 65 year old white married male admitted March 11, 2000 on a voluntary basis for detoxification from alcohol and depressive symptoms.  Patient apparently had been sober for four months after his alcohol detoxification at [facility] until a family reunion on February 25, 2000.  He states the family reunion was kind of depressing because a lot of people had died, some of the younger folks were drinking and he started drinking again at that time.  Apparently, he went to [facility] continuously drinking at least a 12-pack of beer per day for the last two weeks.  While at [facility], he fell into a swimming pool, hit his head and chipped a lower tooth and has had a persistent headache since then.  He came back from [facility], presented to [facility] where a complete physical examination was done.  There was apparently no loss of consciousness when he hit his head. He denies having any visual or tactile hallucinations.  He denies any paranoid ideation.  He does complain of a loss of energy, poor sleep and appetite has been fine.  He said some increased irritability.  PAST PSYCHIATRIC HISTORY:  Patient has been at [facility] four months ago.  He also has attended AA in the past.  Otherwise, he has had no previous treatment for alcoholism.  PAST MEDICAL HISTORY:  Patient sees Dr. Johnsie Cancel in [place], [place].  Medical problems include coronary artery disease, left-sided facial Bells palsy, hypertension, hypercholesterolemia, chronic low back pain.  He has had surgery including a back fusion for a herniated disk, surgery in both knees and and pinning of his right leg after a  fracture.  CURRENT MEDICATIONS: 1. Baycol 0.4 mg q.h.s. 2. Toprol XL 25 mg p.o. q.h.s. 3. Claritin 10 mg p.o. q.a.m. 4. Prevacid 30 mg p.o. q.a.m. 5. Aspirin 325 mg p.o. q.a.m. 6. He takes Imdur 40 mg p.r.n. for chest pain only up to one time a day.  This    was prescribed by Dr. Orvan Falconer in [place].  DRUG ALLERGIES:  Initially, patient says he has no known drug allergies but he is allergic to POLLEN.  He also states, if he takes PENICILLIN, he has hives.  SOCIAL HISTORY:  Patient has been married for 35 years but then he tells me he and his wife are sort of separated for a month.  They live [place] the same trailer [place].  Three children.  One did die at the age of 63 as a result of alcohol poisoning in 1984.  He is on disability due to heart disease.  He has one half-sister.  His parents are deceased.  He completed the ninth grade.  FAMILY HISTORY:  Apparently a lot of alcoholism in the family.  His son died as a result of alcohol.  ALCOHOL AND DRUG HISTORY:  Patient has been drinking alcohol since the age of 62.  Denies substance abuse.  Quit smoking in 1998.  PHYSICAL FINDINGS:  Please see physical examination done at [facility] on  March 11, 2000 with no significant findings.  They did do a CT scan which was normal.  His CBC was 46, hemoglobin 14.5, hematocrit 43.6.  He did have a MPV decreased at 7.1.  His PT was 12.1; per time ratio of 1.0.  INR ratio of 1.0.  PTT 28.2.  Glucose 109, CO2 increased at 29.0.  Blood alcohol level was 301.  MENTAL STATUS EXAMINATION:  Middle-aged white male lying in bed.  Cooperative with no obvious tremors at this point in time.  Speech normal tone and relevant.  Mood anxious.  Affect is anxious.  No suicidal or homicidal ideation.  Thought process logical and coherent without evidence of psychosis. No hallucinations.  No delusions.  Cognitive:  Alert and oriented.  Cognitive function is intact.  CURRENT DIAGNOSES: Axis I:    1.  Alcohol dependence.            2. Depressive disorder not otherwise specified. Axis II:   Deferred. Axis III:  1. Status post contusion of head.            2. Left-sided facial Bells palsy.            3. Coronary artery disease.            4. Hypercholesterolemia.            5. Hypertension.            6. Chronic low back pain. Axis IV:   Severe related to problems related to his substance abuse. Axis V:    Current GAF 35; highest in past year 60.  TREATMENT PLAN AND RECOMMENDATIONS:  Voluntary admission to [facility].  Our goal will be to maintain his safety.  Check every 15 minutes.  We will detox using the high dose Librium protocol.  Force Gatorade. Toprol XL 25 mg p.o. q.a.m., Baycol 0.4 mg p.o. q.h.s., Imdur 40 mg p.r.n. once a day for chest pain, aspirin 325 mg 1 p.o. q.a.m., Claritin 10 mg p.o. q.a.m.  TENTATIVE LENGTH OF STAY AND DISCHARGE PLAN:  Three days. DD:  03/12/00 TD:  03/13/00 Job: [id number]

## 2011-01-16 NOTE — Discharge Summary (Signed)
[facility]  Patient:    [name], [name]                      MRN: [id number] Adm. Date:  [id number] Disc. Date: [id number] Attending:  Otilio [name] CC:         [facility]   Discharge Summary  BRIEF HISTORY:  [name] is a 65 year old white married male, admitted with a history of alcohol abuse, depression and for detoxification.  The patient reported being sober for four months after his last detox at [facility].  He had a family reunion on February 25, 2000, and started drinking again.  He then went to [facility] where he was drinking at least a 12-pack of beer per week for two weeks.  While at [facility], he fell into a swimming pool, hitting his head and chipping a lower tooth.  He presented to [facility] after returning home and was medically cleared.  He reported decreased energy, decreased sleep, and increased irritability.  PAST MEDICAL HISTORY:  The patient had been at [facility] four months previously for detoxification.  He is followed medically by [name] in [place], [place].  He has a history of coronary artery disease, left-sided Bells palsy, hypertension, hypercholesterolemia, and chronic low-back pain.  Surgeries in the past include a back infusion, surgery on both knees, and pinning of his right leg after a fracture.  MEDICATIONS:  At the time of admission he was on: 1. Baycol 0.4 mg q.h.s. 2. Toprol XL 25 mg q.h.s. 3. Claritin 10 mg q.a.m. 4. Prevacid 30 mg q.a.m. 5. Aspirin 325 mg q.a.m. 6. Imdur 40 mg p.r.n.  ALLERGIES:  He reported being allergic to PENICILLIN.  PHYSICAL EXAMINATION:  Physical examination was performed at [facility] with no significant findings.  He had a CT brain scan which was normal.  MENTAL STATUS EXAMINATION:  Revealed a middle-aged white male.  He had no obvious tremors.  Speech was normal.  Thought processes were logical  and coherent without evidence of psychosis.  He denied suicidal ideation.  Mood was anxious.  Affect was anxious.  Oriented x 3.  Cognitive functioning was intact.  ADMITTING DIAGNOSES: Axis I:    Alcohol dependence, depression, not otherwise specified. Axis II: Deferred. Axis III:  1. Status post contusion of head.            2. Left-sided Bells palsy.            3. Coronary artery disease.            4. Hypercholesterolemia.            5. Hypertension.            6. Chronic low-back pain. Axis IV:   Psychosocial stressors, severe. Axis V:    Global assessment of functioning, current was 35, highest this past            year was 60.  HOSPITAL COURSE:  The patient was admitted to the [facility] for treatment of his depression and for alcohol detox.  He was initially detoxed with a low-dose Librium protocol.  The patient still complained of feeling depressed and continued to complain of his chronic back pain.  He tended to downplay his alcohol abuse.  We elected to try him on Neurontin and continue with a Librium taper.  The patient had no severe withdrawal symptoms and we gradually increased his  Neurontin.  He reported improvement in his mood and affect and some decrease in his pain complaints.  It was felt that he had stabilized sufficiently and that he could managed on an outpatient basis.  CONDITION ON DISCHARGE:  The patient is discharged in improved condition with improvement in his mood, successful detoxification of alcohol, and normalization in his sleep and appetite.  DISPOSITION:  The patient is discharged to home.  He is to follow up at the [facility] on March 23, 2000.  DISCHARGE MEDICATIONS: 1. Neurontin 100 mg b.i.d. and 400 mg q.h.s. 2. Baycol 0.4 mg q.h.s. 3. Claritin 10 mg q.a.m. 4. Toprol XL 25 mg q.d. 5. Aspirin q.d.  He was instructed to attend AA on a daily basis.  FINAL DIAGNOSES: Axis I:    Alcohol dependence,  depression, not otherwise specified. Axis II:   No diagnosis. Axis III:  1. Bells palsy.            2. Coronary artery disease.            3. Hypercholesterolemia.            4. Hypertension.            5. Chronic low-back pain. Axis IV:   Psychosocial stressors, severe. Axis V:    Global assessment of functioning, current 52, highest this past            year is 60. DD:  05/05/00 TD:  05/06/00 Job: [id number]

## 2011-01-16 NOTE — H&P (Signed)
NAME:  [name], [name]               ACCOUNT NO.:  [id number]   MEDICAL RECORD NO.:  [id number]           PATIENT TYPE:  AMB   LOCATION:                                FACILITY:  [facility]   PHYSICIAN:  Dalia Heading, M.D.  DATE OF BIRTH:  1946-04-16   DATE OF ADMISSION:  DATE OF DISCHARGE:  LH                                HISTORY & PHYSICAL   CHIEF COMPLAINT:  Umbilical hernia.   HISTORY OF PRESENT ILLNESS:  The patient is a 65 year old white male who is  referred for evaluation and treatment of a ventral hernia.  It has been  present for some time, but is causing him increasing discomfort.  It is made  worse with straining.   PAST MEDICAL HISTORY:  1.  ETOH abuse.  2.  Chronic back problems.  3.  Hypertension.   PAST SURGICAL HISTORY:  1.  Back surgery.  2.  Sinus surgery.   CURRENT MEDICATIONS:  Isosorbide, Coreg, furosemide, Altace, Nexium, Zocor,  Zetia, oxycodone, Tricor, folic acid, aspirin, garlic, Percocet, diazepam.   ALLERGIES:  PENICILLIN.   REVIEW OF SYSTEMS:  Patient denies smoking.  Does drink beer daily.   PHYSICAL EXAMINATION:  GENERAL:  Patient is a well-developed, well-nourished  white male in no acute distress.  HEENT:  No scleral icterus.  LUNGS:  Clear to auscultation with equal breath sounds bilaterally.  HEART:  Regular rate and rhythm without S3, S4, or murmurs.  ABDOMEN:  Soft, nontender, nondistended.  A diastasis recti is present.  An  umbilical hernia is reducible and present.  Bilateral inguinal hernias are  present, but small and asymptomatic.  No hepatosplenomegaly is noted.   IMPRESSION:  Umbilical hernia.   PLAN:  The patient is scheduled for an umbilical herniorrhaphy on April 09, 2006.  The risks and benefits of the procedure including bleeding,  infection, and recurrence of the hernia were fully explained to the patient,  gave informed consent.  There is no need to repair the diastasis recti as  this is a normal finding.      Dalia Heading, M.D.  Electronically Signed     MAJ/MEDQ  D:  04/06/2006  T:  04/06/2006  Job:  [id number]   cc:   Samuel Jester  Fax: [phone]

## 2011-01-16 NOTE — Op Note (Signed)
[place]  Patient:    [name], [name]                      MRN: [id number] Proc. Date: 01/07/01 Adm. Date:  [id number] Disc. Date: [id number] Attending:  Josie Saunders                           Operative Report  REASON FOR ADMISSION:  L3-L4 spondylolisthesis, spondylosis, bilateral pars defect (spondylysis).  SECONDARY DIAGNOSES: 1. Hypertension. 2. Prior Bells palsy. 3. Coronary atherosclerosis. 4. Old myocardial infarct. 5. History of depressive disorder. 6. Alcohol abuse in remission. 7. Esophageal reflux.  FINAL DIAGNOSES: 1. L3-L4 spondylolisthesis, spondylosis, bilateral pars defect (spondylysis). 2. Hypertension. 3. Prior Bells palsy. 4. Coronary atherosclerosis. 5. Old myocardial infarct. 6. History of depressive disorder. 7. Alcohol abuse in remission. 8. Esophageal reflux.  HISTORY OF PRESENT ILLNESS:  [name] is a 65 year old disabled man who was disabled secondary to heart disease in 1998 with 100% coronary artery blockage in one artery and 65% blockage in another artery who presented to my office on December 08, 2000, with the complaint of bilateral lower extremity pain and numbness that was interfering with his daily function.  He had a prior lumbar fusion by Dr. Cleophas Dunker in 1987.  He had preoperative MRI which demonstrates 13 mm of anterolisthesis of L3-L4 with severe discogenic sclerosis and significant L3 nerve root compression bilaterally.  The patient had significant incapacitating pain.  It was elected to take him to surgery for decompression and posterior lumbar fixation.  HOSPITAL COURSE:  This was performed on same-day-as-admission basis.  The patient had significant improvement in his lower extremity pain.  He was slow to mobilize.  Was doing well as of May 15.  He had some problems with obstipation/constipation while in the hospital, but this resolved.  He was discharged home on May 15 in stable and  satisfactory condition having tolerated surgical procedure well.  DISCHARGE MEDICATIONS: 1. Percocet one or two q.4h. p.r.n. pain along with his regular medications. 2. Valium 5 mg q.6h. p.r.n. muscle spasms.  DISCHARGE INSTRUCTIONS:  No lifting, bending, twisting, driving.  Wear his brace when up.  DISCHARGE FOLLOWUP:  Follow up in three weeks with Dr. Venetia Maxon with an AP and lateral lumbar spine x-ray. DD:  01/28/01 TD:  01/28/01 Job: [id number]

## 2011-01-16 NOTE — Cardiovascular Report (Signed)
NAME:  [name], [name]                         ACCOUNT NO.:  [id number]   MEDICAL RECORD NO.:  [id number]                   PATIENT TYPE:  OIB   LOCATION:  6501                                 FACILITY:  [facility]   PHYSICIAN:  Learta Codding, M.D. [facility]             DATE OF BIRTH:  [birth year]   DATE OF PROCEDURE:  02/18/2004  DATE OF DISCHARGE:                              CARDIAC CATHETERIZATION   PROCEDURE PERFORMED:  1. Left heart catheterization with selective coronary angiography.  2. Ventriculography.   DIAGNOSES:  1. Two-vessel coronary artery disease with occluded right coronary artery     and collateral flow.  2. Left ventricular systolic dysfunction.  Ejection fraction 35-40%.   INDICATION:  The patient is a 65 year old male with history of coronary  artery disease status post remote myocardial infarction.  The patient is  status post cardiac catheterization by Dr. Elease Hashimoto when he was found to have  an occluded RCA.  The patient underwent a Cardiolite study in 2002 and was  found to have no evidence of ischemia, but large inferior scar.  The patient  presented to Dr. Regino Schultze with intermittent chest discomfort and decision was  made to proceed with coronary angiography.   DESCRIPTION OF PROCEDURE:  After informed consent was obtained, the patient  was brought to the [facility] catheterization laboratory.  4-French arterial sheath  was placed using the modified Seldinger technique.  4-French JL-4 and a 4-  [place] No-Torque catheters were used for coronary angiography.  Pigtail  catheter 4-French was used for ventriculography.  At the termination of  procedure all catheters and sheaths were removed and no complications were  encountered during the procedure.  The patient was brought back to the  holding area and adequate hemostasis was obtained.   FINDINGS:   HEMODYNAMICS:  Left ventricular pressure 123/15 mmHg, aortic pressure 123/78  mmHg.   VENTRICULOGRAPHY:  Ejection fraction  35-40% with large area of inferior  akinesis.  Unable to assess mitral regurgitation, but there is likely some  degree of mitral regurgitation.   SELECTIVE CORONARY ANGIOGRAPHY:  1. Left main coronary artery:  Large caliber vessel with no evidence of flow-     limiting disease.  2. Left anterior descending artery:  This was a large caliber vessel     wrapping around the apex.  There was diffuse proximal stenosis of 20-30%     diagonal branches which were small and free of flow-limiting disease.  3. Ramus:  This was a large vessel with proximal 60-70% stenosis.  This was     somewhat difficult to visualize given the use of 4-French catheters, but     it did not appear to be critical flow limiting.  4. The circumflex coronary artery is a moderate size vessel with no evidence     of flow-limiting disease.  5. The right coronary artery was occluded in its proximal to mid segment.  There were large right-to-right bridging collaterals filling the distal     vessel.  Of note, there was also distal vessel through left-to-right     collaterals originating from the ramus and circumflex branches.  6. Collateral flow:  Small collaterals from the ramus branch and marginal     branches filling the distal right coronary artery and PDA branch.  There     is more extensive bridging collaterals from right-to-right filling the     distal RCA.   RECOMMENDATIONS:  It appears that the patient's coronary anatomy has not  significantly changed since 1998.  There is evidence of prior inferior wall  myocardial infarction with occluded RCA with what seems adequate collateral  flow.  However, there is a 60-70% proximal ramus lesion that could  potentially be a source of her ischemia.  Review of the angiographic images,  however, does not appear to be flow limiting and I would recommend an  outpatient exercise Cardiolite in the next couple of weeks to further assess  its significance.  It appears the patient's  symptoms are somewhat atypical  and predominant complaints are related to congestive heart failure.  Given  his poor left ventricular ejection fraction, he has been started on Lasix  and lisinopril.  In one week a BMET has ordered by Dr. Diona Browner at which  point titration of medications can be arranged.                                               Learta Codding, M.D. [facility]    GED/MEDQ  D:  02/18/2004  T:  02/18/2004  Job:  [id number]   cc:   Dr. Charm Barges

## 2011-01-16 NOTE — H&P (Signed)
[facility]  Patient:    TAM, DELISLE                        MRN: [id number] Adm. Date:  01/07/01 Attending:  Danae Orleans. Venetia Maxon, M.D.                         History and Physical  REASON FOR ADMISSION:  Lumbar spondylolisthesis with spondylolysis L3-4 with bilateral lumbar radiculopathy, severe degenerative disc disease and spondylosis.  HISTORY OF PRESENT ILLNESS:  Higinio Grow is a 65 year old right handed, disabled man who was rendered disabled secondary to heart disease in 1998 with 100% coronary artery blockage in one artery and 65% blockage in another artery, who presented to me in my office on December 08, 2000 with a complaint of bilateral lower extremity pain and numbness which he says was interfering with his ability to function.  He says both his legs go numb to his knees but not into his feet.  He notes some weakness in his feet.  He also says it is difficult for him walk any distance and that he can stand only for five minutes before he gets pain and numbness. He has decreased numbness with leaning forward.  He has decreased discomfort when he lays flat in bed.  He says his left leg bothers him more than his right.  He notes decreased ability to urinate and says that previously he saw a urologist who told him it was not his prostate but does not feel this has changed recently.  Mr. Dobek underwent prior lumbar fusion by Dr. Cleophas Dunker in 1987.  He is not actually sure what was done.  He says that he had a herniated disc and subsequently had a fusion and that at that point, his left leg was numb to his knee and he has low back pain.  He says that his low back pain was better after the surgery but his leg discomfort did not really improve much.  Mr. Kyler has been taking OxyCodone 5/325 for pain and says that this has helped him some but it has not fully relieved his pain.  He previously was a very heavy drinker but stopped five months  ago and has not had anything to drink since then.  He was also a smoker but stopped in November 1998 and has not smoked since then.  He says that his right leg is shorter than his left. He was evaluated for possible vascular disease and had ankle brachial indices which we were told were okay. He previously saw Dr. Elease Hashimoto but switched to Dr. Myrtis Ser who sees him in [place].  He feels that he is getting worse and as the pain is sufficiently severe, he wants something to be done if it can be done to relieve his pain and numbness.  REVIEW OF SYSTEMS:  Detailed review of systems sheet was reviewed with the patient. Pertinent positives include:  Eyes:  He wears glasses.  Ear, nose, mouth and throat:  He notes sinus problems.  Cardiovascular: He notes occasional chest pain and angina, high blood pressure, high cholesterol, leg pain with walking. Last EKG was May 2001 but then he underwent cardiac clearance prior to this planned surgical intervention.  Musculoskeletal: He notes that he had broken right femur in the past and leg weakness, back pain, leg pain, arthritis. Psychiatric: He notes occasional depression.  Endocrine: He notes increased appetite.  Hematologic/lymphatic:  He had a blood transfusion in 1987.  PAST MEDICAL HISTORY:  Current medical conditions: High blood pressure, heart attack, sinus surgery in 1998, Bells palsy and facial numbness.  Prior operations and hospitalizations:  He had surgery by Dr. Zola Button T. Wolick in 1998.  Left knee surgery by Dr. Paul Dykes. Grant Ruts. in 1980. He broke his right leg in 1976.  Low back fusion by Dr. Cleophas Dunker in 1987.  Surgery on his right knee cartilage in 1996.  CURRENT MEDICATIONS:  Prevacid 30 mg daily.  Toprol XL 25 mg daily.  Iso-Bid 40 mg daily. Wellbutrin 150 mg one b.i.d.  Zoloft 25 mg q.h.s.  Diazepam 10 mg twice daily.  Percocet 5/325 two times daily.  Zocor 20 mg daily. Aspirin one daily.  Vitamin C 500 mg daily.  Vitamin E 400  international units daily.  Nutritional supplements.  ALLERGIES:  PENICILLIN which causes a rash.  HEIGHT AND WEIGHT:  He is 5 feet 11 inches and 190 pounds.  FAMILY HISTORY:  Both parents are deceased, cause unspecified.  Wife has diabetes and also back problems for which she sees Dr. Channing Mutters.  She previously had neck surgery by Dr. Channing Mutters. She has significant coronary artery disease additionally.  SOCIAL HISTORY:  Mr. Zingale is married.  He is disabled. Prior significant alcohol history and history of smoking.  DIAGNOSTIC STUDIES:  Mr. Harriger initially did not present with any diagnostic studies.  I subsequently was able to obtain a report of a lumbar spine x-ray series which was obtained November 01, 2000 at [facility] which shows he had a prior lumbar fusion at L4-5 level with severe disc narrowing at L3-4 with 13 mm of anterolisthesis of L3 and L4 with severe discogenic sclerosis. It was also felt that a chronic discitis could produce this appearance.  PHYSICAL EXAMINATION:  GENERAL APPEARANCE:  Mr. Prevette is a moderately uncomfortable white male.  HEENT:  Normocephalic and atraumatic.  The pupils are equal, round and reactive to light.  Extraocular movements are intact.  Sclerae are white. Conjunctivae are pink. Oropharynx benign.  Uvula midline.  He has left peripheral fascial nerve palsy and also anhidrosis on the left.  NECK:  There are no masses, meningismus deformities, tracheal deviation, jugular vein distention or carotid bruits.  There is a normal cervical range of motion.  Spurlings test is negative without reproducible radicular pain during the patients head to either side.  Lhermittes sign is not present with axial compression.  RESPIRATORY:  There is normal respiratory effort with good intercostal function.  Lungs are clear to auscultation. There are rales, rhonchi or wheezes.  CARDIOVASCULAR:  The heart has regular rate and rhythm to auscultation. No murmurs  are appreciated.  There is no extremity edema, clubbing or cyanosis. There are palpable pedal pulses.  He has a healed sternotomy incision.   ABDOMEN: Soft and nontender.  No hepatosplenomegaly appreciated or masses. There are active bowel sounds.  No guarding or rebound.  MUSCULOSKELETAL:  He has a healed long lumbar incision and right hip graft incision.  He has minimal low back pain to palpation. He is able to bend to within 4 inches of the floor.  He has no sciatic notch discomfort.  Straight leg raise is negative.  NEUROLOGIC:  The patient was is oriented to time, person and place.  He has good recall of both recent and remote memory with normal attention span and concentration.  The patient speaks with clear and fluent speak and exhibits normal language  function and appropriate fund of knowledge.  CRANIAL NERVE EXAMINATION: Pupils are equal, round and reactive to light. Extraocular movements are full. Visual fields are full to confrontational testing.  Hearing is intact to finger rub.  Palate is upgoing. Shoulder shrug is symmetric.  Tongue protrudes in the midline.  Left Bells palsy and anhidrosis on the left.  He does not have a Bells phenomenon on the left. He had previously had Bells palsy on the right but this is improved.  He notes some numbness in his left face diffusely.  MOTOR EXAMINATION:  Motor strength is 5/5 in bilateral deltoids, biceps, triceps, hand grips, wrist extensors, interossei.  In the lower extremities motor strength is 5/5 in hip flexion, extension, quadriceps, hamstrings, plantar flexion, dorsiflexion, extensor hallucis longus.  SENSORY EXAMINATION:  He has decreased pin sensation to the mid foot on the left and to the ankle on the right associated with decreased vibratory sensation in his feet.  Deep tendon reflexes are 1 in the biceps, triceps and brachial radialis, 2 at the knees, absent at the ankles.  Great toes are downgoing to plantar  stimulation.  CEREBELLAR EXAMINATION:  Normal coordination in the upper and lower extremities. Normal rapid alternating movements.  Romberg test is negative.  INITIAL IMPRESSION AND RECOMMENDATIONS:  Dandrea Widdowson is a 65 year old disabled man with significant history of heart disease with bilateral lower extremity pain and numbness consistent with neurogenic claudication with prior lumbar fusion at what appears to be the L4-5 level with marked spondylolisthesis of L3 and L4.  The patient was seen by Dr. Myrtis Ser who cleared him for surgical intervention.  He underwent an MRI of his lumbar spine and returned to see me in the office on December 17, 2000 to review that.  The MRI demonstrates that he has bilateral pars defects at L3-4 with anterolisthesis of L3 on L4 of 1.2 cm.  The patient has a wide posterior decompression of the central canal and it does not appear to be narrow but there is marked narrowing of both lateral recesses in both neuroforamina and it was felt that the L3 nerve roots were compressed as they exit the neuroforamina bilaterally. This is entirely consistent with his complaints.  There are chronic changes of discogenic sclerosis affecting the L3 and L4 vertebral bodies.  Mr. Acy indicates that he is having a great deal of pain and wants to get some relief of that.  I have recommended therefore that he undergo decompression and fusion at the L3-4 level.  The previously fused L4 through sacral levels appear to be stable. There is no change on flexion and extension views.  There is no dynamic instability on flexion and extension at the L3-4 level.  I discussed with Mr. Holmer that he needed to undergo cardiac clearance which was performed and I recommended that he undergo decompression with interbody TLIF fusion at the L3-4 level with pedicular fixation at L3-4 level.  He wishes to proceed with that as soon as possible.  He was made aware that I am moving in late June and  wanted me to go ahead with this surgery and perform it as soon as possible with that in mind.  He is also in considerable discomfort and wants to get some relief.  I went over the surgical procedure with the patient in detail today. I went over diagnostic studies as well as surgical models.  We discussed the potential risks and benefits of surgery as well as the expected hospital course.  The  patient is to wear a brace for approximately three months after surgery.  The risks include but are not limited to bleeding, possible need for transfusion, infection, damage to nerves and vessels, risks of anesthesia, dural tear, injury to lumbar nerve roots causing either temporary or permanent leg pain, numbness and/or weakness, malpositioning of instrumentation, fusion failure, failure to relief back pain after surgery, recurrent disc herniation, worsening of pain and adjacent degeneration after spinal fusion. Also the potential need for further surgery in the event of an incomplete fusion.  We also discussed the risks to abdominal structures including bowel, iliac artery or vein injury.  Additionally the risk of exacerbating underlying heart disease and perioperative myocardial infarction and death.  He understands these risks and wishes to proceed with surgery. This is set up for Jan 07, 2001. DD:  01/07/01 TD:  01/07/01 Job: [id number]

## 2011-01-28 NOTE — Consult Note (Signed)
[name], [name]               ACCOUNT NO.:  [id number]  MEDICAL RECORD NO.:  [id number]           PATIENT TYPE:  I  LOCATION:  6525                         FACILITY:  [facility]  PHYSICIAN:  Reather Littler, M.D.       DATE OF BIRTH:  1946-02-06  DATE OF CONSULTATION: DATE OF DISCHARGE:                                CONSULTATION   REASON FOR CONSULTATION:  Low cortisol.  HISTORY:  This is a 65 year old patient with known coronary artery disease, who presented to the emergency room being brought in for progressive weakness and chest pain.  On questioning, the patient apparently has been feeling unwell for several months, but mostly in the last 2 months, he has been feeling somewhat weaker and has not been well to do much physical activity.  He says that he has weakness and lightheadedness when he tries to get up and if he is up for any length of time, he will get weak and sweaty.  This has been progressively worsening in the last week or so.  It has been very prominent.  On admission, he was feeling extremely weak and apparently his blood pressure had been relatively low and on the history is recorded as 94/52.  Previously, the patient had been seen in the clinic where his Lasix was reduced to 20 mg because of lower blood pressure readings.  The patient also complains of feeling nausea for the last 2 months or so.  He has, however, able to eat and has not lost any weight, does not complain of any diarrhea or vomiting or abdominal pain.  The patient does complain of feeling fatigued and this has been present again for the last few months, does not complain of any cold intolerance, in fact, he feels relatively hot, does not have any excessive dry skin or constipation.  Review of the other endocrine problems indicate that he has not been sexually active for 16 or 18 years, but he thinks that he does have some desire, but unable to be active because of his wife's  condition.  MEDICATIONS ON ADMISSION:  Prilosec, gabapentin, fenofibrate, Effexor, Crestor, Coreg, lisinopril, Lasix, OxyContin, Endocet, aspirin, and Viibryd.  ALLERGIES:  PENICILLIN.  PAST MEDICAL HISTORY: 1. He has had coronary artery disease with lesions in his RCA and     proximal intermediate lesions. 2. He has also had cardiomyopathy. 3. Defibrillator implantation. 4. Depression. 5. History of alcohol abuse. 6. He had a Bell palsy on the left side in 1998. 7. Some sinus surgery.  PAST SURGICAL HISTORY: 1. He has had multiple back surgeries. 2. He has had umbilical hernia repair.  PERSONAL HISTORY:  He is married.  He has quit smoking in 1998.  He is drinking less heavily now.  FAMILY HISTORY:  No history of CAD or diabetes.  REVIEW OF SYSTEMS:  He has had hypertension, dyslipidemia, depression, obesity.  He has known sleep apnea, but does not always use his CPAP. He will tend to feel sleepy during the day.  He has also known reflux symptoms as well as some shortness of breath on exertion.  He does take  chronic pain medications.  The patient also has frequent urination at night every couple of hours, although not excessively in the daytime, does tend to have dry mouth and increased thirst, but can restrict water if needed.  He has no diabetes or known thyroid disease.  He does have some multiple joint pains.  He says that he has been gaining weight over the last few months.  He has no recent pedal edema.  PHYSICAL EXAMINATION:  VITAL SIGNS:  His blood pressure is 100/70, pulse is 80, regular. GENERAL:  He looks slightly pale. HEENT:  Eyes are not swollen.  His face and extremities are not puffy. He does have a left eyelid droop.  Fundi not examined.  His oral mucosa is dry. NECK:  There is no lymphadenopathy.  There is no thyroid enlargement. No carotid bruits. HEART:  Heart sounds are normal with no murmurs heard. LUNGS:  Clear. ABDOMEN:  This is obese.  There  is no mass or tenderness. EXTREMITIES:  No skin lesions.  He has some osteoarthritic changes in his fingers.  Deep tendon reflexes appear normal.  ASSESSMENT:  The patient had a low cortisol level of 1.5 during the night after admission and this indicates possible adrenal insufficiency. A cortisol stimulation test has been ordered and the results will be reviewed when available.  Interestingly, he has no hyponatremia at this time which is usually present with Addison disease.  PLAN:  Review ACTH stimulation results and further management will be decided subsequently.  Thank you for the consultation.     Reather Littler, M.D.     AK/MEDQ  D:  12/18/2010  T:  12/18/2010  Job:  [id number]  cc:   Vesta Mixer, M.D.  Electronically Signed by Reather Littler M.D. on 01/28/2011 10:36:55 AM

## 2011-07-14 ENCOUNTER — Ambulatory Visit (INDEPENDENT_AMBULATORY_CARE_PROVIDER_SITE_OTHER): Payer: No Typology Code available for payment source | Admitting: *Deleted

## 2011-07-14 DIAGNOSIS — I428 Other cardiomyopathies: Secondary | ICD-10-CM

## 2011-07-14 LAB — ICD DEVICE OBSERVATION
AL AMPLITUDE: 4 mv
AL THRESHOLD: 0.75 V
BAMS-0001: 150 {beats}/min
DEV-0020ICD: NEGATIVE
DEVICE MODEL ICD: 593609
FVT: 0
HV IMPEDENCE: 55 Ohm
MODE SWITCH EPISODES: 16
PACEART VT: 0
RV LEAD AMPLITUDE: 11.7 mv
RV LEAD THRESHOLD: 1.25 V
TOT-0007: 1
TOT-0010: 7
TZON-0004SLOWVT: 24
TZON-0010SLOWVT: 80 ms
VENTRICULAR PACING ICD: 1.7 pct
VF: 0

## 2011-07-14 NOTE — Progress Notes (Signed)
icd check in clinic  

## 2011-07-17 ENCOUNTER — Encounter: Payer: Self-pay | Admitting: Cardiology

## 2011-07-22 ENCOUNTER — Other Ambulatory Visit: Payer: Self-pay | Admitting: Internal Medicine

## 2011-07-22 MED ORDER — METOPROLOL SUCCINATE ER 25 MG PO TB24: 25.0000 mg | ORAL_TABLET | Freq: Every day | ORAL | Status: DC

## 2011-07-30 ENCOUNTER — Encounter: Payer: Self-pay | Admitting: Cardiology

## 2011-07-31 ENCOUNTER — Ambulatory Visit: Payer: No Typology Code available for payment source | Admitting: Cardiology

## 2011-08-19 ENCOUNTER — Encounter: Payer: Self-pay | Admitting: Internal Medicine

## 2011-09-01 HISTORY — PX: TRANSURETHRAL RESECTION OF PROSTATE: SHX73

## 2011-09-11 ENCOUNTER — Ambulatory Visit: Payer: No Typology Code available for payment source | Admitting: Cardiology

## 2011-09-16 ENCOUNTER — Encounter (HOSPITAL_COMMUNITY): Payer: Self-pay | Admitting: *Deleted

## 2011-09-16 ENCOUNTER — Emergency Department (HOSPITAL_COMMUNITY): Payer: No Typology Code available for payment source

## 2011-09-16 ENCOUNTER — Emergency Department (HOSPITAL_COMMUNITY)
Admission: EM | Admit: 2011-09-16 | Discharge: 2011-09-16 | Disposition: A | Discharge status: Home / Self Care | Payer: No Typology Code available for payment source | Attending: Emergency Medicine | Admitting: Emergency Medicine

## 2011-09-16 DIAGNOSIS — I2510 Atherosclerotic heart disease of native coronary artery without angina pectoris: Secondary | ICD-10-CM | POA: Insufficient documentation

## 2011-09-16 DIAGNOSIS — M7989 Other specified soft tissue disorders: Secondary | ICD-10-CM | POA: Insufficient documentation

## 2011-09-16 DIAGNOSIS — K219 Gastro-esophageal reflux disease without esophagitis: Secondary | ICD-10-CM | POA: Insufficient documentation

## 2011-09-16 DIAGNOSIS — W010XXA Fall on same level from slipping, tripping and stumbling without subsequent striking against object, initial encounter: Secondary | ICD-10-CM | POA: Insufficient documentation

## 2011-09-16 DIAGNOSIS — J449 Chronic obstructive pulmonary disease, unspecified: Secondary | ICD-10-CM | POA: Insufficient documentation

## 2011-09-16 DIAGNOSIS — M79609 Pain in unspecified limb: Secondary | ICD-10-CM | POA: Insufficient documentation

## 2011-09-16 DIAGNOSIS — J4489 Other specified chronic obstructive pulmonary disease: Secondary | ICD-10-CM | POA: Insufficient documentation

## 2011-09-16 DIAGNOSIS — F3289 Other specified depressive episodes: Secondary | ICD-10-CM | POA: Insufficient documentation

## 2011-09-16 DIAGNOSIS — S92309A Fracture of unspecified metatarsal bone(s), unspecified foot, initial encounter for closed fracture: Secondary | ICD-10-CM

## 2011-09-16 DIAGNOSIS — R269 Unspecified abnormalities of gait and mobility: Secondary | ICD-10-CM | POA: Insufficient documentation

## 2011-09-16 DIAGNOSIS — E782 Mixed hyperlipidemia: Secondary | ICD-10-CM | POA: Insufficient documentation

## 2011-09-16 DIAGNOSIS — F329 Major depressive disorder, single episode, unspecified: Secondary | ICD-10-CM | POA: Insufficient documentation

## 2011-09-16 DIAGNOSIS — M1990 Unspecified osteoarthritis, unspecified site: Secondary | ICD-10-CM | POA: Insufficient documentation

## 2011-09-16 DIAGNOSIS — I10 Essential (primary) hypertension: Secondary | ICD-10-CM | POA: Insufficient documentation

## 2011-09-16 DIAGNOSIS — Z7982 Long term (current) use of aspirin: Secondary | ICD-10-CM | POA: Insufficient documentation

## 2011-09-16 DIAGNOSIS — Z79899 Other long term (current) drug therapy: Secondary | ICD-10-CM | POA: Insufficient documentation

## 2011-09-16 MED ORDER — OXYCODONE HCL 5 MG PO TABS: ORAL_TABLET | ORAL | Status: DC

## 2011-09-16 NOTE — ED Notes (Signed)
Pain rt foot for 6 weeks.  Says he has a fx

## 2011-09-16 NOTE — ED Provider Notes (Signed)
History     CSN: [id number]  Arrival date & time 09/16/11  1506   First MD Initiated Contact with Patient 09/16/11 1610      Chief Complaint  Patient presents with  . Foot Pain    (Consider location/radiation/quality/duration/timing/severity/associated sxs/prior treatment) Patient is a 66 y.o. male presenting with lower extremity pain. The history is provided by the patient.  Foot Pain This is a chronic problem. Episode onset: He describes stumbling about 6 weeks ago,  whe he tried to stand up while his foot "was asleep".  He felt a pop with a persistent pain since this event. The problem occurs constantly. The problem has been unchanged. Associated symptoms include arthralgias. Pertinent negatives include no abdominal pain, chest pain, congestion, fever, headaches, joint swelling, nausea, neck pain, numbness, rash, sore throat or weakness. Associated symptoms comments: He saw his rheumatologist 3 weeks ago,  Who xrayed the foot and confirmed a fracture.  He was then referred to an orthopedist in [place] whom he was unable to see due to an insurance issue.  He presents today because he is tired of the constant pain.  He denies new injury.  He wears a "tight shoe" which he thought would be enough to heal the bone correctly.  He has never worn a cast for this injury.. The symptoms are aggravated by walking and standing. Treatments tried: He his on chronic pain medication for severe arthritis which does not relieve his foot pain.    Past Medical History  Diagnosis Date  . Essential hypertension, benign   . Mixed hyperlipidemia   . Cardiomyopathy     Mixed, LVEF 35% up to 50%  . Coronary atherosclerosis of native coronary artery     Occluded RCA with good collaterals  . H/O alcohol abuse   . Depression   . Obesity   . Sleep apnea     Noncompliance with CPAP therapy  . COPD (chronic obstructive pulmonary disease)   . Osteoarthritis   . DDD (degenerative disc disease)   . GERD  (gastroesophageal reflux disease)   . ACTH deficiency     Dr. Lucianne Muss    Past Surgical History  Procedure Date  . Back surgery     Multiple back surgeries for chronic back pain and lumbar fusion  . Umbilical hernia repair     Family History  Problem Relation Age of Onset  . Coronary artery disease      History  Substance Use Topics  . Smoking status: Former Smoker -- 1.0 packs/day for 36 years    Types: Cigarettes    Quit date: 05/26/1997  . Smokeless tobacco: Never Used  . Alcohol Use: Yes     History of abuse. Still admits to occasional binges      Review of Systems  Constitutional: Negative for fever.  HENT: Negative for congestion, sore throat and neck pain.   Eyes: Negative.   Respiratory: Negative for chest tightness and shortness of breath.   Cardiovascular: Negative for chest pain.  Gastrointestinal: Negative for nausea and abdominal pain.  Genitourinary: Negative.   Musculoskeletal: Positive for arthralgias and gait problem. Negative for joint swelling.  Skin: Negative.  Negative for rash and wound.  Neurological: Negative for dizziness, weakness, light-headedness, numbness and headaches.  Hematological: Negative.   Psychiatric/Behavioral: Negative.     Allergies  Penicillins  Home Medications   Current Outpatient Rx  Name Route Sig Dispense Refill  . ASPIRIN EC 81 MG PO TBEC Oral Take 1 tablet (81 mg total)  by mouth daily.    . CHOLECALCIFEROL 400 UNITS PO TABS Oral Take 5,000 Units by mouth daily.      Marland Kitchen DIAZEPAM 10 MG PO TABS Oral Take 10 mg by mouth every 6 (six) hours as needed.      . FENOFIBRATE 160 MG PO TABS Oral Take 160 mg by mouth daily.      . OMEGA-3 FATTY ACIDS 1000 MG PO CAPS Oral Take 2 g by mouth daily.      Marland Kitchen FOLIC ACID 1 MG PO TABS Oral Take 1 mg by mouth daily.      . FUROSEMIDE 40 MG PO TABS Oral Take 40 mg by mouth as needed.      Marland Kitchen GABAPENTIN 300 MG PO CAPS Oral Take 600 mg by mouth 3 (three) times daily.     Marland Kitchen HYDROCORTISONE  20 MG PO TABS Oral Take 20 mg by mouth every morning. And 10mg  and night     . METOPROLOL SUCCINATE ER 25 MG PO TB24 Oral Take 1 tablet (25 mg total) by mouth daily. 30 tablet 6  . OCUVITE-LUTEIN PO CAPS Oral Take 1 capsule by mouth daily.      Marland Kitchen OMEPRAZOLE 20 MG PO CPDR Oral Take 20 mg by mouth daily.      . OXYCODONE HCL ER 80 MG PO TB12 Oral Take 80 mg by mouth every 12 (twelve) hours.      Marland Kitchen ROSUVASTATIN CALCIUM 20 MG PO TABS Oral Take 20 mg by mouth daily.      . SENNOSIDES 8.6 MG PO TABS Oral Take 1 tablet by mouth daily.        BP 128/81  Pulse 88  Temp(Src) 97.7 F (36.5 C) (Oral)  Resp 20  Ht 5\' 10"  (1.778 m)  Wt 228 lb (103.42 kg)  BMI 32.71 kg/m2  SpO2 99%  Physical Exam  Nursing note and vitals reviewed. Constitutional: He is oriented to person, place, and time. He appears well-developed and well-nourished.  HENT:  Head: Normocephalic.  Eyes: Conjunctivae are normal.  Neck: Normal range of motion.  Cardiovascular: Normal rate and intact distal pulses.  Exam reveals no decreased pulses.   Pulses:      Dorsalis pedis pulses are 2+ on the right side, and 2+ on the left side.       Posterior tibial pulses are 2+ on the right side, and 2+ on the left side.  Pulmonary/Chest: Effort normal.  Musculoskeletal: He exhibits edema and tenderness.       Right foot: He exhibits tenderness, bony tenderness and swelling. He exhibits normal capillary refill and no crepitus.       Feet:  Neurological: He is alert and oriented to person, place, and time. No sensory deficit.  Skin: Skin is warm, dry and intact.    ED Course  Procedures (including critical care time)  Labs Reviewed - No data to display Dg Foot Complete Right  09/16/2011  *RADIOLOGY REPORT*  Clinical Data: Right foot pain  RIGHT FOOT COMPLETE - 3+ VIEW  Comparison: None.  Findings: Three-view exam shows nonacute fracture of the fifth metatarsal.  There is some bony overriding and bridging callus.  No other acute  fracture is evident.  Benign-appearing lucency is identified in the navicular.  IMPRESSION: Nonacute fifth metatarsal fracture with bony overriding and bridging callus.  Original Report Authenticated By: ERIC A. MANSELL, M.D.     1. Metatarsal bone fracture       MDM  Cam walker,  Crutches.  Referral to Dr. Romeo Apple.        Candis Musa, PA 09/17/11 1238  Candis Musa, PA 09/17/11 1239

## 2011-09-16 NOTE — ED Notes (Signed)
Pt reports fractured  r foot in Dec and then stepped on something in his yard in Jan and fractured 1st 3 toes.

## 2011-09-16 NOTE — ED Notes (Signed)
PT's R foot and ankle swollen, pedal pulse obtained with doppler.  Foot warm to touch.

## 2011-09-17 NOTE — ED Provider Notes (Signed)
Medical screening examination/treatment/procedure(s) were performed by non-physician practitioner and as supervising physician I was immediately available for consultation/collaboration.   Glynn Octave, MD 09/17/11 [phone]

## 2011-09-22 ENCOUNTER — Ambulatory Visit (INDEPENDENT_AMBULATORY_CARE_PROVIDER_SITE_OTHER): Payer: No Typology Code available for payment source | Admitting: Orthopedic Surgery

## 2011-09-22 ENCOUNTER — Encounter: Payer: Self-pay | Admitting: Orthopedic Surgery

## 2011-09-22 VITALS — BP 124/80 | Ht 70.0 in | Wt 233.0 lb

## 2011-09-22 DIAGNOSIS — S92309A Fracture of unspecified metatarsal bone(s), unspecified foot, initial encounter for closed fracture: Secondary | ICD-10-CM

## 2011-09-22 NOTE — Patient Instructions (Signed)
Activity as tolerated

## 2011-09-22 NOTE — Progress Notes (Signed)
Patient ID: [name], male   DOB: 06-28-1946, 66 y.o.   MRN: [id number] Chief complaint: pain right foot  HPI:(4) 66 yo male with multiple medical problems presents with old (6 weeks) right 5th A metatarsal fracture. He also complains of pain in his RIGHT great toe and 2nd toe and 3rd toe. This bothers him more than the 5th metatarsal fracture. He did not tolerate the CAM walker. The small digits and great toe are painful at the joint of the metatarsophalangeal area with intermittent symptoms relieved better with a sketchers fit walking sneaker. There is some swelling and bruising.  ROS:(2) Review of Systems  Constitutional: Positive for fever and malaise/fatigue. Negative for weight loss.  Eyes: Positive for blurred vision.  Respiratory: Positive for shortness of breath.   Gastrointestinal: Positive for constipation.  All other systems reviewed and are negative.     PFSH: (1)  Past Medical History  Diagnosis Date  . Essential hypertension, benign   . Mixed hyperlipidemia   . Cardiomyopathy     Mixed, LVEF 35% up to 50%  . Coronary atherosclerosis of native coronary artery     Occluded RCA with good collaterals  . H/O alcohol abuse   . Depression   . Obesity   . Sleep apnea     Noncompliance with CPAP therapy  . COPD (chronic obstructive pulmonary disease)   . Osteoarthritis   . DDD (degenerative disc disease)   . GERD (gastroesophageal reflux disease)   . ACTH deficiency     Dr. Lucianne Muss     Physical Exam(12) GENERAL: normal development , Mild obesity.  CDV: pulses are normal   Skin: normal  Lymph: nodes were not palpable/normal  Psychiatric: awake, alert and oriented  Neuro: normal sensation  MSK Ambulation with a small limp, favoring the RIGHT lower extremity 1 RIGHT foot: The 5th metatarsal fracture is nontender and the alignment of the small toe is normal. He does have tenderness at the metatarsophalangeal joint of the great toe. 2nd, 3rd digits, nontender.  Lisfranc joint, nontender. No motion abnormality, and no instability or pain with range of motion of Lisfranc joint 2 Deformity of the LEFT great toe nail. 3 Mallet deformity, 2nd digit DIP joint.  Imaging: Hospital imaging and report indicates old overlapping 5th metatarsal fracture  Assessment: Old 5th metatarsal fracture, sprain, RIGHT toe    Plan: Self-limiting continue current management

## 2011-12-03 ENCOUNTER — Ambulatory Visit (INDEPENDENT_AMBULATORY_CARE_PROVIDER_SITE_OTHER): Payer: Medicare Other | Admitting: Cardiology

## 2011-12-03 ENCOUNTER — Encounter: Payer: Self-pay | Admitting: Cardiology

## 2011-12-03 VITALS — BP 117/74 | HR 76 | Ht 69.0 in | Wt 239.0 lb

## 2011-12-03 DIAGNOSIS — I429 Cardiomyopathy, unspecified: Secondary | ICD-10-CM

## 2011-12-03 DIAGNOSIS — E782 Mixed hyperlipidemia: Secondary | ICD-10-CM

## 2011-12-03 DIAGNOSIS — I2510 Atherosclerotic heart disease of native coronary artery without angina pectoris: Secondary | ICD-10-CM

## 2011-12-03 DIAGNOSIS — Z95810 Presence of automatic (implantable) cardiac defibrillator: Secondary | ICD-10-CM

## 2011-12-03 DIAGNOSIS — R002 Palpitations: Secondary | ICD-10-CM

## 2011-12-03 MED ORDER — METOPROLOL SUCCINATE ER 25 MG PO TB24: 25.0000 mg | ORAL_TABLET | Freq: Two times a day (BID) | ORAL | Status: DC

## 2011-12-03 NOTE — Assessment & Plan Note (Signed)
Frequent PVCs noted. Beta blocker being advanced.

## 2011-12-03 NOTE — Assessment & Plan Note (Signed)
Continue on statin therapy, followup with Dr. Charm Barges. Goal LDL should be under 100.

## 2011-12-03 NOTE — Assessment & Plan Note (Signed)
Improvement in LVEF up to 50% by most recent evaluation. Continue medical therapy. Not on ACE inhibitor now with prior hypotension in the setting of adrenal disease. We might be able to add this eventually.

## 2011-12-03 NOTE — Progress Notes (Signed)
Clinical Summary Mr. Pinney is a 66 y.o.male presenting for followup. He was last seen in May 2012. He reports occasional chest pain, chronic dyspnea on exertion. His weight is up 6 pounds from the last visit. He is on chronic steroids as noted.  Followup ECG shows ventricular bigeminy. He denies any device discharges and is to have an interrogation done later this month. He has followed with Dr. Johney Frame.  We reviewed his cardiac medications. He states that he has exactly been taking a small dose of Coreg in the evening because he was having nightmares, felt that this improved his symptoms.  Reports compliance with statin therapy, followup with Dr. Charm Barges.   Allergies  Allergen Reactions  . Penicillins     REACTION: hives    Current Outpatient Prescriptions  Medication Sig Dispense Refill  . allopurinol (ZYLOPRIM) 300 MG tablet Take 300 mg by mouth daily.      Marland Kitchen aspirin EC 81 MG tablet Take 1 tablet (81 mg total) by mouth daily.      . cholecalciferol (VITAMIN D) 400 UNITS TABS Take 5,000 Units by mouth daily.        . diazepam (VALIUM) 10 MG tablet Take 10 mg by mouth every 6 (six) hours as needed.        . fenofibrate 160 MG tablet Take 160 mg by mouth daily.        . fish oil-omega-3 fatty acids 1000 MG capsule Take 2 g by mouth daily.        . folic acid (FOLVITE) 1 MG tablet Take 1 mg by mouth daily.        . furosemide (LASIX) 40 MG tablet Take 40 mg by mouth as needed.        . gabapentin (NEURONTIN) 300 MG capsule Take 600 mg by mouth 3 (three) times daily.       . hydrocortisone (CORTEF) 20 MG tablet Take 20 mg by mouth every morning. And 10mg  and night       . metoprolol succinate (TOPROL XL) 25 MG 24 hr tablet Take 1 tablet (25 mg total) by mouth 2 (two) times daily.  60 tablet  6  . multivitamin-lutein (OCUVITE-LUTEIN) CAPS Take 1 capsule by mouth daily.        Marland Kitchen omeprazole (PRILOSEC) 20 MG capsule Take 20 mg by mouth daily.        Marland Kitchen oxyCODONE (OXY IR/ROXICODONE) 5 MG  immediate release tablet Take 15 mg by mouth every 6 (six) hours as needed.      Marland Kitchen oxyCODONE (OXYCONTIN) 80 MG 12 hr tablet Take 80 mg by mouth every 12 (twelve) hours.        . predniSONE (DELTASONE) 10 MG tablet Take 10 mg by mouth daily.      . rosuvastatin (CRESTOR) 20 MG tablet Take 20 mg by mouth daily.        Marland Kitchen senna (SENOKOT) 8.6 MG tablet Take 1 tablet by mouth daily.        Marland Kitchen DISCONTD: metoprolol succinate (TOPROL XL) 25 MG 24 hr tablet Take 1 tablet (25 mg total) by mouth daily.  30 tablet  6    Past Medical History  Diagnosis Date  . Essential hypertension, benign   . Mixed hyperlipidemia   . Cardiomyopathy     Mixed, LVEF 35% up to 50%  . Coronary atherosclerosis of native coronary artery     Occluded RCA with good collaterals  . H/O alcohol abuse   . Depression   .  Obesity   . Sleep apnea     Noncompliance with CPAP therapy  . COPD (chronic obstructive pulmonary disease)   . Osteoarthritis   . DDD (degenerative disc disease)   . GERD (gastroesophageal reflux disease)   . ACTH deficiency     Dr. Lucianne Muss    Past Surgical History  Procedure Date  . Back surgery     Multiple back surgeries for chronic back pain and lumbar fusion  . Umbilical hernia repair   . Knee surgery   . Ankle surgery right  . Femur surgery right  . Nasal sinus surgery   . Icd implantation 2010    [employer] - Dr. Johney Frame    Family History  Problem Relation Age of Onset  . Coronary artery disease    . Arthritis      Social History Mr. Rodarte reports that he quit smoking about 14 years ago. His smoking use included Cigarettes. He has a 36 pack-year smoking history. He has never used smokeless tobacco. Mr. Radigan reports that he drinks alcohol.  Review of Systems Feels palpitations, no device discharges. No orthopnea. No bleeding problems. No syncope. Otherwise negative.  Physical Examination Filed Vitals:   12/03/11 1023  BP: 117/74  Pulse: 76   Morbidly obese male no acute  distress. HEENT: Conjunctiva and lids normal, oropharynx clear. Neck: Supple, no elevated JVP, no thyromegaly. Lungs: Decreased but clear to auscultation, nonlabored breathing at rest. Cardiac: Regular rate and rhythm with frequent ectopy, no S3, no pericardial rub. Abdomen: Obese, nontender, bowel sounds present, no guarding or rebound. Extremities: Mild chronic appearing edema, distal pulses 1+. Skin: Warm and dry. Musculoskeletal: No kyphosis. Neuropsychiatric: Alert and oriented x3, affect grossly appropriate.   ECG Atrial paced rhythm with ventricular bigeminy.    Problem List and Plan

## 2011-12-03 NOTE — Assessment & Plan Note (Signed)
Plan to continue medical therapy and observation for now. Increase Toprol-XL to 25 mg twice daily. Followup arranged.

## 2011-12-03 NOTE — Assessment & Plan Note (Signed)
Keep followup with Dr. Johney Frame for device interrogation.

## 2011-12-03 NOTE — Patient Instructions (Signed)
Your physician wants you to follow-up in: 3 months. You will receive a reminder letter in the mail one-two months in advance. If you don't receive a letter, please call our office to schedule the follow-up appointment. Increase Toprol XL (metoprolol succ) to 25 mg two times a day. A new prescription was sent to your pharmacy to reflect this change.

## 2011-12-24 ENCOUNTER — Encounter: Payer: Self-pay | Admitting: Internal Medicine

## 2011-12-24 ENCOUNTER — Ambulatory Visit (INDEPENDENT_AMBULATORY_CARE_PROVIDER_SITE_OTHER): Payer: Medicare Other | Admitting: Internal Medicine

## 2011-12-24 VITALS — BP 111/77 | HR 76 | Ht 69.0 in | Wt 238.0 lb

## 2011-12-24 DIAGNOSIS — I4891 Unspecified atrial fibrillation: Secondary | ICD-10-CM

## 2011-12-24 DIAGNOSIS — I429 Cardiomyopathy, unspecified: Secondary | ICD-10-CM

## 2011-12-24 DIAGNOSIS — I495 Sick sinus syndrome: Secondary | ICD-10-CM

## 2011-12-24 DIAGNOSIS — I5022 Chronic systolic (congestive) heart failure: Secondary | ICD-10-CM

## 2011-12-24 LAB — ICD DEVICE OBSERVATION
AL IMPEDENCE ICD: 350 Ohm
ATRIAL PACING ICD: 22 pct
BAMS-0003: 70 {beats}/min
HV IMPEDENCE: 53 Ohm
RV LEAD IMPEDENCE ICD: 475 Ohm
TOT-0006: 20101019000000
TOT-0008: 0
TOT-0009: 1
TOT-0010: 8
TZON-0003SLOWVT: 355 ms
TZON-0004SLOWVT: 24
TZON-0005SLOWVT: 6

## 2011-12-24 NOTE — Assessment & Plan Note (Signed)
Stable Normal ICD function See Pace Art report No changes today    

## 2011-12-24 NOTE — Progress Notes (Signed)
PCP:  Alcide Evener, DO Primary Cardiologist: Dr Diona Browner  The patient presents today for routine electrophysiology followup.  Since last being seen in our clinic, the patient reports doing very well. His primary concern is with chronic back pain. Today, he denies symptoms of palpitations, chest pain, shortness of breath, orthopnea, PND, dizziness, presyncope, syncope, or ICD shocks.  He has stable mild edema. The patient feels that he is tolerating medications without difficulties and is otherwise without complaint today.   Past Medical History  Diagnosis Date  . Essential hypertension, benign   . Mixed hyperlipidemia   . Cardiomyopathy     Mixed, LVEF 35% up to 50%  . Coronary atherosclerosis of native coronary artery     Occluded RCA with good collaterals  . H/O alcohol abuse   . Depression   . Obesity   . Sleep apnea     Noncompliance with CPAP therapy  . COPD (chronic obstructive pulmonary disease)   . Osteoarthritis   . DDD (degenerative disc disease)   . GERD (gastroesophageal reflux disease)   . ACTH deficiency     Dr. Lucianne Muss   Past Surgical History  Procedure Date  . Back surgery     Multiple back surgeries for chronic back pain and lumbar fusion  . Umbilical hernia repair   . Knee surgery   . Ankle surgery right  . Femur surgery right  . Nasal sinus surgery   . Icd implantation 2010    [employer] - Dr. Johney Frame    Current Outpatient Prescriptions  Medication Sig Dispense Refill  . allopurinol (ZYLOPRIM) 300 MG tablet Take 300 mg by mouth daily.      Marland Kitchen aspirin EC 81 MG tablet Take 1 tablet (81 mg total) by mouth daily.      . cholecalciferol (VITAMIN D) 400 UNITS TABS Take 5,000 Units by mouth daily.        . diazepam (VALIUM) 10 MG tablet Take 10 mg by mouth every 6 (six) hours as needed.        . fenofibrate 160 MG tablet Take 160 mg by mouth daily.        . furosemide (LASIX) 80 MG tablet Take 80 mg by mouth daily.      Marland Kitchen gabapentin (NEURONTIN) 300 MG capsule  Take 600 mg by mouth 3 (three) times daily.       . metoprolol succinate (TOPROL XL) 25 MG 24 hr tablet Take 1 tablet (25 mg total) by mouth 2 (two) times daily.  60 tablet  6  . multivitamin-lutein (OCUVITE-LUTEIN) CAPS Take 1 capsule by mouth daily.        Marland Kitchen oxyCODONE (OXY IR/ROXICODONE) 5 MG immediate release tablet Take 15 mg by mouth every 6 (six) hours as needed.      Marland Kitchen oxyCODONE (OXYCONTIN) 80 MG 12 hr tablet Take 80 mg by mouth every 12 (twelve) hours.        . rosuvastatin (CRESTOR) 20 MG tablet Take 20 mg by mouth daily.        Marland Kitchen senna (SENOKOT) 8.6 MG tablet Take 1 tablet by mouth daily.        . Tamsulosin HCl (FLOMAX) 0.4 MG CAPS Take 0.4 mg by mouth daily.        Allergies  Allergen Reactions  . Penicillins     REACTION: hives    History   Social History  . Marital Status: Divorced    Spouse Name: N/A    Number of Children: N/A  .  Years of Education: N/A   Occupational History  . Not on file.   Social History Main Topics  . Smoking status: Former Smoker -- 1.0 packs/day for 36 years    Types: Cigarettes    Quit date: 05/26/1997  . Smokeless tobacco: Never Used  . Alcohol Use: Yes     History of abuse. Still admits to occasional binges  . Drug Use: No  . Sexually Active: Not on file   Other Topics Concern  . Not on file   Social History Narrative  . No narrative on file    Family History  Problem Relation Age of Onset  . Coronary artery disease    . Arthritis      Physical Exam: Filed Vitals:   12/24/11 0900  BP: 111/77  Pulse: 76  Height: 5\' 9"  (1.753 m)  Weight: 238 lb (107.956 kg)    GEN- The patient is well appearing, alert and oriented x 3 today.   Head- normocephalic, atraumatic Eyes-  Sclera clear, conjunctiva pink Ears- hearing intact Oropharynx- clear Neck- supple, no JVP Lymph- no cervical lymphadenopathy Lungs- Clear to ausculation bilaterally, normal work of breathing Chest- ICD pocket is well healed Heart- Regular rate and  rhythm, no murmurs, rubs or gallops, PMI not laterally displaced GI- soft, NT, ND, + BS Extremities- no clubbing, cyanosis, or edema  ICD interrogation- reviewed in detail today,  See PACEART report  Assessment and Plan:

## 2011-12-24 NOTE — Assessment & Plan Note (Signed)
Several short episodes of afib detected by his ICD.  No prolonged episodes.  We will continue to monitor. IF he has increasing afib burden then we will need to consider anticoagulation.

## 2012-01-13 ENCOUNTER — Encounter: Payer: Self-pay | Admitting: Cardiology

## 2012-01-14 DIAGNOSIS — R0602 Shortness of breath: Secondary | ICD-10-CM

## 2012-03-31 ENCOUNTER — Encounter: Payer: Self-pay | Admitting: Internal Medicine

## 2012-03-31 ENCOUNTER — Ambulatory Visit (INDEPENDENT_AMBULATORY_CARE_PROVIDER_SITE_OTHER): Payer: Medicare Other | Admitting: *Deleted

## 2012-03-31 DIAGNOSIS — Z95810 Presence of automatic (implantable) cardiac defibrillator: Secondary | ICD-10-CM

## 2012-03-31 DIAGNOSIS — I429 Cardiomyopathy, unspecified: Secondary | ICD-10-CM

## 2012-03-31 DIAGNOSIS — I5022 Chronic systolic (congestive) heart failure: Secondary | ICD-10-CM

## 2012-04-01 ENCOUNTER — Encounter: Payer: Self-pay | Admitting: *Deleted

## 2012-04-01 LAB — REMOTE ICD DEVICE
AL IMPEDENCE ICD: 400 Ohm
HV IMPEDENCE: 52 Ohm
RV LEAD AMPLITUDE: 11.7 mv
RV LEAD IMPEDENCE ICD: 440 Ohm
TZON-0003SLOWVT: 355 ms
TZON-0004SLOWVT: 24
TZON-0005SLOWVT: 6
TZON-0010SLOWVT: 80 ms

## 2012-04-20 ENCOUNTER — Encounter: Payer: Self-pay | Admitting: *Deleted

## 2012-06-09 ENCOUNTER — Encounter: Payer: Self-pay | Admitting: Cardiology

## 2012-06-09 ENCOUNTER — Ambulatory Visit (INDEPENDENT_AMBULATORY_CARE_PROVIDER_SITE_OTHER): Payer: Medicare Other | Admitting: Cardiology

## 2012-06-09 VITALS — BP 92/66 | HR 72 | Ht 69.0 in | Wt 219.0 lb

## 2012-06-09 DIAGNOSIS — I959 Hypotension, unspecified: Secondary | ICD-10-CM

## 2012-06-09 DIAGNOSIS — E782 Mixed hyperlipidemia: Secondary | ICD-10-CM

## 2012-06-09 DIAGNOSIS — Z95810 Presence of automatic (implantable) cardiac defibrillator: Secondary | ICD-10-CM

## 2012-06-09 DIAGNOSIS — I2510 Atherosclerotic heart disease of native coronary artery without angina pectoris: Secondary | ICD-10-CM

## 2012-06-09 DIAGNOSIS — I429 Cardiomyopathy, unspecified: Secondary | ICD-10-CM

## 2012-06-09 NOTE — Assessment & Plan Note (Signed)
He reports no active angina, but does have shortness of breath with increased levels of activity, likely anginal equivalent. Plan is to continue medical therapy and observation at this point.

## 2012-06-09 NOTE — Assessment & Plan Note (Signed)
Keep followup with Dr. Allred. 

## 2012-06-09 NOTE — Assessment & Plan Note (Signed)
This is been an intermittent problem, limits medical therapy. Toprol-XL has been cut back since his last visit.

## 2012-06-09 NOTE — Assessment & Plan Note (Signed)
He continues on Crestor with followup per Dr. Charm Barges.

## 2012-06-09 NOTE — Progress Notes (Signed)
Clinical Summary Mr. Bahl is a 66 y.o.male presenting for followup. I last saw him in April. He has had interval follow up with Dr. Johney Frame. He reports no angina symptoms, does have NYHA class II dyspnea typically, sometimes class III when he went for a good distance.  He states he sometimes forgets to take his medications. We did discuss diet and sodium restriction. Reports no orthopnea or PND.  Most recent echocardiogram from May of this year revealed improvement in LV function with EF of 55-60%, diastolic dysfunction, RVSP 40-45 mm mercury.  No specific sense of palpitations. ECG reviewed showing sinus rhythm with PACs and nonspecific ST changes.   Allergies  Allergen Reactions  . Penicillins     REACTION: hives    Current Outpatient Prescriptions  Medication Sig Dispense Refill  . allopurinol (ZYLOPRIM) 300 MG tablet Take 300 mg by mouth daily.      Marland Kitchen amitriptyline (ELAVIL) 25 MG tablet Take 1 tablet by mouth Daily.      Marland Kitchen aspirin 325 MG tablet Take 325 mg by mouth daily.      . Cholecalciferol (VITAMIN D3) 5000 UNITS CAPS Take 1 capsule by mouth daily.      . diazepam (VALIUM) 10 MG tablet Take 10 mg by mouth every 12 (twelve) hours as needed.       . fenofibrate 160 MG tablet Take 160 mg by mouth daily.        . fish oil-omega-3 fatty acids 1000 MG capsule Take 1 g by mouth daily.      Marland Kitchen gabapentin (NEURONTIN) 300 MG capsule Take 600 mg by mouth 3 (three) times daily.       . magnesium citrate SOLN Take 1 Bottle by mouth as needed.      . metoprolol succinate (TOPROL-XL) 25 MG 24 hr tablet Take 25 mg by mouth daily.      Maxwell Caul Bicarbonate (ZEGERID) 20-1100 MG CAPS Take 1 capsule by mouth daily before breakfast.      . oxyCODONE (OXY IR/ROXICODONE) 5 MG immediate release tablet Take 15 mg by mouth every 6 (six) hours as needed.      Marland Kitchen oxyCODONE (OXYCONTIN) 80 MG 12 hr tablet Take 80 mg by mouth every 8 (eight) hours.       . rosuvastatin (CRESTOR) 10 MG tablet  Take 10 mg by mouth daily.      Marland Kitchen venlafaxine XR (EFFEXOR-XR) 150 MG 24 hr capsule Take 1 tablet by mouth Daily.      Marland Kitchen VIIBRYD 40 MG TABS Take 1 tablet by mouth Daily.      Marland Kitchen DISCONTD: metoprolol succinate (TOPROL XL) 25 MG 24 hr tablet Take 1 tablet (25 mg total) by mouth 2 (two) times daily.  60 tablet  6    Past Medical History  Diagnosis Date  . Essential hypertension, benign   . Mixed hyperlipidemia   . Cardiomyopathy     Mixed, LVEF 35% up to 50%  . Coronary atherosclerosis of native coronary artery     Occluded RCA with good collaterals  . H/O alcohol abuse   . Depression   . Obesity   . Sleep apnea     Noncompliance with CPAP therapy  . COPD (chronic obstructive pulmonary disease)   . Osteoarthritis   . DDD (degenerative disc disease)   . GERD (gastroesophageal reflux disease)   . ACTH deficiency     Dr. Lucianne Muss    Social History Mr. Hasz reports that he quit smoking about 15  years ago. His smoking use included Cigarettes. He has a 36 pack-year smoking history. He has never used smokeless tobacco. Mr. Danner reports that he drinks alcohol.  Review of Systems Chronic arthritic pain. Stable appetite. Otherwise negative except as outlined.  Physical Examination Filed Vitals:   06/09/12 1436  BP: 92/66  Pulse: 72   Filed Weights   06/09/12 1436  Weight: 219 lb (99.338 kg)   HEENT: Conjunctiva and lids normal, oropharynx clear.  Neck: Supple, no elevated JVP, no thyromegaly.  Lungs: Decreased but clear to auscultation, nonlabored breathing at rest.  Cardiac: Regular rate and rhythm with frequent ectopy, no S3, no pericardial rub.  Abdomen: Obese, nontender, bowel sounds present, no guarding or rebound.  Extremities: Mild chronic appearing edema, distal pulses 1+.  Skin: Warm and dry.  Musculoskeletal: No kyphosis.  Neuropsychiatric: Alert and oriented x3, affect grossly appropriate.    Problem List and Plan   CAD, NATIVE VESSEL He reports no active  angina, but does have shortness of breath with increased levels of activity, likely anginal equivalent. Plan is to continue medical therapy and observation at this point.  CARDIOMYOPATHY, SECONDARY Echocardiogram in May of this year revealed LVEF of 55-60% with diastolic dysfunction.  HYPOTENSION, UNSPECIFIED This is been an intermittent problem, limits medical therapy. Toprol-XL has been cut back since his last visit.  ICD-[employer]Jude Keep followup with Dr. Johney Frame.  MIXED HYPERLIPIDEMIA He continues on Crestor with followup per Dr. Charm Barges.    Jonelle Sidle, M.D., F.A.C.C.

## 2012-06-09 NOTE — Assessment & Plan Note (Signed)
Echocardiogram in May of this year revealed LVEF of 55-60% with diastolic dysfunction.

## 2012-06-09 NOTE — Patient Instructions (Addendum)

## 2012-06-24 ENCOUNTER — Other Ambulatory Visit: Payer: Self-pay | Admitting: Cardiology

## 2012-07-04 ENCOUNTER — Encounter: Payer: Medicare Other | Admitting: *Deleted

## 2012-07-04 ENCOUNTER — Ambulatory Visit (INDEPENDENT_AMBULATORY_CARE_PROVIDER_SITE_OTHER): Payer: Medicare Other | Admitting: *Deleted

## 2012-07-04 ENCOUNTER — Encounter: Payer: Self-pay | Admitting: Internal Medicine

## 2012-07-04 DIAGNOSIS — I429 Cardiomyopathy, unspecified: Secondary | ICD-10-CM

## 2012-07-04 DIAGNOSIS — Z95810 Presence of automatic (implantable) cardiac defibrillator: Secondary | ICD-10-CM

## 2012-07-05 ENCOUNTER — Encounter: Payer: Self-pay | Admitting: *Deleted

## 2012-07-12 LAB — REMOTE ICD DEVICE
AL IMPEDENCE ICD: 380 Ohm
BAMS-0001: 150 {beats}/min
BAMS-0003: 70 {beats}/min
DEV-0020ICD: NEGATIVE
RV LEAD IMPEDENCE ICD: 380 Ohm
TZON-0004SLOWVT: 24
TZON-0005SLOWVT: 6
VENTRICULAR PACING ICD: 1 pct

## 2012-07-26 ENCOUNTER — Encounter: Payer: Self-pay | Admitting: *Deleted

## 2012-10-17 ENCOUNTER — Other Ambulatory Visit: Payer: Self-pay | Admitting: Internal Medicine

## 2012-10-17 ENCOUNTER — Ambulatory Visit (INDEPENDENT_AMBULATORY_CARE_PROVIDER_SITE_OTHER): Payer: Medicare Other | Admitting: *Deleted

## 2012-10-17 ENCOUNTER — Encounter: Payer: Self-pay | Admitting: Internal Medicine

## 2012-10-17 DIAGNOSIS — I429 Cardiomyopathy, unspecified: Secondary | ICD-10-CM

## 2012-10-17 DIAGNOSIS — Z95810 Presence of automatic (implantable) cardiac defibrillator: Secondary | ICD-10-CM

## 2012-10-19 LAB — REMOTE ICD DEVICE
AL IMPEDENCE ICD: 360 Ohm
BAMS-0003: 70 {beats}/min
RV LEAD IMPEDENCE ICD: 440 Ohm
TZON-0003SLOWVT: 355 ms
TZON-0005SLOWVT: 6

## 2012-11-04 ENCOUNTER — Encounter (HOSPITAL_BASED_OUTPATIENT_CLINIC_OR_DEPARTMENT_OTHER): Payer: Self-pay | Admitting: *Deleted

## 2012-11-04 NOTE — Progress Notes (Signed)
Reviewed notes with Dr Roland Rack for here

## 2012-11-04 NOTE — Progress Notes (Signed)
Pt has icd-orders sent to dr allred-last saw dr Diona Browner 10/13-ef now 60% echo 5/13 Pt has sleep apnea-has a cpap-does not use regularly-told to bring all meds, cpap, overnight bag and may need to stay overnight- Needs Istat

## 2012-11-07 ENCOUNTER — Encounter: Payer: Self-pay | Admitting: *Deleted

## 2012-11-09 ENCOUNTER — Encounter (HOSPITAL_BASED_OUTPATIENT_CLINIC_OR_DEPARTMENT_OTHER): Payer: Self-pay | Admitting: *Deleted

## 2012-11-09 ENCOUNTER — Other Ambulatory Visit: Payer: Self-pay | Admitting: Orthopedic Surgery

## 2012-11-10 ENCOUNTER — Encounter (HOSPITAL_BASED_OUTPATIENT_CLINIC_OR_DEPARTMENT_OTHER): Payer: Self-pay | Admitting: Certified Registered Nurse Anesthetist

## 2012-11-10 ENCOUNTER — Ambulatory Visit (HOSPITAL_BASED_OUTPATIENT_CLINIC_OR_DEPARTMENT_OTHER): Payer: Medicare Other | Admitting: Certified Registered Nurse Anesthetist

## 2012-11-10 ENCOUNTER — Ambulatory Visit (HOSPITAL_BASED_OUTPATIENT_CLINIC_OR_DEPARTMENT_OTHER)
Admission: RE | Admit: 2012-11-10 | Discharge: 2012-11-10 | Disposition: A | Discharge status: Home / Self Care | Payer: Medicare Other | Source: Ambulatory Visit | Attending: Orthopedic Surgery | Admitting: Orthopedic Surgery

## 2012-11-10 ENCOUNTER — Encounter (HOSPITAL_BASED_OUTPATIENT_CLINIC_OR_DEPARTMENT_OTHER)
Admission: RE | Disposition: A | Discharge status: Home / Self Care | Payer: Self-pay | Source: Ambulatory Visit | Attending: Orthopedic Surgery

## 2012-11-10 DIAGNOSIS — F329 Major depressive disorder, single episode, unspecified: Secondary | ICD-10-CM | POA: Insufficient documentation

## 2012-11-10 DIAGNOSIS — M2010 Hallux valgus (acquired), unspecified foot: Secondary | ICD-10-CM | POA: Insufficient documentation

## 2012-11-10 DIAGNOSIS — Z7982 Long term (current) use of aspirin: Secondary | ICD-10-CM | POA: Insufficient documentation

## 2012-11-10 DIAGNOSIS — K219 Gastro-esophageal reflux disease without esophagitis: Secondary | ICD-10-CM | POA: Insufficient documentation

## 2012-11-10 DIAGNOSIS — J449 Chronic obstructive pulmonary disease, unspecified: Secondary | ICD-10-CM | POA: Insufficient documentation

## 2012-11-10 DIAGNOSIS — G4730 Sleep apnea, unspecified: Secondary | ICD-10-CM | POA: Insufficient documentation

## 2012-11-10 DIAGNOSIS — M2031 Hallux varus (acquired), right foot: Secondary | ICD-10-CM

## 2012-11-10 DIAGNOSIS — E782 Mixed hyperlipidemia: Secondary | ICD-10-CM | POA: Insufficient documentation

## 2012-11-10 DIAGNOSIS — J4489 Other specified chronic obstructive pulmonary disease: Secondary | ICD-10-CM | POA: Insufficient documentation

## 2012-11-10 DIAGNOSIS — E669 Obesity, unspecified: Secondary | ICD-10-CM | POA: Insufficient documentation

## 2012-11-10 DIAGNOSIS — Z95810 Presence of automatic (implantable) cardiac defibrillator: Secondary | ICD-10-CM | POA: Insufficient documentation

## 2012-11-10 DIAGNOSIS — F3289 Other specified depressive episodes: Secondary | ICD-10-CM | POA: Insufficient documentation

## 2012-11-10 DIAGNOSIS — Z79899 Other long term (current) drug therapy: Secondary | ICD-10-CM | POA: Insufficient documentation

## 2012-11-10 DIAGNOSIS — M1990 Unspecified osteoarthritis, unspecified site: Secondary | ICD-10-CM | POA: Insufficient documentation

## 2012-11-10 DIAGNOSIS — IMO0002 Reserved for concepts with insufficient information to code with codable children: Secondary | ICD-10-CM | POA: Insufficient documentation

## 2012-11-10 DIAGNOSIS — I2510 Atherosclerotic heart disease of native coronary artery without angina pectoris: Secondary | ICD-10-CM | POA: Insufficient documentation

## 2012-11-10 DIAGNOSIS — M2040 Other hammer toe(s) (acquired), unspecified foot: Secondary | ICD-10-CM | POA: Insufficient documentation

## 2012-11-10 DIAGNOSIS — E2749 Other adrenocortical insufficiency: Secondary | ICD-10-CM | POA: Insufficient documentation

## 2012-11-10 DIAGNOSIS — Z880 Allergy status to penicillin: Secondary | ICD-10-CM | POA: Insufficient documentation

## 2012-11-10 DIAGNOSIS — Q667 Congenital pes cavus, unspecified foot: Secondary | ICD-10-CM | POA: Insufficient documentation

## 2012-11-10 DIAGNOSIS — I428 Other cardiomyopathies: Secondary | ICD-10-CM | POA: Insufficient documentation

## 2012-11-10 HISTORY — PX: HAMMER TOE SURGERY: SHX385

## 2012-11-10 HISTORY — DX: Presence of automatic (implantable) cardiac defibrillator: Z95.810

## 2012-11-10 HISTORY — DX: Presence of dental prosthetic device (complete) (partial): K08.109

## 2012-11-10 HISTORY — DX: Presence of spectacles and contact lenses: Z97.3

## 2012-11-10 HISTORY — DX: Complete loss of teeth, unspecified cause, unspecified class: Z97.2

## 2012-11-10 HISTORY — PX: FOOT ARTHRODESIS: SHX1655

## 2012-11-10 LAB — BASIC METABOLIC PANEL
BUN: 15 mg/dL (ref 6–23)
CO2: 30 mEq/L (ref 19–32)
Chloride: 101 mEq/L (ref 96–112)
Creatinine, Ser: 1.33 mg/dL (ref 0.50–1.35)
GFR calc Af Amer: 63 mL/min — ABNORMAL LOW (ref >90)
Glucose, Bld: 118 mg/dL — ABNORMAL HIGH (ref 70–99)
Potassium: 3.4 mEq/L — ABNORMAL LOW (ref 3.5–5.1)

## 2012-11-10 SURGERY — FUSION, JOINT, FOOT
Anesthesia: Regional | Site: Toe | Laterality: Right | Wound class: Clean

## 2012-11-10 MED ORDER — OXYCODONE HCL 5 MG PO TABS: 5.0000 mg | ORAL_TABLET | ORAL | Status: DC | PRN

## 2012-11-10 MED ORDER — BACITRACIN ZINC 500 UNIT/GM EX OINT
TOPICAL_OINTMENT | CUTANEOUS | Status: DC | PRN
  Administered 2012-11-10: 1 via TOPICAL

## 2012-11-10 MED ORDER — FENTANYL CITRATE 0.05 MG/ML IJ SOLN
50.0000 ug | INTRAMUSCULAR | Status: DC | PRN
  Administered 2012-11-10: 50 ug via INTRAVENOUS

## 2012-11-10 MED ORDER — DEXAMETHASONE SODIUM PHOSPHATE 10 MG/ML IJ SOLN
INTRAMUSCULAR | Status: DC | PRN
  Administered 2012-11-10: 8 mg via INTRAVENOUS

## 2012-11-10 MED ORDER — LACTATED RINGERS IV SOLN
INTRAVENOUS | Status: DC
  Administered 2012-11-10 (×2): via INTRAVENOUS

## 2012-11-10 MED ORDER — PHENYLEPHRINE HCL 10 MG/ML IJ SOLN
10.0000 mg | INTRAVENOUS | Status: DC | PRN
  Administered 2012-11-10: 40 ug/min via INTRAVENOUS

## 2012-11-10 MED ORDER — PROPOFOL 10 MG/ML IV BOLUS
INTRAVENOUS | Status: DC | PRN
  Administered 2012-11-10: 150 mg via INTRAVENOUS

## 2012-11-10 MED ORDER — EPHEDRINE SULFATE 50 MG/ML IJ SOLN
INTRAMUSCULAR | Status: DC | PRN
  Administered 2012-11-10 (×3): 10 mg via INTRAVENOUS

## 2012-11-10 MED ORDER — CEFAZOLIN SODIUM-DEXTROSE 2-3 GM-% IV SOLR
2.0000 g | INTRAVENOUS | Status: AC
  Administered 2012-11-10: 2 g via INTRAVENOUS

## 2012-11-10 MED ORDER — LIDOCAINE HCL (CARDIAC) 20 MG/ML IV SOLN
INTRAVENOUS | Status: DC | PRN
  Administered 2012-11-10: 30 mg via INTRAVENOUS

## 2012-11-10 MED ORDER — 0.9 % SODIUM CHLORIDE (POUR BTL) OPTIME
TOPICAL | Status: DC | PRN
  Administered 2012-11-10: 300 mL

## 2012-11-10 MED ORDER — CHLORHEXIDINE GLUCONATE 4 % EX LIQD: 60.0000 mL | Freq: Once | CUTANEOUS | Status: DC

## 2012-11-10 MED ORDER — MIDAZOLAM HCL 2 MG/2ML IJ SOLN
1.0000 mg | INTRAMUSCULAR | Status: DC | PRN
  Administered 2012-11-10: 1 mg via INTRAVENOUS

## 2012-11-10 MED ORDER — SODIUM CHLORIDE 0.9 % IV SOLN: INTRAVENOUS | Status: DC

## 2012-11-10 MED ORDER — ONDANSETRON HCL 4 MG/2ML IJ SOLN
INTRAMUSCULAR | Status: DC | PRN
  Administered 2012-11-10: 4 mg via INTRAVENOUS

## 2012-11-10 SURGICAL SUPPLY — 103 items
BAG DECANTER FOR FLEXI CONT (MISCELLANEOUS) ×1 IMPLANT
BANDAGE CONFORM 2  STR LF (GAUZE/BANDAGES/DRESSINGS) IMPLANT
BANDAGE CONFORM 3  STR LF (GAUZE/BANDAGES/DRESSINGS) ×1 IMPLANT
BANDAGE ESMARK 6X9 LF (GAUZE/BANDAGES/DRESSINGS) ×1 IMPLANT
BANDAGE GAUZE 4  KLING STR (GAUZE/BANDAGES/DRESSINGS) ×1 IMPLANT
BANDAGE GAUZE ELAST BULKY 4 IN (GAUZE/BANDAGES/DRESSINGS) IMPLANT
BIT DRILL 2.0 (BIT) ×2
BIT DRILL 2XNS DISP SS SM FRAG (BIT) IMPLANT
BIT DRL 2XNS DISP SS SM FRAG (BIT) ×1
BLADE AVERAGE 25X9 (BLADE) ×1 IMPLANT
BLADE MICRO SAGITTAL (BLADE) IMPLANT
BLADE OSC/SAG .038X5.5 CUT EDG (BLADE) IMPLANT
BLADE SURG 15 STRL LF DISP TIS (BLADE) ×3 IMPLANT
BLADE SURG 15 STRL SS (BLADE) ×8
BNDG CMPR 9X4 STRL LF SNTH (GAUZE/BANDAGES/DRESSINGS)
BNDG CMPR 9X6 STRL LF SNTH (GAUZE/BANDAGES/DRESSINGS) ×1
BNDG COHESIVE 4X5 TAN STRL (GAUZE/BANDAGES/DRESSINGS) ×2 IMPLANT
BNDG COHESIVE 6X5 TAN STRL LF (GAUZE/BANDAGES/DRESSINGS) ×3 IMPLANT
BNDG ESMARK 4X9 LF (GAUZE/BANDAGES/DRESSINGS) IMPLANT
BNDG ESMARK 6X9 LF (GAUZE/BANDAGES/DRESSINGS) ×2
CAP PIN ORTHO PINK (CAP) ×1 IMPLANT
CAP PIN PROTECTOR ORTHO WHT (CAP) ×1 IMPLANT
CHLORAPREP W/TINT 26ML (MISCELLANEOUS) ×2 IMPLANT
CLOTH BEACON ORANGE TIMEOUT ST (SAFETY) ×2 IMPLANT
COVER MAYO STAND STRL (DRAPES) IMPLANT
COVER TABLE BACK 60X90 (DRAPES) ×2 IMPLANT
CUFF TOURNIQUET SINGLE 24IN (TOURNIQUET CUFF) IMPLANT
CUFF TOURNIQUET SINGLE 34IN LL (TOURNIQUET CUFF) ×2 IMPLANT
DECANTER SPIKE VIAL GLASS SM (MISCELLANEOUS) IMPLANT
DRAPE C-ARM 42X72 X-RAY (DRAPES) ×1 IMPLANT
DRAPE C-ARMOR (DRAPES) ×1 IMPLANT
DRAPE EXTREMITY T 121X128X90 (DRAPE) ×2 IMPLANT
DRAPE INCISE IOBAN 66X45 STRL (DRAPES) IMPLANT
DRAPE OEC MINIVIEW 54X84 (DRAPES) ×2 IMPLANT
DRAPE U-SHAPE 47X51 STRL (DRAPES) ×2 IMPLANT
DRAPE U-SHAPE 76X120 STRL (DRAPES) IMPLANT
DRSG EMULSION OIL 3X3 NADH (GAUZE/BANDAGES/DRESSINGS) ×3 IMPLANT
DRSG PAD ABDOMINAL 8X10 ST (GAUZE/BANDAGES/DRESSINGS) ×4 IMPLANT
DRSG TEGADERM 2-3/8X2-3/4 SM (GAUZE/BANDAGES/DRESSINGS) IMPLANT
ELECT REM PT RETURN 9FT ADLT (ELECTROSURGICAL) ×2
ELECTRODE REM PT RTRN 9FT ADLT (ELECTROSURGICAL) ×1 IMPLANT
GAUZE SPONGE 4X4 16PLY XRAY LF (GAUZE/BANDAGES/DRESSINGS) IMPLANT
GLOVE BIO SURGEON STRL SZ8 (GLOVE) ×2 IMPLANT
GLOVE BIOGEL PI IND STRL 6.5 (GLOVE) IMPLANT
GLOVE BIOGEL PI IND STRL 8 (GLOVE) ×1 IMPLANT
GLOVE BIOGEL PI INDICATOR 6.5 (GLOVE) ×1
GLOVE BIOGEL PI INDICATOR 8 (GLOVE) ×2
GLOVE ECLIPSE 6.5 STRL STRAW (GLOVE) ×1 IMPLANT
GLOVE EXAM NITRILE MD LF STRL (GLOVE) ×1 IMPLANT
GOWN PREVENTION PLUS XLARGE (GOWN DISPOSABLE) ×2 IMPLANT
GOWN PREVENTION PLUS XXLARGE (GOWN DISPOSABLE) ×2 IMPLANT
K-WIRE 102X1.4 (WIRE) ×3 IMPLANT
K-WIRE ACE 1.6X6 (WIRE) ×4
KWIRE 4.0 X .045IN (WIRE) IMPLANT
KWIRE ACE 1.6X6 (WIRE) IMPLANT
NDL HYPO 25X1 1.5 SAFETY (NEEDLE) IMPLANT
NDL SAFETY ECLIPSE 18X1.5 (NEEDLE) IMPLANT
NEEDLE HYPO 18GX1.5 SHARP (NEEDLE)
NEEDLE HYPO 22GX1.5 SAFETY (NEEDLE) IMPLANT
NEEDLE HYPO 25X1 1.5 SAFETY (NEEDLE) IMPLANT
NS IRRIG 1000ML POUR BTL (IV SOLUTION) ×2 IMPLANT
PACK BASIN DAY SURGERY FS (CUSTOM PROCEDURE TRAY) ×2 IMPLANT
PAD CAST 4YDX4 CTTN HI CHSV (CAST SUPPLIES) ×1 IMPLANT
PADDING CAST ABS 4INX4YD NS (CAST SUPPLIES)
PADDING CAST ABS COTTON 4X4 ST (CAST SUPPLIES) ×1 IMPLANT
PADDING CAST COTTON 4X4 STRL (CAST SUPPLIES) ×2
PADDING CAST COTTON 6X4 STRL (CAST SUPPLIES) ×2 IMPLANT
PENCIL BUTTON HOLSTER BLD 10FT (ELECTRODE) ×2 IMPLANT
PLATE SM 1/4 TUBULAR 5H (Plate) ×1 IMPLANT
SANITIZER HAND PURELL 535ML FO (MISCELLANEOUS) ×2 IMPLANT
SCREW ACE CAN 4.0 46M (Screw) ×1 IMPLANT
SCREW CORTICAL 2.7MM  18MM (Screw) ×1 IMPLANT
SCREW CORTICAL 2.7MM  20MM (Screw) ×2 IMPLANT
SCREW CORTICAL 2.7MM  26MM (Screw) ×1 IMPLANT
SCREW CORTICAL 2.7MM 18MM (Screw) IMPLANT
SCREW CORTICAL 2.7MM 20MM (Screw) IMPLANT
SCREW CORTICAL 2.7MM 26MM (Screw) IMPLANT
SCREW QUICK FIX 2X12 (Screw) ×3 IMPLANT
SHEET MEDIUM DRAPE 40X70 STRL (DRAPES) ×2 IMPLANT
SLEEVE SCD COMPRESS KNEE MED (MISCELLANEOUS) ×2 IMPLANT
SPLINT FAST PLASTER 5X30 (CAST SUPPLIES) ×20
SPLINT PLASTER CAST FAST 5X30 (CAST SUPPLIES) ×20 IMPLANT
SPONGE GAUZE 4X4 12PLY (GAUZE/BANDAGES/DRESSINGS) ×4 IMPLANT
SPONGE LAP 18X18 X RAY DECT (DISPOSABLE) ×2 IMPLANT
STAPLER VISISTAT 35W (STAPLE) IMPLANT
STOCKINETTE 6  STRL (DRAPES) ×1
STOCKINETTE 6 STRL (DRAPES) ×1 IMPLANT
STRIP CLOSURE SKIN 1/2X4 (GAUZE/BANDAGES/DRESSINGS) IMPLANT
SUCTION FRAZIER TIP 10 FR DISP (SUCTIONS) IMPLANT
SUT ETHILON 3 0 PS 1 (SUTURE) ×4 IMPLANT
SUT ETHILON 4 0 PS 2 18 (SUTURE) IMPLANT
SUT MNCRL AB 3-0 PS2 18 (SUTURE) ×2 IMPLANT
SUT VIC AB 0 SH 27 (SUTURE) ×1 IMPLANT
SUT VIC AB 2-0 SH 18 (SUTURE) ×1 IMPLANT
SUT VIC AB 2-0 SH 27 (SUTURE)
SUT VIC AB 2-0 SH 27XBRD (SUTURE) IMPLANT
SUT VICRYL 4-0 PS2 18IN ABS (SUTURE) IMPLANT
SYR BULB 3OZ (MISCELLANEOUS) ×2 IMPLANT
SYR CONTROL 10ML LL (SYRINGE) IMPLANT
TOWEL OR 17X24 6PK STRL BLUE (TOWEL DISPOSABLE) ×4 IMPLANT
TUBE CONNECTING 20X1/4 (TUBING) ×1 IMPLANT
UNDERPAD 30X30 INCONTINENT (UNDERPADS AND DIAPERS) ×2 IMPLANT
WATER STERILE IRR 1000ML POUR (IV SOLUTION) ×1 IMPLANT

## 2012-11-10 NOTE — Progress Notes (Signed)
Assisted Dr. Joslin with right, ultrasound guided, popliteal block. Side rails up, monitors on throughout procedure. See vital signs in flow sheet. Tolerated Procedure well. 

## 2012-11-10 NOTE — Anesthesia Procedure Notes (Addendum)
Procedure Name: LMA Insertion Date/Time: 11/10/2012 9:37 AM Performed by: BLOCKER, TIMOTHY D Pre-anesthesia Checklist: Patient identified, Emergency Drugs available, Suction available and Patient being monitored Patient Re-evaluated:Patient Re-evaluated prior to inductionOxygen Delivery Method: Circle System Utilized Preoxygenation: Pre-oxygenation with 100% oxygen Intubation Type: IV induction Ventilation: Mask ventilation without difficulty LMA: LMA inserted LMA Size: 5.0 Number of attempts: 1 Airway Equipment and Method: bite block Placement Confirmation: positive ETCO2 Tube secured with: Tape Dental Injury: Teeth and Oropharynx as per pre-operative assessment    Anesthesia Regional Block:  Popliteal block  Pre-Anesthetic Checklist: ,, timeout performed, Correct Patient, Correct Site, Correct Laterality, Correct Procedure, Correct Position, site marked, Risks and benefits discussed,  Surgical consent,  Pre-op evaluation,  At surgeon's request and post-op pain management  Laterality: Right  Prep: chloraprep       Needles:  Injection technique: Single-shot  Needle Type: Echogenic Stimulator Needle     Needle Length:cm 9 cm Needle Gauge: 22 and 22 G    Additional Needles:  Procedures: ultrasound guided (picture in chart) Popliteal block Narrative:  Start time: 11/10/2012 9:10 AM End time: 11/10/2012 9:15 AM Injection made incrementally with aspirations every 5 mL.  Performed by: Personally   Additional Notes: 25 cc 0.5% Naropin injected easily  Kipp Brood  Popliteal block

## 2012-11-10 NOTE — Anesthesia Postprocedure Evaluation (Signed)
  Anesthesia Post-op Note  Patient: [name]  Procedure(s) Performed: Procedure(s): Right Hallux Metatarsal-phalangeal Joint Arthrodesis; Second Metatarsal Head Resection and Second, Third and Fourth Metatarsal Weil Osteotomy (Right) Right Second, Third, and Fourth Hammertoe Corrections (Right)  Patient Location: PACU  Anesthesia Type:General and GA combined with regional for post-op pain  Level of Consciousness: awake, alert  and oriented  Airway and Oxygen Therapy: Patient Spontanous Breathing and Patient connected to nasal cannula oxygen  Post-op Pain: none  Post-op Assessment: Post-op Vital signs reviewed, Patient's Cardiovascular Status Stable, Respiratory Function Stable, Patent Airway and No signs of Nausea or vomiting  Post-op Vital Signs: stable  Complications: No apparent anesthesia complications

## 2012-11-10 NOTE — Progress Notes (Signed)
Pt c/o needing to void - attempted to void in urinal but not successful. Stated he felt like he was going to pop. Pt taken to bathroom - up to bathroom with walker - noted bleeding on all four toes small amount - reinforced with  4x4 gauze on toes. Wanted to sit in bathroom to try to urinate .

## 2012-11-10 NOTE — Anesthesia Preprocedure Evaluation (Addendum)
Anesthesia Evaluation  Patient identified by MRN, date of birth, ID band Patient awake    Reviewed: Allergy & Precautions, H&P , NPO status , Patient's Chart, lab work & pertinent test results, reviewed documented beta blocker date and time   Airway Mallampati: II      Dental  (+) Edentulous Upper and Edentulous Lower   Pulmonary  breath sounds clear to auscultation        Cardiovascular Rhythm:Regular Rate:Normal     Neuro/Psych    GI/Hepatic   Endo/Other    Renal/GU      Musculoskeletal   Abdominal (+) + obese,  Abdomen: soft.    Peds  Hematology   Anesthesia Other Findings   Reproductive/Obstetrics                           Anesthesia Physical Anesthesia Plan  ASA: III  Anesthesia Plan: General   Post-op Pain Management:    Induction: Intravenous  Airway Management Planned: LMA  Additional Equipment:   Intra-op Plan:   Post-operative Plan:   Informed Consent: I have reviewed the patients History and Physical, chart, labs and discussed the procedure including the risks, benefits and alternatives for the proposed anesthesia with the patient or authorized representative who has indicated his/her understanding and acceptance.     Plan Discussed with: CRNA, Anesthesiologist and Surgeon  Anesthesia Plan Comments: (CAD with occluded RCA S/P inferior wall MI 2007, clinically Stable Cardiomyopathy with AICD and Bivent pacer  Htn H/O ETOH abuse Gout  Plan GA with Popliteal Block and LMA  Kipp Brood, MD)        Anesthesia Quick Evaluation

## 2012-11-10 NOTE — H&P (Signed)
[name] is an 67 y.o. male.   Chief Complaint: right forefoot pain HPI: 67 y/o male with PMH of CAD and COPD has severe hallux varus deformity as well as 2nd, 3rd and 4th hammertoe deformities.  He presents now for operative correction having failed nonoperative treatment.  Past Medical History  Diagnosis Date  . Essential hypertension, benign   . Mixed hyperlipidemia   . Cardiomyopathy     Mixed, LVEF 35% up to 50%  . H/O alcohol abuse   . Depression   . Obesity   . COPD (chronic obstructive pulmonary disease)   . Osteoarthritis   . DDD (degenerative disc disease)   . GERD (gastroesophageal reflux disease)   . ACTH deficiency     Dr. Lucianne Muss  . ICD (implantable cardiac defibrillator) in place   . Shortness of breath   . Sleep apnea     Noncompliance with CPAP therapy  . Full dentures   . Wears glasses   . Coronary atherosclerosis of native coronary artery     Occluded RCA with good collaterals    Past Surgical History  Procedure Laterality Date  . Umbilical hernia repair    . Knee surgery    . Ankle surgery  right  . Femur surgery  right  . Nasal sinus surgery    . Icd implantation  2010    St Jude - Dr. Johney Frame  . Transurethral resection of prostate  2013  . Back surgery      Multiple back surgeries for chronic back pain and lumbar fusion    Family History  Problem Relation Age of Onset  . Coronary artery disease    . Arthritis     Social History:  reports that he quit smoking about 15 years ago. His smoking use included Cigarettes. He has a 36 pack-year smoking history. He has never used smokeless tobacco. He reports that  drinks alcohol. He reports that he does not use illicit drugs.  Allergies:  Allergies  Allergen Reactions  . Penicillins     REACTION: hives    Medications Prior to Admission  Medication Sig Dispense Refill  . amitriptyline (ELAVIL) 25 MG tablet Take 1 tablet by mouth Daily.      Marland Kitchen aspirin 325 MG tablet Take 325 mg by mouth daily.       . Cholecalciferol (VITAMIN D3) 5000 UNITS CAPS Take 1 capsule by mouth daily.      . diazepam (VALIUM) 10 MG tablet Take 10 mg by mouth every 12 (twelve) hours as needed.       . fish oil-omega-3 fatty acids 1000 MG capsule Take 1 g by mouth daily.      . furosemide (LASIX) 80 MG tablet Take 80 mg by mouth daily.      Marland Kitchen gabapentin (NEURONTIN) 300 MG capsule Take 600 mg by mouth 3 (three) times daily.       . metoprolol succinate (TOPROL-XL) 25 MG 24 hr tablet Take 25 mg by mouth daily.      Maxwell Caul Bicarbonate (ZEGERID) 20-1100 MG CAPS Take 1 capsule by mouth daily before breakfast.      . oxyCODONE (OXY IR/ROXICODONE) 5 MG immediate release tablet Take 15 mg by mouth every 6 (six) hours as needed.      Marland Kitchen oxyCODONE (OXYCONTIN) 80 MG 12 hr tablet Take 80 mg by mouth every 8 (eight) hours.       . rosuvastatin (CRESTOR) 10 MG tablet Take 10 mg by mouth daily.      Marland Kitchen  venlafaxine XR (EFFEXOR-XR) 150 MG 24 hr capsule Take 1 tablet by mouth Daily.      Marland Kitchen VIIBRYD 40 MG TABS Take 1 tablet by mouth Daily.      Marland Kitchen allopurinol (ZYLOPRIM) 300 MG tablet Take 300 mg by mouth daily.      . fenofibrate 160 MG tablet Take 160 mg by mouth daily.        . magnesium citrate SOLN Take 1 Bottle by mouth as needed.        No results found for this or any previous visit (from the past 48 hour(s)). No results found.  ROS  No recent f/c/n/v/wt loss.  No heart issues recently.  Blood pressure 119/82, pulse 61, temperature 98.1 F (36.7 C), temperature source Oral, resp. rate 20, height 5\' 9"  (1.753 m), weight 97.523 kg (215 lb), SpO2 100.00%. Physical Exam  [age] wd male appearing older than his stated age.  A and O.  Mood and affect normal.  EOMI.  Resp unlabored.  R forefoot with severe hallux varus deformity.  2-4 hammertoes.  Skin intact.  2+ dp and pt pulses.  Feels LT normally.  5/5 strength in PF and DF of the ankle.    Assessment/Plan Right hallux varus and 2-4 hammertoes - to OR for hallux  MPJ arthrodesis; 2-3 weil osteotomies; 2-4 hammertoe corrections.  The risks and benefits of the alternative treatment options have been discussed in detail.  The patient wishes to proceed with surgery and specifically understands risks of bleeding, infection, nerve damage, blood clots, need for additional surgery, amputation and death.   Toni Arthurs November 11, 2012, 8:52 AM

## 2012-11-10 NOTE — Transfer of Care (Signed)
Immediate Anesthesia Transfer of Care Note  Patient: [name]  Procedure(s) Performed: Procedure(s): Right Hallux Metatarsal-phalangeal Joint Arthrodesis; Second Metatarsal Head Resection and Second, Third and Fourth Metatarsal Weil Osteotomy (Right) Right Second, Third, and Fourth Hammertoe Corrections (Right)  Patient Location: PACU  Anesthesia Type:GA combined with regional for post-op pain  Level of Consciousness: awake, alert , oriented and patient cooperative  Airway & Oxygen Therapy: Patient Spontanous Breathing and Patient connected to face mask oxygen  Post-op Assessment: Report given to PACU RN and Post -op Vital signs reviewed and stable  Post vital signs: Reviewed and stable  Complications: No apparent anesthesia complications

## 2012-11-10 NOTE — Brief Op Note (Signed)
11/10/2012  11:47 AM  PATIENT:  [name]  67 y.o. male  PRE-OPERATIVE DIAGNOSIS:  1.  Right hallux varus      2.  Right 2nd and 3rd hammertoes      3.  Right 4th mallet toe  POST-OPERATIVE DIAGNOSIS:  1.  Right hallux varus and rigidus      2.  Right 2nd claw toe      3.  Right 3rd and 4th hammertoes      4.  Flexor hallucis longus tendon contracture      5.  Bunion deformity of the right 1st metatarsal Procedure(s): 1.  Right hallux MPJ arthrodesis 2.  Percutaneous right FHL tenotomy 3.  Right 2nd MT head resection 4.  Right 3rd Weil osteotomy 5.  Right 4th weil osteotomy 6.  Right 2nd, 3rd and 4th MTPJ dorsal capsulotomies and extensor tendon lengthening 7.  Right 2nd, 3rd and 4th hammertoe corrections (PIP joint arthrodeses) 8.  AP and Lateral radiographs of the right foot 9.  R 1st MT silver bunionectomy  SURGEON:  Toni Arthurs, MD  ASSISTANT: n/a  ANESTHESIA:   General, regional  EBL:  minimal   TOURNIQUET:   Total Tourniquet Time Documented: Thigh (Right) - 82 minutes Total: Thigh (Right) - 82 minutes   COMPLICATIONS:  None apparent  DISPOSITION:  Extubated, awake and stable to recovery.  DICTATION ID:  [id number]

## 2012-11-11 NOTE — Op Note (Signed)
NAMEMARQUIST, BINSTOCK               ACCOUNT NO.:  [id number]  MEDICAL RECORD NO.:  [id number]  LOCATION:                                 FACILITY:  PHYSICIAN:  Toni Arthurs, MD             DATE OF BIRTH:  DATE OF PROCEDURE:  11/10/2012 DATE OF DISCHARGE:                              OPERATIVE REPORT   PREOPERATIVE DIAGNOSES: 1. Right hallux varus. 2. Right second and third hammer toes. 3. Right 4th mallet toe.  POSTOPERATIVE DIAGNOSES: 1. Right hallux varus. 2. Right second claw toe deformity. 3. Right third and fourth hammertoe deformities. 4. Right flexor hallucis longus tendon contracture.  PROCEDURES: 1. Right hallux MP joint arthrodesis. 2. Percutaneous right flexor hallucis longus tenotomy. 3. Right second metatarsal head resection. 4. Right third metatarsal Weil osteotomy. 5. Right fourth metatarsal Weil osteotomy. 6. Right second, third, and fourth MTP joint dorsal capsulotomies with     extensor tendon lengthening. 7. Right second, third, and fourth hammertoe corrections (proximal     interphalangeal joint arthrodesis). 8. AP and lateral radiographs of the right foot. 9. Right bunionectomy.  SURGEON:  Toni Arthurs, MD.  ANESTHESIA:  General, regional.  ESTIMATED BLOOD LOSS:  Minimal.  TOURNIQUET TIME:  82 minutes at 250 mmHg.  COMPLICATIONS:  None apparent.  DISPOSITION:  Extubated, awake, and stable to recovery.  INDICATIONS FOR PROCEDURE:  The patient is a 67 year old male with a past medical history significant for coronary artery disease, who presents with a rather severe right hallux varus deformity, as well as second and third hammertoes and fourth mallet toe.  These painful forefoot deformities have been bothersome despite shoe wear and activity modifications.  He presents now for operative treatment of these painful forefoot deformities.  He understands the risks and benefits, the alternative treatment options, and elects surgical treatment.   He specifically understands risks of bleeding, infection, nerve damage, blood clots, need for additional surgery, recurrence of his deformity, continued pain, amputation, and death.  PROCEDURE IN DETAIL:  After preoperative consent was obtained and the correct operative site was identified, the patient was brought to the operating room and placed supine on the operating table.  General anesthesia was induced.  Preoperative antibiotics were administered. Surgical time-out was taken.  The right lower extremity was prepped and draped in standard sterile fashion with a tourniquet around the thigh. The extremity was exsanguinated and tourniquet was inflated to 250 mmHg. A longitudinal incision was made over the dorsal hallux MP joint.  Sharp dissection was carried down through the skin and subcutaneous tissue. An arthrotomy was made, protecting the EHL tendon.  The EHP tendon was released from the proximal phalanx.  The head of the metatarsal was exposed.  A convex reamer was used to remove all of the degenerated cartilage.  The patient was also noted to have a fairly prominent medial eminence.  This was resected with an oscillating saw, as a simple bunionectomy.  The convex reamer was then used to remove all the cartilage from the base of the proximal phalanx.  The wound was irrigated copiously.  A 2-mm drill bit was then used to perforate the head of  the metatarsal and the base of the proximal phalanx and multiple locations generating some autograft bone.  This was left in the joint and the joint was reduced.  It was provisionally pinned.  AP and lateral fluoroscopic images confirmed appropriate position of the hallux MP joint.  A K-wire was then inserted obliquely across the joint in percutaneous fashion.  AP and lateral fluoroscopic images confirmed appropriate position of the guidewire.  A cannulated 4-mm partially threaded screw was then inserted over the guidewire through a small  stab incision.  This compressed the joint appropriately.  A 5-hole 1/4 tubular plate was then selected and positioned on the dorsum of the joint.  It was fixed distally with 2 bicortical screws and proximally with 2 bicortical screws.  Final AP and lateral fluoroscopic images confirmed appropriate position and length of all hardware and appropriate reduction of the hallux MP joint.  The IP joint of the hallux was noted to be in a significantly flexed position.  The flexor hallucis longus tendon was noted to be contracted.  A percutaneous release was then performed with a #15 blade taking care to protect neurovascular structures.  This allowed passive dorsiflexion of the toe to a neutral position.  Attention was then turned to the dorsum of the foot where a longitudinal incision was made over the fourth MTP joint.  Sharp dissection was carried down through the skin and subcutaneous tissue.  The extensor tendons were lengthened in Z fashion, and a dorsal capsulotomy was made. A 2nd longitudinal incision was made between the 2nd and 3rd toes in the web space.  The 3rd MTP joint was exposed and again the extensor tendons were lengthened in Z fashion and the dorsal capsulotomy was made with a #15 blade.  The 2nd MTP joint was noted to be dislocated with the proximal phalanx lying on top of the metatarsal and all of the soft tissues extremely contracted.  The extensor tendons were lengthened in Z fashion.  The dorsal joint capsule was excised in its entirety.  The metatarsal head was exposed and was noted to be severely degenerated with almost complete loss of cartilage and collapse of the head.  This portion of the head was then resected with an oscillating saw, allowing passive reduction of the 2nd MTP joint.  The patient was then returned to the fourth metatarsal were a Weil osteotomy was made removing a small wafer of bone proximally.  The metatarsal head was allowed to retract proximally  several millimeters and this was fixed with a 2 mm partially threaded screw.  The 3rd metatarsal head was then shortened again with a Weil osteotomy removing a small wafer of bone dorsally.  This again was fixed with a 2-mm screw.  At this point, attention was turned to the second toe PIP joint where a transverse incision was made.  Sharp dissection was carried down through the skin and extensor tendon.  The proximal phalanx was exposed and the head was resected with an oscillating saw.  The base of the middle phalanx was similarly resected with an oscillating saw.  The K-wire was inserted out through the tip of the toe and then retrograde across the PIP joint.  The toe was held in a reduced position and the K-wire was advanced across the MP joint into the second metatarsal shaft.  The same PIP arthrodesis was performed for the third and fourth toes in the same fashion.  Again K-wires were used to pin the IP and MP joints holding the  toes out straight.  Final AP and lateral fluoroscopic images confirmed appropriate position and length of all 3 pins.  All 3 pins were then bent, trimmed, and capped.  The extensor tendons were repaired with 2-0 Vicryl sutures.  The skin incisions were closed with running 3- 0 nylon sutures.  The transverse incisions were closed with horizontal mattress sutures of 3-0 nylon.  The tourniquet was released.  Hemostasis was achieved.  The hallux MP joint incision was closed with 2-0 Vicryl sutures, repairing the joint capsule over the plate.  The skin was closed with a running 3-0 nylon suture.  Sterile dressings were applied followed by a well-padded, short-leg splint.  The patient was then awakened from anesthesia and transported to recovery room in stable condition.  FOLLOWUP PLAN:  The patient will be nonweightbearing on his right lower extremity.  He will follow up with me in 2 weeks for suture removal and conversion to a Cam walker boot.  RADIOGRAPHS:   AP and lateral views simulated weight bearing of the right foot were obtained.  These show interval arthrodesis of the hallux MPJ as well as resection of the 2nd MT head and osteotomies of the 3rd and 4th MTs.  Pins transfix the 2nd, 3rd and 4th toes.  No fracture or dislocation is noted.  Normal alignment otherwise.   Toni Arthurs, MD     JH/MEDQ  D:  11/10/2012  T:  11/11/2012  Job:  [id number]

## 2012-11-16 ENCOUNTER — Encounter (HOSPITAL_BASED_OUTPATIENT_CLINIC_OR_DEPARTMENT_OTHER): Payer: Self-pay | Admitting: Orthopedic Surgery

## 2013-01-11 ENCOUNTER — Ambulatory Visit (INDEPENDENT_AMBULATORY_CARE_PROVIDER_SITE_OTHER): Payer: Medicare Other | Admitting: Internal Medicine

## 2013-01-11 ENCOUNTER — Encounter: Payer: Self-pay | Admitting: Internal Medicine

## 2013-01-11 VITALS — BP 105/72 | HR 68 | Ht 70.0 in | Wt 209.1 lb

## 2013-01-11 DIAGNOSIS — I429 Cardiomyopathy, unspecified: Secondary | ICD-10-CM

## 2013-01-11 DIAGNOSIS — Z95810 Presence of automatic (implantable) cardiac defibrillator: Secondary | ICD-10-CM

## 2013-01-11 DIAGNOSIS — I5022 Chronic systolic (congestive) heart failure: Secondary | ICD-10-CM | POA: Insufficient documentation

## 2013-01-11 LAB — ICD DEVICE OBSERVATION
AL AMPLITUDE: 4.2 mv
AL IMPEDENCE ICD: 375 Ohm
AL THRESHOLD: 0.75 v
ATRIAL PACING ICD: 42 pct
BAMS-0001: 150 {beats}/min
BAMS-0003: 70 {beats}/min
CHARGE TIME: 9.4 s
DEV-0020ICD: NEGATIVE
DEVICE MODEL ICD: 593609
FVT: 0
HV IMPEDENCE: 54 Ohm
MODE SWITCH EPISODES: 8
PACEART VT: 0
RV LEAD AMPLITUDE: 11.7 mv
RV LEAD IMPEDENCE ICD: 400 Ohm
RV LEAD THRESHOLD: 1.25 v
TOT-0006: 20101019000000
TOT-0007: 1
TOT-0008: 0
TOT-0009: 1
TOT-0010: 11
TZON-0003SLOWVT: 355 ms
TZON-0004SLOWVT: 30
TZON-0005SLOWVT: 6
TZON-0010SLOWVT: 80 ms
VENTRICULAR PACING ICD: 0.09 pct
VF: 0

## 2013-01-11 NOTE — Progress Notes (Signed)
PCP:  Samuel Jester, DO Primary Cardiologist: Dr Diona Browner  The patient presents today for routine electrophysiology followup.  Since last being seen in our clinic, the patient reports doing very well. He is limited by musculoskeletal concerns.  He recently had toe amputation due to a chronic wound related to prior trauma.  Today, he denies symptoms of palpitations, chest pain, shortness of breath, orthopnea, PND, dizziness, presyncope, syncope, or ICD shocks.  He has stable mild edema. The patient feels that he is tolerating medications without difficulties and is otherwise without complaint today.   Past Medical History  Diagnosis Date  . Essential hypertension, benign   . Mixed hyperlipidemia   . Cardiomyopathy     Mixed, LVEF 35% up to 50%  . H/O alcohol abuse   . Depression   . Obesity   . COPD (chronic obstructive pulmonary disease)   . Osteoarthritis   . DDD (degenerative disc disease)   . GERD (gastroesophageal reflux disease)   . ACTH deficiency     Dr. Lucianne Muss  . ICD (implantable cardiac defibrillator) in place   . Shortness of breath   . Sleep apnea     Noncompliance with CPAP therapy  . Full dentures   . Wears glasses   . Coronary atherosclerosis of native coronary artery     Occluded RCA with good collaterals   Past Surgical History  Procedure Laterality Date  . Umbilical hernia repair    . Knee surgery    . Ankle surgery  right  . Femur surgery  right  . Nasal sinus surgery    . Icd implantation  06/18/2009    St Jude Fortify DR implanted by Dr. Johney Frame  . Transurethral resection of prostate  2013  . Back surgery      Multiple back surgeries for chronic back pain and lumbar fusion  . Foot arthrodesis Right 11/10/2012    Procedure: Right Hallux Metatarsal-phalangeal Joint Arthrodesis; Second Metatarsal Head Resection and Second, Third and Fourth Metatarsal Weil Osteotomy;  Surgeon: Toni Arthurs, MD;  Location: [facility];  Service: Orthopedics;   Laterality: Right;  . Hammer toe surgery Right 11/10/2012    Procedure: Right Second, Third, and Fourth Hammertoe Corrections;  Surgeon: Toni Arthurs, MD;  Location: [facility];  Service: Orthopedics;  Laterality: Right;    Current Outpatient Prescriptions  Medication Sig Dispense Refill  . aspirin 81 MG tablet Take 81 mg by mouth daily.      . cephALEXin (KEFLEX) 500 MG capsule Take 500 mg by mouth 3 (three) times daily.      . Cholecalciferol (VITAMIN D3) 5000 UNITS CAPS Take 1 capsule by mouth daily.      . diazepam (VALIUM) 10 MG tablet Take 10 mg by mouth every 12 (twelve) hours as needed.       . fenofibrate 160 MG tablet Take 160 mg by mouth daily.        . fish oil-omega-3 fatty acids 1000 MG capsule Take 1 g by mouth daily.      . furosemide (LASIX) 80 MG tablet Take 80 mg by mouth daily.      Marland Kitchen gabapentin (NEURONTIN) 300 MG capsule Take 600 mg by mouth as directed. 1 tablet three times a day and 2 at bedtime.      . metoprolol succinate (TOPROL-XL) 25 MG 24 hr tablet Take 25 mg by mouth 2 (two) times daily.       Maxwell Caul Bicarbonate (ZEGERID) 20-1100 MG CAPS Take 1  capsule by mouth daily before breakfast.      . oxyCODONE (OXY IR/ROXICODONE) 5 MG immediate release tablet Take 15 mg by mouth every 6 (six) hours as needed.      Marland Kitchen oxyCODONE (OXYCONTIN) 80 MG 12 hr tablet Take 80 mg by mouth every 8 (eight) hours.       . rosuvastatin (CRESTOR) 10 MG tablet Take 20 mg by mouth daily.       . tamsulosin (FLOMAX) 0.4 MG CAPS Take 0.4 mg by mouth daily after supper.      Marland Kitchen VIIBRYD 40 MG TABS Take 1 tablet by mouth Daily.       No current facility-administered medications for this visit.    Allergies  Allergen Reactions  . Penicillins     REACTION: hives    History   Social History  . Marital Status: Divorced    Spouse Name: N/A    Number of Children: N/A  . Years of Education: N/A   Occupational History  . Not on file.   Social History Main  Topics  . Smoking status: Former Smoker -- 1.00 packs/day for 36 years    Types: Cigarettes    Quit date: 05/26/1997  . Smokeless tobacco: Never Used  . Alcohol Use: Yes     Comment: History of abuse. Still admits to occasional binges  . Drug Use: No  . Sexually Active: Not on file   Other Topics Concern  . Not on file   Social History Narrative  . No narrative on file    Family History  Problem Relation Age of Onset  . Coronary artery disease    . Arthritis      Physical Exam: Filed Vitals:   01/11/13 0913  BP: 105/72  Pulse: 68  Height: 5\' 10"  (1.778 m)  Weight: 209 lb 1.3 oz (94.838 kg)    GEN- The patient is well appearing, alert and oriented x 3 today.   Head- normocephalic, atraumatic Eyes-  Sclera clear, conjunctiva pink Ears- hearing intact Oropharynx- clear Neck- supple, no JVP Lymph- no cervical lymphadenopathy Lungs- Clear to ausculation bilaterally, normal work of breathing Chest- ICD pocket is well healed Heart- Regular rate and rhythm, no murmurs, rubs or gallops, PMI not laterally displaced GI- soft, NT, ND, + BS Extremities- no clubbing, cyanosis, or edema  ICD interrogation- reviewed in detail today,  See PACE[facility] report  Assessment and Plan:  1. 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

## 2013-01-11 NOTE — Patient Instructions (Signed)
Continue all current medications. Allred - 1 year Remote check 04/17/2013

## 2013-01-20 ENCOUNTER — Encounter: Payer: Self-pay | Admitting: Cardiology

## 2013-01-20 ENCOUNTER — Ambulatory Visit (INDEPENDENT_AMBULATORY_CARE_PROVIDER_SITE_OTHER): Payer: Medicare Other | Admitting: Cardiology

## 2013-01-20 VITALS — BP 100/72 | HR 72 | Ht 70.0 in | Wt 206.8 lb

## 2013-01-20 DIAGNOSIS — I429 Cardiomyopathy, unspecified: Secondary | ICD-10-CM

## 2013-01-20 DIAGNOSIS — Z95810 Presence of automatic (implantable) cardiac defibrillator: Secondary | ICD-10-CM

## 2013-01-20 DIAGNOSIS — I2510 Atherosclerotic heart disease of native coronary artery without angina pectoris: Secondary | ICD-10-CM

## 2013-01-20 NOTE — Assessment & Plan Note (Signed)
Normalization of LV function by echocardiogram in May of last year. Continue current regimen. Weight is down 3 pounds.

## 2013-01-20 NOTE — Progress Notes (Signed)
Clinical Summary [name] is a medically complex 67 y.o.male last seen in October 2013. From a cardiac perspective he has been doing reasonably well. Denies any significant angina symptoms, reports compliance with his medications. He had a recent device followup with Dr. Johney Frame. Mention that his Coreview was elevated, however his weight is actually down 3 pounds, and he reports NYHA class II dyspnea.  Lab work from March reveals BUN 15, creatinine 1.3, potassium 3.4, hemoglobin 10.1.  Most recent echocardiogram from May 2013 revealed improvement in LV function with EF of 55-60%, diastolic dysfunction, RVSP 40-45 mm mercury.   Allergies  Allergen Reactions  . Penicillins     REACTION: hives    Current Outpatient Prescriptions  Medication Sig Dispense Refill  . aspirin 81 MG tablet Take 81 mg by mouth daily.      . Cholecalciferol (VITAMIN D3) 5000 UNITS CAPS Take 1 capsule by mouth daily.      . diazepam (VALIUM) 10 MG tablet Take 10 mg by mouth every 12 (twelve) hours as needed.       . fenofibrate 160 MG tablet Take 160 mg by mouth daily.        . fish oil-omega-3 fatty acids 1000 MG capsule Take 1 g by mouth daily.      . furosemide (LASIX) 80 MG tablet Take 80 mg by mouth daily.      Marland Kitchen gabapentin (NEURONTIN) 300 MG capsule Take 600 mg by mouth as directed. 1 tablet three times a day and 2 at bedtime.      [name] Bicarbonate (ZEGERID) 20-1100 MG CAPS Take 1 capsule by mouth daily before breakfast.      . oxyCODONE (OXY IR/ROXICODONE) 5 MG immediate release tablet Take 15 mg by mouth every 6 (six) hours as needed.      Marland Kitchen oxyCODONE (OXYCONTIN) 80 MG 12 hr tablet Take 80 mg by mouth every 8 (eight) hours.       . rosuvastatin (CRESTOR) 10 MG tablet Take 20 mg by mouth daily.       . tamsulosin (FLOMAX) 0.4 MG CAPS Take 0.4 mg by mouth daily after supper.      Marland Kitchen VIIBRYD 40 MG TABS Take 1 tablet by mouth Daily.      . cephALEXin (KEFLEX) 500 MG capsule Take 500 mg by  mouth 3 (three) times daily.      . metoprolol succinate (TOPROL-XL) 25 MG 24 hr tablet Take 25 mg by mouth 2 (two) times daily.        No current facility-administered medications for this visit.    Past Medical History  Diagnosis Date  . Essential hypertension, benign   . Mixed hyperlipidemia   . Cardiomyopathy     Mixed, LVEF 35% up to 50%  . H/O alcohol abuse   . Depression   . Obesity   . COPD (chronic obstructive pulmonary disease)   . Osteoarthritis   . DDD (degenerative disc disease)   . GERD (gastroesophageal reflux disease)   . ACTH deficiency     Dr. Lucianne Muss  . ICD (implantable cardiac defibrillator) in place   . Shortness of breath   . Sleep apnea     Noncompliance with CPAP therapy  . Full dentures   . Wears glasses   . Coronary atherosclerosis of native coronary artery     Occluded RCA with good collaterals    Past Surgical History  Procedure Laterality Date  . Umbilical hernia repair    . Knee surgery    .  Ankle surgery  right  . Femur surgery  right  . Nasal sinus surgery    . Icd implantation  06/18/2009    St Jude Fortify DR implanted by Dr. Johney Frame  . Transurethral resection of prostate  2013  . Back surgery      Multiple back surgeries for chronic back pain and lumbar fusion  . Foot arthrodesis Right 11/10/2012    Procedure: Right Hallux Metatarsal-phalangeal Joint Arthrodesis; Second Metatarsal Head Resection and Second, Third and Fourth Metatarsal Weil Osteotomy;  Surgeon: Toni Arthurs, MD;  Location: [facility];  Service: Orthopedics;  Laterality: Right;  . Hammer toe surgery Right 11/10/2012    Procedure: Right Second, Third, and Fourth Hammertoe Corrections;  Surgeon: Toni Arthurs, MD;  Location: [facility];  Service: Orthopedics;  Laterality: Right;    Social History [name] reports that he quit smoking about 15 years ago. His smoking use included Cigarettes. He has a 36 pack-year smoking history. He has never used  smokeless tobacco. [name] reports that  drinks alcohol.  Review of Systems No palpitations or device discharges. Stable appetite. Recovering from toe surgery, has somewhat limited ambulation.  Physical Examination Filed Vitals:   01/20/13 0854  BP: 100/72  Pulse: 72   Filed Weights   01/20/13 0854  Weight: 206 lb 12 oz (93.781 kg)    HEENT: Conjunctiva and lids normal, oropharynx clear.  Neck: Supple, no elevated JVP, no thyromegaly.  Lungs: Decreased but clear to auscultation, nonlabored breathing at rest.  Cardiac: Regular rate and rhythm with frequent ectopy, no S3, no pericardial rub.  Abdomen: Obese, nontender, bowel sounds present, no guarding or rebound.  Extremities: Mild chronic appearing edema, distal pulses 1+.  Skin: Warm and dry.  Musculoskeletal: No kyphosis.  Neuropsychiatric: Alert and oriented x3, affect grossly appropriate.   Problem List and Plan   CAD, NATIVE VESSEL Symptomatically stable on current medical regimen. No changes made today.  CARDIOMYOPATHY, SECONDARY Normalization of LV function by echocardiogram in May of last year. Continue current regimen. Weight is down 3 pounds.  ICD-St.Jude Keep followup with Dr. Johney Frame.    Jonelle Sidle, M.D., F.A.C.C.

## 2013-01-20 NOTE — Assessment & Plan Note (Signed)
Keep followup with Dr. Allred. 

## 2013-01-20 NOTE — Assessment & Plan Note (Signed)
Symptomatically stable on current medical regimen. No changes made today. 

## 2013-01-20 NOTE — Patient Instructions (Signed)
Continue all current medications. Your physician wants you to follow up in: 6 months.  You will receive a reminder letter in the mail one-two months in advance.  If you don't receive a letter, please call our office to schedule the follow up appointment   

## 2013-01-24 ENCOUNTER — Encounter: Payer: Self-pay | Admitting: Cardiology

## 2013-02-21 ENCOUNTER — Other Ambulatory Visit: Payer: Self-pay | Admitting: Cardiology

## 2013-02-22 ENCOUNTER — Other Ambulatory Visit: Payer: Self-pay | Admitting: Cardiology

## 2013-02-22 MED ORDER — METOPROLOL SUCCINATE ER 25 MG PO TB24: 25.0000 mg | ORAL_TABLET | Freq: Two times a day (BID) | ORAL | Status: DC

## 2013-02-22 NOTE — Telephone Encounter (Signed)
Medication sent via escribe.  

## 2013-04-17 ENCOUNTER — Ambulatory Visit (INDEPENDENT_AMBULATORY_CARE_PROVIDER_SITE_OTHER): Payer: Medicare Other | Admitting: *Deleted

## 2013-04-17 ENCOUNTER — Encounter: Payer: Self-pay | Admitting: Internal Medicine

## 2013-04-17 DIAGNOSIS — I5022 Chronic systolic (congestive) heart failure: Secondary | ICD-10-CM

## 2013-04-17 DIAGNOSIS — Z95810 Presence of automatic (implantable) cardiac defibrillator: Secondary | ICD-10-CM

## 2013-04-17 DIAGNOSIS — I429 Cardiomyopathy, unspecified: Secondary | ICD-10-CM

## 2013-04-17 LAB — REMOTE ICD DEVICE
AL IMPEDENCE ICD: 360 Ohm
BAMS-0001: 150 {beats}/min
DEV-0020ICD: NEGATIVE
RV LEAD AMPLITUDE: 11.7 mv
RV LEAD IMPEDENCE ICD: 390 Ohm
TZON-0003SLOWVT: 355 ms
TZON-0004SLOWVT: 30
TZON-0005SLOWVT: 6

## 2013-04-26 ENCOUNTER — Encounter: Payer: Self-pay | Admitting: Cardiology

## 2013-05-02 ENCOUNTER — Encounter: Payer: Self-pay | Admitting: *Deleted

## 2013-07-24 ENCOUNTER — Ambulatory Visit (INDEPENDENT_AMBULATORY_CARE_PROVIDER_SITE_OTHER): Payer: Medicare Other | Admitting: *Deleted

## 2013-07-24 ENCOUNTER — Encounter: Payer: Self-pay | Admitting: Internal Medicine

## 2013-07-24 DIAGNOSIS — I429 Cardiomyopathy, unspecified: Secondary | ICD-10-CM

## 2013-07-24 DIAGNOSIS — I5022 Chronic systolic (congestive) heart failure: Secondary | ICD-10-CM

## 2013-07-24 LAB — MDC_IDC_ENUM_SESS_TYPE_REMOTE
Battery Remaining Longevity: 57 mo
Battery Remaining Percentage: 60 %
Brady Statistic AP VP Percent: 1 %
Brady Statistic AS VS Percent: 31 %
HighPow Impedance: 50 Ohm
Implantable Pulse Generator Serial Number: 593609
Lead Channel Impedance Value: 350 Ohm
Lead Channel Pacing Threshold Amplitude: 0.75 V
Lead Channel Sensing Intrinsic Amplitude: 11.7 mV
Lead Channel Sensing Intrinsic Amplitude: 3.6 mV
Lead Channel Setting Pacing Amplitude: 2 V
Lead Channel Setting Pacing Pulse Width: 0.8 ms
Lead Channel Setting Sensing Sensitivity: 0.5 mV

## 2013-07-25 ENCOUNTER — Ambulatory Visit: Payer: Medicare Other | Admitting: Cardiology

## 2013-07-26 ENCOUNTER — Encounter: Payer: Self-pay | Admitting: Cardiology

## 2013-07-26 ENCOUNTER — Ambulatory Visit (INDEPENDENT_AMBULATORY_CARE_PROVIDER_SITE_OTHER): Payer: Medicare Other | Admitting: Cardiology

## 2013-07-26 VITALS — BP 89/57 | HR 60 | Ht 71.0 in | Wt 207.1 lb

## 2013-07-26 DIAGNOSIS — E782 Mixed hyperlipidemia: Secondary | ICD-10-CM

## 2013-07-26 DIAGNOSIS — I2510 Atherosclerotic heart disease of native coronary artery without angina pectoris: Secondary | ICD-10-CM

## 2013-07-26 DIAGNOSIS — I429 Cardiomyopathy, unspecified: Secondary | ICD-10-CM

## 2013-07-26 NOTE — Patient Instructions (Signed)
Continue all current medications. Your physician wants you to follow up in: 6 months.  You will receive a reminder letter in the mail one-two months in advance.  If you don't receive a letter, please call our office to schedule the follow up appointment   

## 2013-07-26 NOTE — Progress Notes (Signed)
Clinical Summary [name] is a 67 y.o.male last seen in May. Interval device interrogation continues per Dr. Johney Frame. He reports no change in functional status, no significant leg edema on current Lasix dose. Blood pressure fluctuates a bit, sometimes on the low side like today. He has had no significant dizziness or syncope however.  Most recent echocardiogram from May 2013 revealed improvement in LV function with EF of 55-60%, diastolic dysfunction, RVSP 40-45 mm mercury.  Lab work from August showed LDL 45, HDL 49, triglycerides 151, cholesterol 124, BUN 16, creatinine 1.4, normal LFTs, potassium 4.4, hemoglobin 12.8, platelets 247.  Weight is stable overall.   Allergies  Allergen Reactions  . Penicillins     REACTION: hives    Current Outpatient Prescriptions  Medication Sig Dispense Refill  . amitriptyline (ELAVIL) 25 MG tablet       . aspirin 81 MG tablet Take 81 mg by mouth daily.      . Cholecalciferol (VITAMIN D3) 5000 UNITS CAPS Take 1 capsule by mouth daily.      . diazepam (VALIUM) 10 MG tablet Take 10 mg by mouth every 12 (twelve) hours as needed.       . dutasteride (AVODART) 0.5 MG capsule Take 0.5 mg by mouth daily.      Marland Kitchen esomeprazole (NEXIUM) 20 MG capsule Take 20 mg by mouth daily at 12 noon.      . fenofibrate 160 MG tablet Take 160 mg by mouth daily.        . fish oil-omega-3 fatty acids 1000 MG capsule Take 1 g by mouth every other day.       . furosemide (LASIX) 80 MG tablet Take 80 mg by mouth daily.      Marland Kitchen gabapentin (NEURONTIN) 300 MG capsule Take 600 mg by mouth as directed. 1 tablet three times a day and 2 at bedtime.      . metoprolol succinate (TOPROL-XL) 25 MG 24 hr tablet TAKE 1 TABLET BY MOUTH 2 TIMES DAILY  60 tablet  2  . oxyCODONE (OXY IR/ROXICODONE) 5 MG immediate release tablet Take 15 mg by mouth every 6 (six) hours as needed.      . OxyCODONE HCl ER 60 MG T12A Take 60 mg by mouth 2 (two) times daily.      . polyethylene glycol powder  (GLYCOLAX/MIRALAX) powder       . rosuvastatin (CRESTOR) 10 MG tablet Take 20 mg by mouth daily.       . tamsulosin (FLOMAX) 0.4 MG CAPS Take 0.4 mg by mouth daily after supper.      . temazepam (RESTORIL) 30 MG capsule Take 30 mg by mouth at bedtime as needed for sleep.      Marland Kitchen VIIBRYD 40 MG TABS Take 1 tablet by mouth Daily.       No current facility-administered medications for this visit.    Past Medical History  Diagnosis Date  . Essential hypertension, benign   . Mixed hyperlipidemia   . Cardiomyopathy     Mixed, LVEF 35% up to 50%  . H/O alcohol abuse   . Depression   . Obesity   . COPD (chronic obstructive pulmonary disease)   . Osteoarthritis   . DDD (degenerative disc disease)   . GERD (gastroesophageal reflux disease)   . ACTH deficiency     Dr. Lucianne Muss  . ICD (implantable cardiac defibrillator) in place   . Shortness of breath   . Sleep apnea     Noncompliance  with CPAP therapy  . Full dentures   . Wears glasses   . Coronary atherosclerosis of native coronary artery     Occluded RCA with good collaterals    Past Surgical History  Procedure Laterality Date  . Umbilical hernia repair    . Knee surgery    . Ankle surgery  right  . Femur surgery  right  . Nasal sinus surgery    . Icd implantation  06/18/2009    St Jude Fortify DR implanted by Dr. Johney Frame  . Transurethral resection of prostate  2013  . Back surgery      Multiple back surgeries for chronic back pain and lumbar fusion  . Foot arthrodesis Right 11/10/2012    Procedure: Right Hallux Metatarsal-phalangeal Joint Arthrodesis; Second Metatarsal Head Resection and Second, Third and Fourth Metatarsal Weil Osteotomy;  Surgeon: Toni Arthurs, MD;  Location: [facility];  Service: Orthopedics;  Laterality: Right;  . Hammer toe surgery Right 11/10/2012    Procedure: Right Second, Third, and Fourth Hammertoe Corrections;  Surgeon: Toni Arthurs, MD;  Location: [facility];  Service:  Orthopedics;  Laterality: Right;    Social History [name] reports that he quit smoking about 16 years ago. His smoking use included Cigarettes. He has a 36 pack-year smoking history. He has never used smokeless tobacco. [name] reports that he drinks alcohol.  Review of Systems Head no palpitations, angina. Stable appetite. Otherwise negative.  Physical Examination Filed Vitals:   07/26/13 1512  BP: 89/57  Pulse: 60   Filed Weights   07/26/13 1512  Weight: 207 lb 1.9 oz (93.949 kg)   Overweight male, comfortable at rest. HEENT: Conjunctiva and lids normal, oropharynx clear.  Neck: Supple, no elevated JVP, no thyromegaly.  Lungs: Decreased but clear to auscultation, nonlabored breathing at rest.  Cardiac: Regular rate and rhythm with frequent ectopy, no S3, no pericardial rub.  Abdomen: Obese, nontender, bowel sounds present, no guarding or rebound.  Extremities: Trace edema, distal pulses 1+.  Skin: Warm and dry.  Musculoskeletal: No kyphosis.  Neuropsychiatric: Alert and oriented x3, affect grossly appropriate.    Problem List and Plan   CARDIOMYOPATHY, SECONDARY Normalization of LVEF on medical therapy. Symptomatically stable at this time. Continues on Toprol-XL, no ARB or ACE inhibitor with fluctuating blood pressures.  CAD, NATIVE VESSEL No active angina symptoms, occluded RCA with collateral flow. Continue aspirin and statin therapy.  ICD-St.Jude Keep followup with Dr. Johney Frame.  Mixed hyperlipidemia LDL 45 in August.    Jonelle Sidle, M.D., F.A.C.C.

## 2013-07-26 NOTE — Assessment & Plan Note (Signed)
No active angina symptoms, occluded RCA with collateral flow. Continue aspirin and statin therapy.

## 2013-07-26 NOTE — Assessment & Plan Note (Signed)
LDL 45 in August.

## 2013-07-26 NOTE — Assessment & Plan Note (Signed)
Keep followup with Dr. Allred. 

## 2013-07-26 NOTE — Assessment & Plan Note (Signed)
Normalization of LVEF on medical therapy. Symptomatically stable at this time. Continues on Toprol-XL, no ARB or ACE inhibitor with fluctuating blood pressures.

## 2013-08-02 ENCOUNTER — Encounter: Payer: Self-pay | Admitting: *Deleted

## 2013-09-20 ENCOUNTER — Other Ambulatory Visit: Payer: Self-pay | Admitting: *Deleted

## 2013-09-20 MED ORDER — METOPROLOL SUCCINATE ER 25 MG PO TB24: 25.0000 mg | ORAL_TABLET | Freq: Two times a day (BID) | ORAL | Status: DC

## 2013-10-10 ENCOUNTER — Other Ambulatory Visit: Payer: Self-pay | Admitting: Cardiology

## 2013-10-10 NOTE — Telephone Encounter (Signed)
[facility] PT 

## 2013-10-11 NOTE — Telephone Encounter (Signed)
Called patient to inquire about the discrepancy with the directions for his toprol xl and he stated that he was informed by MD during his last office visit that it was okay to take 1 tablet daily. Please advise if this is okay.

## 2013-10-11 NOTE — Telephone Encounter (Signed)
OK to take Toprol XL 25 mg once daily.

## 2013-10-25 ENCOUNTER — Ambulatory Visit (INDEPENDENT_AMBULATORY_CARE_PROVIDER_SITE_OTHER): Payer: Medicare Other | Admitting: *Deleted

## 2013-10-25 DIAGNOSIS — I429 Cardiomyopathy, unspecified: Secondary | ICD-10-CM

## 2013-10-25 DIAGNOSIS — I5022 Chronic systolic (congestive) heart failure: Secondary | ICD-10-CM

## 2013-10-25 LAB — MDC_IDC_ENUM_SESS_TYPE_REMOTE
Battery Remaining Longevity: 55 mo
Battery Remaining Percentage: 57 %
Brady Statistic RA Percent Paced: 67 %
Brady Statistic RV Percent Paced: 1 %
HIGH POWER IMPEDANCE MEASURED VALUE: 46 Ohm
Lead Channel Impedance Value: 410 Ohm
Lead Channel Pacing Threshold Amplitude: 0.75 V
Lead Channel Sensing Intrinsic Amplitude: 3.3 mV
Lead Channel Setting Pacing Amplitude: 2 V
Lead Channel Setting Pacing Amplitude: 2.5 V
MDC IDC MSMT BATTERY VOLTAGE: 2.95 V
MDC IDC MSMT LEADCHNL RA IMPEDANCE VALUE: 360 Ohm
MDC IDC MSMT LEADCHNL RA PACING THRESHOLD PULSEWIDTH: 0.5 ms
MDC IDC MSMT LEADCHNL RV PACING THRESHOLD AMPLITUDE: 1.25 V
MDC IDC MSMT LEADCHNL RV PACING THRESHOLD PULSEWIDTH: 0.8 ms
MDC IDC MSMT LEADCHNL RV SENSING INTR AMPL: 11.7 mV
MDC IDC PG SERIAL: 593609
MDC IDC SESS DTM: 20150225104635
MDC IDC SET LEADCHNL RV PACING PULSEWIDTH: 0.8 ms
MDC IDC SET LEADCHNL RV SENSING SENSITIVITY: 0.5 mV
MDC IDC STAT BRADY AP VP PERCENT: 1 %
MDC IDC STAT BRADY AP VS PERCENT: 69 %
MDC IDC STAT BRADY AS VP PERCENT: 1 %
MDC IDC STAT BRADY AS VS PERCENT: 31 %
Zone Setting Detection Interval: 300 ms
Zone Setting Detection Interval: 355 ms

## 2013-10-27 ENCOUNTER — Encounter: Payer: Self-pay | Admitting: Internal Medicine

## 2013-10-30 ENCOUNTER — Encounter: Payer: Self-pay | Admitting: *Deleted

## 2014-01-15 ENCOUNTER — Encounter: Payer: Self-pay | Admitting: Cardiology

## 2014-01-15 ENCOUNTER — Ambulatory Visit (INDEPENDENT_AMBULATORY_CARE_PROVIDER_SITE_OTHER): Payer: Medicare HMO | Admitting: Cardiology

## 2014-01-15 VITALS — BP 97/64 | HR 60 | Ht 70.0 in | Wt 200.4 lb

## 2014-01-15 DIAGNOSIS — E782 Mixed hyperlipidemia: Secondary | ICD-10-CM

## 2014-01-15 DIAGNOSIS — I429 Cardiomyopathy, unspecified: Secondary | ICD-10-CM

## 2014-01-15 DIAGNOSIS — I2510 Atherosclerotic heart disease of native coronary artery without angina pectoris: Secondary | ICD-10-CM

## 2014-01-15 NOTE — Progress Notes (Signed)
Clinical Summary [name] is a 68 y.o.male last seen in November 2014. He reports no angina symptoms on medical therapy. Functional with his ADLs. Has occasional trouble with leg edema, takes diuretics. We discussed sodium and fluid intake.  Most recent echocardiogram from May 2013 revealed improvement in LV function with EF of 55-60%, diastolic dysfunction, RVSP 40-45 mm mercury.  He continues to followup with Dr. Charm Barges.   Allergies  Allergen Reactions  . Penicillins     REACTION: hives    Current Outpatient Prescriptions  Medication Sig Dispense Refill  . aspirin 81 MG tablet Take 81 mg by mouth daily.      . Cholecalciferol (VITAMIN D3) 5000 UNITS CAPS Take 1 capsule by mouth daily.      . diazepam (VALIUM) 10 MG tablet Take 10 mg by mouth every 12 (twelve) hours as needed.       . fenofibrate 160 MG tablet Take 160 mg by mouth daily.        . fentaNYL (DURAGESIC - DOSED MCG/HR) 100 MCG/HR Place 100 mcg onto the skin every 3 (three) days.      . furosemide (LASIX) 80 MG tablet Take 80 mg by mouth daily.      Marland Kitchen gabapentin (NEURONTIN) 300 MG capsule Take 600 mg by mouth as directed. 1 tablet three times a day and 2 at bedtime.      . metoprolol succinate (TOPROL-XL) 25 MG 24 hr tablet Take 1 tablet (25 mg total) by mouth daily.  30 tablet  6  . oxyCODONE (ROXICODONE) 15 MG immediate release tablet Take 15 mg by mouth every 5 (five) hours as needed for pain.      Marland Kitchen oxymorphone (OPANA ER) 40 MG 12 hr tablet Take 40 mg by mouth 2 (two) times daily.      . polyethylene glycol powder (GLYCOLAX/MIRALAX) powder       . rosuvastatin (CRESTOR) 10 MG tablet Take 20 mg by mouth daily.       . tamsulosin (FLOMAX) 0.4 MG CAPS Take 0.4 mg by mouth daily after supper.      . temazepam (RESTORIL) 30 MG capsule Take 30 mg by mouth at bedtime as needed for sleep.      Marland Kitchen VIIBRYD 40 MG TABS Take 1 tablet by mouth Daily.       No current facility-administered medications for this visit.     Past Medical History  Diagnosis Date  . Essential hypertension, benign   . Mixed hyperlipidemia   . Cardiomyopathy     Mixed, LVEF 35% up to 50%  . H/O alcohol abuse   . Depression   . Obesity   . COPD (chronic obstructive pulmonary disease)   . Osteoarthritis   . DDD (degenerative disc disease)   . GERD (gastroesophageal reflux disease)   . ACTH deficiency     Dr. Lucianne Muss  . ICD (implantable cardiac defibrillator) in place   . Shortness of breath   . Sleep apnea     Noncompliance with CPAP therapy  . Full dentures   . Wears glasses   . Coronary atherosclerosis of native coronary artery     Occluded RCA with good collaterals    Social History [name] reports that he quit smoking about 16 years ago. His smoking use included Cigarettes. He has a 36 pack-year smoking history. He has never used smokeless tobacco. [name] reports that he drinks alcohol.  Review of Systems No palpitations, dizziness, syncope.  Physical Examination Filed  Vitals:   01/15/14 1455  BP: 97/64  Pulse: 60   Filed Weights   01/15/14 1455  Weight: 200 lb 6.4 oz (90.901 kg)    Overweight male, comfortable at rest.  HEENT: Conjunctiva and lids normal, oropharynx clear.  Neck: Supple, no elevated JVP, no thyromegaly.  Lungs: Decreased but clear to auscultation, nonlabored breathing at rest.  Cardiac: Regular rate and rhythm with frequent ectopy, no S3, no pericardial rub.  Abdomen: Obese, nontender, bowel sounds present, no guarding or rebound.  Extremities: Trace to 1+ edema, distal pulses 1+.  Skin: Warm and dry.  Musculoskeletal: No kyphosis.  Neuropsychiatric: Alert and oriented x3, affect grossly appropriate.    Problem List and Plan   CAD, NATIVE VESSEL Symptomatically stable, known occlusion of the RCA with good collaterals. Continue medical therapy and observation.  CARDIOMYOPATHY, SECONDARY Normalization of LVEF on medical therapy.  Mixed hyperlipidemia Continues on  fenofibrate, followed by Dr. Charm BargesButler.  ICD-St.Jude Keep follow up in the device clinic.    Jonelle SidleSamuel G. Adilynne Fitzwater, M.D., F.A.C.C.

## 2014-01-15 NOTE — Assessment & Plan Note (Signed)
Continues on fenofibrate, followed by Dr. Charm Barges.

## 2014-01-15 NOTE — Patient Instructions (Signed)

## 2014-01-15 NOTE — Assessment & Plan Note (Signed)
Symptomatically stable, known occlusion of the RCA with good collaterals. Continue medical therapy and observation.

## 2014-01-15 NOTE — Assessment & Plan Note (Signed)
Normalization of LVEF on medical therapy.

## 2014-01-15 NOTE — Assessment & Plan Note (Signed)
Keep followup in the device clinic. 

## 2014-01-26 ENCOUNTER — Encounter: Payer: Self-pay | Admitting: Internal Medicine

## 2014-01-26 ENCOUNTER — Ambulatory Visit (INDEPENDENT_AMBULATORY_CARE_PROVIDER_SITE_OTHER): Payer: Medicare HMO | Admitting: Internal Medicine

## 2014-01-26 VITALS — BP 99/67 | HR 64 | Ht 70.0 in | Wt 201.8 lb

## 2014-01-26 DIAGNOSIS — Z95810 Presence of automatic (implantable) cardiac defibrillator: Secondary | ICD-10-CM

## 2014-01-26 DIAGNOSIS — I5022 Chronic systolic (congestive) heart failure: Secondary | ICD-10-CM

## 2014-01-26 DIAGNOSIS — I429 Cardiomyopathy, unspecified: Secondary | ICD-10-CM

## 2014-01-26 LAB — MDC_IDC_ENUM_SESS_TYPE_INCLINIC
Battery Remaining Longevity: 56.4 mo
Brady Statistic RA Percent Paced: 68 %
Date Time Interrogation Session: 20150529094602
HIGH POWER IMPEDANCE MEASURED VALUE: 58.1652
Implantable Pulse Generator Serial Number: 593609
Lead Channel Pacing Threshold Amplitude: 0.75 V
Lead Channel Pacing Threshold Amplitude: 1.5 V
Lead Channel Sensing Intrinsic Amplitude: 3.3 mV
Lead Channel Setting Sensing Sensitivity: 0.5 mV
MDC IDC MSMT LEADCHNL RA IMPEDANCE VALUE: 312.5 Ohm
MDC IDC MSMT LEADCHNL RA PACING THRESHOLD PULSEWIDTH: 0.5 ms
MDC IDC MSMT LEADCHNL RV IMPEDANCE VALUE: 400 Ohm
MDC IDC MSMT LEADCHNL RV PACING THRESHOLD PULSEWIDTH: 0.8 ms
MDC IDC MSMT LEADCHNL RV SENSING INTR AMPL: 11.7 mV
MDC IDC SET LEADCHNL RA PACING AMPLITUDE: 2 V
MDC IDC SET LEADCHNL RV PACING AMPLITUDE: 3 V
MDC IDC SET LEADCHNL RV PACING PULSEWIDTH: 0.8 ms
MDC IDC SET ZONE DETECTION INTERVAL: 300 ms
MDC IDC STAT BRADY RV PERCENT PACED: 0.3 %
Zone Setting Detection Interval: 355 ms

## 2014-01-26 NOTE — Patient Instructions (Signed)
Your physician recommends that you schedule a follow-up appointment in: 1 year with Dr. Allred. You will receive a reminder letter in the mail in about 10 months reminding you to call and schedule your appointment. If you don't receive this letter, please contact our office. Your next device check with Merlin is May 01, 2014. Your physician recommends that you continue on your current medications as directed. Please refer to the Current Medication list given to you today. 

## 2014-01-26 NOTE — Progress Notes (Signed)
PCP:  Samuel JesterBUTLER, CYNTHIA, DO Primary Cardiologist: Dr Diona BrownerMcDowell  The patient presents today for routine electrophysiology followup.  Since last being seen in our clinic, the patient reports doing very well.  Today, he denies symptoms of palpitations, chest pain, shortness of breath, orthopnea, PND, dizziness, presyncope, syncope, or ICD shocks.  He has stable mild edema. The patient feels that he is tolerating medications without difficulties and is otherwise without complaint today.   Past Medical History  Diagnosis Date  . Essential hypertension, benign   . Mixed hyperlipidemia   . Cardiomyopathy     Mixed, LVEF 35% up to 50%  . H/O alcohol abuse   . Depression   . Obesity   . COPD (chronic obstructive pulmonary disease)   . Osteoarthritis   . DDD (degenerative disc disease)   . GERD (gastroesophageal reflux disease)   . ACTH deficiency     Dr. Lucianne MussKumar  . ICD (implantable cardiac defibrillator) in place   . Shortness of breath   . Sleep apnea     Noncompliance with CPAP therapy  . Full dentures   . Wears glasses   . Coronary atherosclerosis of native coronary artery     Occluded RCA with good collaterals   Past Surgical History  Procedure Laterality Date  . Umbilical hernia repair    . Knee surgery    . Ankle surgery  right  . Femur surgery  right  . Nasal sinus surgery    . Icd implantation  06/18/2009    St Jude Fortify DR implanted by Dr. Johney FrameAllred  . Transurethral resection of prostate  2013  . Back surgery      Multiple back surgeries for chronic back pain and lumbar fusion  . Foot arthrodesis Right 11/10/2012    Procedure: Right Hallux Metatarsal-phalangeal Joint Arthrodesis; Second Metatarsal Head Resection and Second, Third and Fourth Metatarsal Weil Osteotomy;  Surgeon: Toni ArthursJohn Hewitt, MD;  Location: [facility];  Service: Orthopedics;  Laterality: Right;  . Hammer toe surgery Right 11/10/2012    Procedure: Right Second, Third, and Fourth Hammertoe Corrections;   Surgeon: Toni ArthursJohn Hewitt, MD;  Location: [facility];  Service: Orthopedics;  Laterality: Right;    Current Outpatient Prescriptions  Medication Sig Dispense Refill  . aspirin 81 MG tablet Take 81 mg by mouth daily.      . Cholecalciferol (VITAMIN D3) 5000 UNITS CAPS Take 1 capsule by mouth daily.      . diazepam (VALIUM) 10 MG tablet Take 10 mg by mouth every 12 (twelve) hours as needed.       . fenofibrate 160 MG tablet Take 160 mg by mouth daily.        . fentaNYL (DURAGESIC - DOSED MCG/HR) 100 MCG/HR Place 100 mcg onto the skin every 3 (three) days.      . furosemide (LASIX) 80 MG tablet Take 80 mg by mouth daily.      Marland Kitchen. gabapentin (NEURONTIN) 300 MG capsule Take 600 mg by mouth as directed. 1 tablet three times a day and 2 at bedtime.      . metoprolol succinate (TOPROL-XL) 25 MG 24 hr tablet Take 1 tablet (25 mg total) by mouth daily.  30 tablet  6  . oxyCODONE (ROXICODONE) 15 MG immediate release tablet Take 15 mg by mouth every 5 (five) hours as needed for pain.      Marland Kitchen. oxymorphone (OPANA ER) 40 MG 12 hr tablet Take 40 mg by mouth 2 (two) times daily.      .Marland Kitchen  polyethylene glycol powder (GLYCOLAX/MIRALAX) powder       . rosuvastatin (CRESTOR) 10 MG tablet Take 20 mg by mouth daily.       . tamsulosin (FLOMAX) 0.4 MG CAPS Take 0.4 mg by mouth daily after supper.      . temazepam (RESTORIL) 30 MG capsule Take 30 mg by mouth at bedtime as needed for sleep.      Marland Kitchen VIIBRYD 40 MG TABS Take 1 tablet by mouth Daily.       No current facility-administered medications for this visit.    Allergies  Allergen Reactions  . Penicillins     REACTION: hives    History   Social History  . Marital Status: Divorced    Spouse Name: N/A    Number of Children: N/A  . Years of Education: N/A   Occupational History  . Not on file.   Social History Main Topics  . Smoking status: Former Smoker -- 1.00 packs/day for 36 years    Types: Cigarettes    Quit date: 05/26/1997  . Smokeless  tobacco: Never Used  . Alcohol Use: Yes     Comment: History of abuse. Still admits to occasional binges  . Drug Use: No  . Sexual Activity: Not on file   Other Topics Concern  . Not on file   Social History Narrative  . No narrative on file    Family History  Problem Relation Age of Onset  . Coronary artery disease    . Arthritis      Physical Exam: Filed Vitals:   01/26/14 0920  BP: 99/67  Pulse: 64  Height: 5\' 10"  (1.778 m)  Weight: 201 lb 12.8 oz (91.536 kg)    GEN- The patient is well appearing, alert and oriented x 3 today.   Head- normocephalic, atraumatic Eyes-  Sclera clear, conjunctiva pink Ears- hearing intact Oropharynx- clear Neck- supple, no JVP Lymph- no cervical lymphadenopathy Lungs- Clear to ausculation bilaterally, normal work of breathing Chest- ICD pocket is well healed Heart- Regular rate and rhythm, no murmurs, rubs or gallops, PMI not laterally displaced GI- soft, NT, ND, + BS Extremities- no clubbing, cyanosis, or edema  ICD interrogation- reviewed in detail today,  See PACEART report  Assessment and Plan:  1. 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  Will refer to Sherri Rad for ICM follow-up ETOH avoidance Normal ICD function See Pace Art report  + [facility] follow-up Return to the device clinic in a year

## 2014-03-08 ENCOUNTER — Encounter: Payer: Self-pay | Admitting: *Deleted

## 2014-04-09 ENCOUNTER — Encounter: Payer: Medicare HMO | Admitting: *Deleted

## 2014-05-01 ENCOUNTER — Ambulatory Visit (INDEPENDENT_AMBULATORY_CARE_PROVIDER_SITE_OTHER): Payer: Medicare HMO | Admitting: *Deleted

## 2014-05-01 ENCOUNTER — Telehealth: Payer: Self-pay | Admitting: Cardiology

## 2014-05-01 NOTE — Telephone Encounter (Signed)
Spoke with pt and reminded pt of remote transmission that is due today. Pt verbalized understanding.   

## 2014-05-02 ENCOUNTER — Encounter: Payer: Self-pay | Admitting: Cardiology

## 2014-05-09 ENCOUNTER — Other Ambulatory Visit: Payer: Self-pay | Admitting: Internal Medicine

## 2014-05-09 DIAGNOSIS — I5022 Chronic systolic (congestive) heart failure: Secondary | ICD-10-CM

## 2014-05-09 DIAGNOSIS — I429 Cardiomyopathy, unspecified: Secondary | ICD-10-CM

## 2014-05-09 NOTE — Progress Notes (Signed)
Remote ICD transmission.   

## 2014-05-11 ENCOUNTER — Other Ambulatory Visit: Payer: Self-pay | Admitting: Cardiology

## 2014-05-11 NOTE — Telephone Encounter (Signed)
[place] pt. 

## 2014-05-12 LAB — MDC_IDC_ENUM_SESS_TYPE_REMOTE
HighPow Impedance: 57 Ohm
Implantable Pulse Generator Serial Number: 593609
Lead Channel Impedance Value: 350 Ohm
Lead Channel Impedance Value: 400 Ohm
Lead Channel Sensing Intrinsic Amplitude: 2.9 mV
Lead Channel Setting Pacing Amplitude: 3 V
Lead Channel Setting Pacing Pulse Width: 0.8 ms
Lead Channel Setting Sensing Sensitivity: 0.5 mV
MDC IDC MSMT BATTERY REMAINING LONGEVITY: 36 mo
MDC IDC MSMT LEADCHNL RV SENSING INTR AMPL: 11.7 mV
MDC IDC SET LEADCHNL RA PACING AMPLITUDE: 2 V
MDC IDC STAT BRADY RA PERCENT PACED: 73 %
MDC IDC STAT BRADY RV PERCENT PACED: 5.4 %
Zone Setting Detection Interval: 300 ms
Zone Setting Detection Interval: 355 ms

## 2014-05-14 ENCOUNTER — Encounter: Payer: Self-pay | Admitting: Cardiology

## 2014-05-15 ENCOUNTER — Encounter: Payer: Self-pay | Admitting: Internal Medicine

## 2014-08-13 ENCOUNTER — Encounter: Payer: Medicare HMO | Admitting: *Deleted

## 2014-08-13 ENCOUNTER — Telehealth: Payer: Self-pay | Admitting: Cardiology

## 2014-08-13 NOTE — Telephone Encounter (Signed)
Spoke with pt and reminded pt of remote transmission that is due today. Pt verbalized understanding.   

## 2014-08-17 ENCOUNTER — Encounter: Payer: Self-pay | Admitting: Cardiology

## 2014-09-04 ENCOUNTER — Ambulatory Visit (INDEPENDENT_AMBULATORY_CARE_PROVIDER_SITE_OTHER): Payer: PRIVATE HEALTH INSURANCE | Admitting: *Deleted

## 2014-09-04 DIAGNOSIS — Z95810 Presence of automatic (implantable) cardiac defibrillator: Secondary | ICD-10-CM

## 2014-09-04 DIAGNOSIS — I5022 Chronic systolic (congestive) heart failure: Secondary | ICD-10-CM

## 2014-09-04 LAB — MDC_IDC_ENUM_SESS_TYPE_REMOTE
Brady Statistic RA Percent Paced: 71 %
HighPow Impedance: 54 Ohm
Lead Channel Impedance Value: 400 Ohm
Lead Channel Sensing Intrinsic Amplitude: 11.7 mV
Lead Channel Sensing Intrinsic Amplitude: 3.1 mV
Lead Channel Setting Pacing Amplitude: 2 V
Lead Channel Setting Pacing Amplitude: 3 V
Lead Channel Setting Sensing Sensitivity: 0.5 mV
MDC IDC MSMT LEADCHNL RA IMPEDANCE VALUE: 300 Ohm
MDC IDC PG SERIAL: 593609
MDC IDC SET LEADCHNL RV PACING PULSEWIDTH: 0.8 ms
MDC IDC STAT BRADY RV PERCENT PACED: 7.8 %
Zone Setting Detection Interval: 300 ms
Zone Setting Detection Interval: 355 ms

## 2014-09-05 NOTE — Progress Notes (Signed)
Remote ICD transmission.   

## 2014-09-14 ENCOUNTER — Encounter: Payer: Self-pay | Admitting: Cardiology

## 2014-09-19 ENCOUNTER — Encounter: Payer: Self-pay | Admitting: Internal Medicine

## 2014-10-25 ENCOUNTER — Telehealth: Payer: Self-pay | Admitting: Cardiology

## 2014-10-25 NOTE — Telephone Encounter (Signed)
Pt will call me back later today

## 2014-11-07 ENCOUNTER — Telehealth: Payer: Self-pay | Admitting: Cardiology

## 2014-11-07 NOTE — Telephone Encounter (Signed)
Pt enrolled in [facility]. Pt 1st transmission scheduled for 12-06-14.

## 2014-11-14 ENCOUNTER — Ambulatory Visit (INDEPENDENT_AMBULATORY_CARE_PROVIDER_SITE_OTHER): Payer: PRIVATE HEALTH INSURANCE | Admitting: Cardiology

## 2014-11-14 ENCOUNTER — Encounter: Payer: Self-pay | Admitting: Cardiology

## 2014-11-14 VITALS — BP 116/74 | HR 62 | Ht 69.0 in | Wt 194.1 lb

## 2014-11-14 DIAGNOSIS — I10 Essential (primary) hypertension: Secondary | ICD-10-CM | POA: Diagnosis not present

## 2014-11-14 DIAGNOSIS — Z95810 Presence of automatic (implantable) cardiac defibrillator: Secondary | ICD-10-CM | POA: Diagnosis not present

## 2014-11-14 DIAGNOSIS — I2510 Atherosclerotic heart disease of native coronary artery without angina pectoris: Secondary | ICD-10-CM

## 2014-11-14 DIAGNOSIS — I429 Cardiomyopathy, unspecified: Secondary | ICD-10-CM

## 2014-11-14 NOTE — Patient Instructions (Signed)
Your physician recommends that you schedule a follow-up appointment in: 6 months. You will receive a reminder letter in the mail in about 4 months reminding you to call and schedule your appointment. If you don't receive this letter, please contact our office. Your physician recommends that you continue on your current medications as directed. Please refer to the Current Medication list given to you today. Your physician has requested that you have an echocardiogram. Echocardiography is a painless test that uses sound waves to create images of your heart. It provides your doctor with information about the size and shape of your heart and how well your heart's chambers and valves are working. This procedure takes approximately one hour. There are no restrictions for this procedure.  

## 2014-11-14 NOTE — Progress Notes (Signed)
Cardiology Office Note  Date: 11/14/2014   ID: Maddock, Finigan 11-08-1945, MRN [id number]  PCP: Samuel Jester, DO  Primary Cardiologist: Nona Dell, MD   Chief Complaint  Patient presents with  . Coronary Artery Disease  . Cardiomyopathy  . Hypertension    History of Present Illness: [name] is a 69 y.o. male last seen in May 2015. He continues with interval follow-up in the device clinic. He reports stable NYHA class II dyspnea, no chest pain, palpitations, or device discharges.  We reviewed his cardiac medications, he continues on Toprol-XL, Crestor, Lasix, and aspirin.  He denies any significant leg edema, his weight is down compared to the last encounter. Echocardiogram from 2013 showed LVEF in the 55-60% range. We did discuss a follow-up study.   Past Medical History  Diagnosis Date  . Essential hypertension, benign   . Mixed hyperlipidemia   . Cardiomyopathy     Mixed, LVEF 35% up to 50%  . H/O alcohol abuse   . Depression   . Obesity   . COPD (chronic obstructive pulmonary disease)   . Osteoarthritis   . DDD (degenerative disc disease)   . GERD (gastroesophageal reflux disease)   . ACTH deficiency     Dr. Lucianne Muss  . ICD (implantable cardiac defibrillator) in place   . Shortness of breath   . Sleep apnea     Noncompliance with CPAP therapy  . Full dentures   . Wears glasses   . Coronary atherosclerosis of native coronary artery     Occluded RCA with good collaterals    Past Surgical History  Procedure Laterality Date  . Umbilical hernia repair    . Knee surgery    . Ankle surgery  right  . Femur surgery  right  . Nasal sinus surgery    . Icd implantation  06/18/2009    St Jude Fortify DR implanted by Dr. Johney Frame  . Transurethral resection of prostate  2013  . Back surgery      Multiple back surgeries for chronic back pain and lumbar fusion  . Foot arthrodesis Right 11/10/2012    Procedure: Right Hallux Metatarsal-phalangeal Joint  Arthrodesis; Second Metatarsal Head Resection and Second, Third and Fourth Metatarsal Weil Osteotomy;  Surgeon: Toni Arthurs, MD;  Location: [facility];  Service: Orthopedics;  Laterality: Right;  . Hammer toe surgery Right 11/10/2012    Procedure: Right Second, Third, and Fourth Hammertoe Corrections;  Surgeon: Toni Arthurs, MD;  Location: [facility];  Service: Orthopedics;  Laterality: Right;    Current Outpatient Prescriptions  Medication Sig Dispense Refill  . aspirin 81 MG tablet Take 81 mg by mouth daily.    . Cholecalciferol (VITAMIN D3) 5000 UNITS CAPS Take 1 capsule by mouth daily.    . diazepam (VALIUM) 10 MG tablet Take 10 mg by mouth every 12 (twelve) hours as needed.     . fenofibrate 160 MG tablet Take 160 mg by mouth daily.      . furosemide (LASIX) 80 MG tablet Take 80 mg by mouth daily.    Marland Kitchen gabapentin (NEURONTIN) 300 MG capsule Take 600 mg by mouth as directed. 1 tablet three times a day and 2 at bedtime.    . metoprolol succinate (TOPROL-XL) 25 MG 24 hr tablet TAKE 1 TABLET BY MOUTH EVERY DAY 30 tablet 6  . oxymorphone (OPANA ER) 40 MG 12 hr tablet Take 40 mg by mouth 2 (two) times daily.    Marland Kitchen  rosuvastatin (CRESTOR) 10 MG tablet Take 20 mg by mouth daily.     . tamsulosin (FLOMAX) 0.4 MG CAPS Take 0.4 mg by mouth daily after supper.    . temazepam (RESTORIL) 30 MG capsule Take 30 mg by mouth at bedtime as needed for sleep.    Marland Kitchen VIIBRYD 40 MG TABS Take 1 tablet by mouth Daily.     No current facility-administered medications for this visit.    Allergies:  Penicillins   Social History: The patient  reports that he quit smoking about 17 years ago. His smoking use included Cigarettes. He has a 36 pack-year smoking history. He has never used smokeless tobacco. He reports that he drinks alcohol. He reports that he does not use illicit drugs.   ROS:  Please see the history of present illness. Otherwise, complete review of systems is positive for  erectile dysfunction.  All other systems are reviewed and negative.    Physical Exam: VS:  BP 116/74 mmHg  Pulse 62  Ht 5\' 9"  (1.753 m)  Wt 194 lb 1.9 oz (88.052 kg)  BMI 28.65 kg/m2  SpO2 99%, BMI Body mass index is 28.65 kg/(m^2).  Wt Readings from Last 3 Encounters:  11/14/14 194 lb 1.9 oz (88.052 kg)  01/26/14 201 lb 12.8 oz (91.536 kg)  01/15/14 200 lb 6.4 oz (90.901 kg)     Overweight male, comfortable at rest.  HEENT: Conjunctiva and lids normal, oropharynx clear.  Neck: Supple, no elevated JVP, no thyromegaly.  Lungs: Decreased but clear to auscultation, nonlabored breathing at rest.  Cardiac: Regular rate and rhythm with frequent ectopy, no S3, no pericardial rub.  Abdomen: Obese, nontender, bowel sounds present, no guarding or rebound.  Extremities: Trace to 1+ edema, distal pulses 1+.  Skin: Warm and dry.  Musculoskeletal: No kyphosis.  Neuropsychiatric: Alert and oriented x3, affect grossly appropriate.   ECG: ECG is ordered today and reviewed showing an atrial paced rhythm.   Other Studies Reviewed Today:  Most recent echocardiogram from May 2013 revealed improvement in LV function with EF of 55-60%, diastolic dysfunction, RVSP 40-45 mm mercury.  Assessment and Plan:  1. Secondary cardiomyopathy, overall mixed picture with nonischemic component, LVEF as low as 35% of the past. Last echocardiogram in 2013 showed normalization of function. Follow-up study will be obtained. Otherwise continue present medical regimen.  2. Essential hypertension, blood pressure is normal today.  3. St. Jude ICD, followed by Dr. Johney Frame. No device discharges.  4. Known CAD with occluded RCA associate with collaterals. No active angina. Continue medical therapy.  Current medicines are reviewed at length with the patient today.  The patient does not have concerns regarding medicines.   Orders Placed This Encounter  Procedures  . EKG 12-Lead    Disposition: FU with me  in 6 months.   Signed, Jonelle Sidle, MD, [facility] 11/14/2014 2:02 PM    [facility] [address] [place], [place], [place] [address] Phone: [phone]; Fax: [phone]

## 2014-11-22 ENCOUNTER — Other Ambulatory Visit (INDEPENDENT_AMBULATORY_CARE_PROVIDER_SITE_OTHER): Payer: PRIVATE HEALTH INSURANCE

## 2014-11-22 ENCOUNTER — Other Ambulatory Visit: Payer: Self-pay

## 2014-11-22 DIAGNOSIS — I429 Cardiomyopathy, unspecified: Secondary | ICD-10-CM

## 2014-11-22 DIAGNOSIS — I2510 Atherosclerotic heart disease of native coronary artery without angina pectoris: Secondary | ICD-10-CM | POA: Diagnosis not present

## 2014-11-23 ENCOUNTER — Telehealth: Payer: Self-pay | Admitting: *Deleted

## 2014-11-23 NOTE — Telephone Encounter (Signed)
-----   Message from Jonelle Sidle, MD sent at 11/22/2014  5:13 PM EDT ----- Reviewed report. Please let him know that LV systolic function remains normal range on medical therapy. We will continue to follow over time.

## 2014-11-23 NOTE — Telephone Encounter (Signed)
Patient informed. 

## 2014-12-06 ENCOUNTER — Ambulatory Visit (INDEPENDENT_AMBULATORY_CARE_PROVIDER_SITE_OTHER): Payer: PRIVATE HEALTH INSURANCE | Admitting: *Deleted

## 2014-12-06 DIAGNOSIS — I429 Cardiomyopathy, unspecified: Secondary | ICD-10-CM

## 2014-12-06 DIAGNOSIS — I5022 Chronic systolic (congestive) heart failure: Secondary | ICD-10-CM | POA: Diagnosis not present

## 2014-12-06 DIAGNOSIS — Z95810 Presence of automatic (implantable) cardiac defibrillator: Secondary | ICD-10-CM

## 2014-12-06 LAB — MDC_IDC_ENUM_SESS_TYPE_REMOTE
Battery Remaining Longevity: 38 mo
Battery Remaining Percentage: 46 %
Brady Statistic AS VP Percent: 1.6 %
Brady Statistic AS VS Percent: 31 %
Brady Statistic RA Percent Paced: 65 %
Brady Statistic RV Percent Paced: 6.8 %
Date Time Interrogation Session: 20160407111310
HighPow Impedance: 55 Ohm
Implantable Pulse Generator Serial Number: 593609
Lead Channel Impedance Value: 440 Ohm
Lead Channel Pacing Threshold Amplitude: 0.75 V
Lead Channel Pacing Threshold Pulse Width: 0.5 ms
Lead Channel Sensing Intrinsic Amplitude: 3.1 mV
Lead Channel Setting Pacing Amplitude: 3 V
Lead Channel Setting Sensing Sensitivity: 0.5 mV
MDC IDC MSMT BATTERY VOLTAGE: 2.92 V
MDC IDC MSMT LEADCHNL RA IMPEDANCE VALUE: 290 Ohm
MDC IDC MSMT LEADCHNL RV PACING THRESHOLD AMPLITUDE: 1.5 V
MDC IDC MSMT LEADCHNL RV PACING THRESHOLD PULSEWIDTH: 0.8 ms
MDC IDC MSMT LEADCHNL RV SENSING INTR AMPL: 11.7 mV
MDC IDC SET LEADCHNL RA PACING AMPLITUDE: 2 V
MDC IDC SET LEADCHNL RV PACING PULSEWIDTH: 0.8 ms
MDC IDC SET ZONE DETECTION INTERVAL: 300 ms
MDC IDC STAT BRADY AP VP PERCENT: 2.4 %
MDC IDC STAT BRADY AP VS PERCENT: 65 %
Zone Setting Detection Interval: 355 ms

## 2014-12-06 NOTE — Progress Notes (Signed)
Remote ICD transmission.   

## 2014-12-07 ENCOUNTER — Other Ambulatory Visit: Payer: Self-pay | Admitting: Cardiology

## 2014-12-10 ENCOUNTER — Telehealth: Payer: Self-pay | Admitting: *Deleted

## 2014-12-10 NOTE — Telephone Encounter (Signed)
ICM transmission received. I left a message for the patient to call on his cell #. He is not available at the home # per the person answering the phone.

## 2014-12-11 ENCOUNTER — Encounter: Payer: Self-pay | Admitting: *Deleted

## 2014-12-11 NOTE — Telephone Encounter (Signed)
I spoke with the patient. 

## 2014-12-11 NOTE — Addendum Note (Signed)
Addended by: Sherri Rad C on: 12/11/2014 05:06 PM   Modules accepted: Level of Service

## 2014-12-11 NOTE — Progress Notes (Signed)
EPIC Encounter for ICM Monitoring  Patient Name: [name] is a 69 y.o. male Date: 12/11/2014 Primary Care Physican: Samuel Jester, DO Primary Cardiologist: Diona Browner Electrophysiologist: Allred Dry Weight: 189 lbs       In the past month, have you:  1. Gained more than 2 pounds in a day or more than 5 pounds in a week? The patient states that he has seen in weight up in the 193-198 lb range at it's highest over the last month.  2. Had changes in your medications (with verification of current medications)? no  3. Had more shortness of breath than is usual for you? No. He will typically have some SOB. He reports a history of COPD. He will occasionally use an inhaler. He has noticed some change in his breathing with the pollen coming out.   4. Limited your activity because of shortness of breath? No  5. Not been able to sleep because of shortness of breath? no  6. Had increased swelling in your feet or ankles? No. He will occasionally have some abdominal fullness not related to eating. This will resolve over night.   7. Had symptoms of dehydration (dizziness, dry mouth, increased thirst, decreased urine output) no  8. Had changes in sodium restriction? no  9. Been compliant with medication? Yes   ICM trend:   Follow-up plan: ICM clinic phone appointment: 01/14/15. This is my first ICM encounter with the patient. His corvue readings were slightly elevated from ~ 3/5-3/17. He does not report any major change in his symptoms. He will take an extra 1/2 tablet of lasix daily PRN if he feels that he is retaining fluid. He has done this approximately twice over the last 2 weeks. He does have some prostate issues and constipation that may contribute to some weight gain for him. Corvue readings are at baseline today therefore no changes have been made.   Copy of note sent to patient's primary care physician, primary cardiologist, and device following physician.  Sherri Rad, RN,  BSN 12/11/2014 4:57 PM

## 2014-12-17 ENCOUNTER — Encounter: Payer: Self-pay | Admitting: Cardiology

## 2014-12-19 ENCOUNTER — Encounter: Payer: Self-pay | Admitting: Internal Medicine

## 2015-01-14 ENCOUNTER — Telehealth: Payer: Self-pay | Admitting: *Deleted

## 2015-01-14 ENCOUNTER — Ambulatory Visit (INDEPENDENT_AMBULATORY_CARE_PROVIDER_SITE_OTHER): Payer: PRIVATE HEALTH INSURANCE | Admitting: *Deleted

## 2015-01-14 DIAGNOSIS — I5022 Chronic systolic (congestive) heart failure: Secondary | ICD-10-CM

## 2015-01-14 DIAGNOSIS — Z95810 Presence of automatic (implantable) cardiac defibrillator: Secondary | ICD-10-CM

## 2015-01-14 NOTE — Telephone Encounter (Signed)
ICM transmission received. I called the patient's home # and was told he was taking a nap by the person answering the phone. I advised I would call back at a later time.

## 2015-01-16 ENCOUNTER — Encounter: Payer: Self-pay | Admitting: *Deleted

## 2015-01-16 NOTE — Telephone Encounter (Signed)
I called and spoke with the patient  ?

## 2015-01-16 NOTE — Progress Notes (Signed)
EPIC Encounter for ICM Monitoring  Patient Name: [name] is a 69 y.o. male Date: 01/16/2015 Primary Care Physican: Samuel Jester, DO Primary Cardiologist: Diona Browner Electrophysiologist: Allred Dry Weight: 191 lbs       In the past month, have you:  1. Gained more than 2 pounds in a day or more than 5 pounds in a week? no  2. Had changes in your medications (with verification of current medications)? no  3. Had more shortness of breath than is usual for you? no  4. Limited your activity because of shortness of breath? no  5. Not been able to sleep because of shortness of breath? no  6. Had increased swelling in your feet or ankles? no  7. Had symptoms of dehydration (dizziness, dry mouth, increased thirst, decreased urine output) no  8. Had changes in sodium restriction? no  9. Been compliant with medication? Yes   ICM trend:   Follow-up plan: ICM clinic phone appointment: 02/18/15. The patient's impedence was below baseline from ~ 4/26-5/7. He denies any change in his symptoms or weight gain. He does report that his BP was up a little bit last night- 138/88 and HR- 104. He took an extra 1/2 of his BP medication and his BP came down to 101/50 HR- 72. He did feel "weak" with the increase in his BP. He has stated though that at times he will notice his BP gets down to 80/48. He denies syncope or pre- syncope. He states he will get up and move around and this will help. He is exercising on a stationary bike ~ every other day due to problems with his feet. No changes made today. I did advise him to call back should his SBP consistently start to run <100. He voices understanding.   Copy of note sent to patient's primary care physician, primary cardiologist, and device following physician.  Sherri Rad, RN, BSN 01/16/2015 3:32 PM

## 2015-02-18 ENCOUNTER — Ambulatory Visit (INDEPENDENT_AMBULATORY_CARE_PROVIDER_SITE_OTHER): Payer: PRIVATE HEALTH INSURANCE | Admitting: *Deleted

## 2015-02-18 ENCOUNTER — Telehealth: Payer: Self-pay | Admitting: Cardiology

## 2015-02-18 DIAGNOSIS — I5022 Chronic systolic (congestive) heart failure: Secondary | ICD-10-CM

## 2015-02-18 DIAGNOSIS — Z95810 Presence of automatic (implantable) cardiac defibrillator: Secondary | ICD-10-CM | POA: Diagnosis not present

## 2015-02-18 NOTE — Telephone Encounter (Signed)
Spoke with pt and reminded pt of remote transmission that is due today. Pt verbalized understanding.   

## 2015-02-20 ENCOUNTER — Encounter: Payer: Self-pay | Admitting: *Deleted

## 2015-02-20 NOTE — Progress Notes (Signed)
EPIC Encounter for ICM Monitoring  Patient Name: [name] is a 69 y.o. male Date: 02/20/2015 Primary Care Physican: Samuel Jester, DO Primary Cardiologist: Diona Browner Electrophysiologist: Allred Dry Weight: 186 lbs       In the past month, have you:  1. Gained more than 2 pounds in a day or more than 5 pounds in a week? no  2. Had changes in your medications (with verification of current medications)? no  3. Had more shortness of breath than is usual for you? no  4. Limited your activity because of shortness of breath? no  5. Not been able to sleep because of shortness of breath? no  6. Had increased swelling in your feet or ankles? no  7. Had symptoms of dehydration (dizziness, dry mouth, increased thirst, decreased urine output) no  8. Had changes in sodium restriction? no  9. Been compliant with medication? Yes   ICM trend:   Follow-up plan: ICM clinic phone appointment: 04/01/15. No changes made today. Last month, the patient was having occasional episodes of low BP, but reports doing well over the last several weeks. He is scheduled to follow up on 03/01/15 with Dr. Johney Frame in [place].  Copy of note sent to patient's primary care physician, primary cardiologist, and device following physician.  Sherri Rad, RN, BSN 02/20/2015 12:17 PM

## 2015-02-28 ENCOUNTER — Encounter: Payer: Self-pay | Admitting: *Deleted

## 2015-03-01 ENCOUNTER — Encounter: Payer: Self-pay | Admitting: Internal Medicine

## 2015-03-01 ENCOUNTER — Encounter: Payer: Self-pay | Admitting: *Deleted

## 2015-03-01 ENCOUNTER — Ambulatory Visit (INDEPENDENT_AMBULATORY_CARE_PROVIDER_SITE_OTHER): Payer: Medicare HMO | Admitting: Internal Medicine

## 2015-03-01 VITALS — BP 108/78 | HR 68 | Ht 69.0 in | Wt 188.0 lb

## 2015-03-01 DIAGNOSIS — R0789 Other chest pain: Secondary | ICD-10-CM | POA: Insufficient documentation

## 2015-03-01 DIAGNOSIS — R0602 Shortness of breath: Secondary | ICD-10-CM | POA: Insufficient documentation

## 2015-03-01 DIAGNOSIS — I2510 Atherosclerotic heart disease of native coronary artery without angina pectoris: Secondary | ICD-10-CM | POA: Diagnosis not present

## 2015-03-01 DIAGNOSIS — I429 Cardiomyopathy, unspecified: Secondary | ICD-10-CM

## 2015-03-01 DIAGNOSIS — I5022 Chronic systolic (congestive) heart failure: Secondary | ICD-10-CM

## 2015-03-01 NOTE — Patient Instructions (Addendum)
Your physician recommends that you continue on your current medications as directed. Please refer to the Current Medication list given to you today. Merlin device check on 06/03/15. Your physician recommends that you schedule a follow-up appointment in: 1 year with Dr. Johney Frame. You will receive a reminder letter in the mail in about 10 months reminding you to call and schedule your appointment. If you don't receive this letter, please contact our office. Your physician has requested that you have a lexiscan myoview. For further information please visit [email or web address] Please follow instruction sheet, as given.

## 2015-03-01 NOTE — Progress Notes (Signed)
PCP:  Samuel Jester, DO Primary Cardiologist: Dr Diona Browner  The patient presents today for routine electrophysiology followup.  Since last being seen in our clinic, the patient reports doing reasonably well.  Over the past few days he has had some chest discomfort.  He attributes this to "eating bad cantelope" several days ago.  He also has had some progressive SOB symptoms. Today, he denies symptoms of palpitations, orthopnea, PND, dizziness, presyncope, syncope, or ICD shocks.  He has stable mild edema. The patient feels that he is tolerating medications without difficulties and is otherwise without complaint today.   Past Medical History  Diagnosis Date  . Essential hypertension, benign   . Mixed hyperlipidemia   . Cardiomyopathy     Mixed, LVEF 35% up to 50%  . H/O alcohol abuse   . Depression   . Obesity   . COPD (chronic obstructive pulmonary disease)   . Osteoarthritis   . DDD (degenerative disc disease)   . GERD (gastroesophageal reflux disease)   . ACTH deficiency     Dr. Lucianne Muss  . ICD (implantable cardiac defibrillator) in place   . Shortness of breath   . Sleep apnea     Noncompliance with CPAP therapy  . Full dentures   . Wears glasses   . Coronary atherosclerosis of native coronary artery     Occluded RCA with good collaterals   Past Surgical History  Procedure Laterality Date  . Umbilical hernia repair    . Knee surgery    . Ankle surgery  right  . Femur surgery  right  . Nasal sinus surgery    . Icd implantation  06/18/2009    St Jude Fortify DR implanted by Dr. Johney Frame  . Transurethral resection of prostate  2013  . Back surgery      Multiple back surgeries for chronic back pain and lumbar fusion  . Foot arthrodesis Right 11/10/2012    Procedure: Right Hallux Metatarsal-phalangeal Joint Arthrodesis; Second Metatarsal Head Resection and Second, Third and Fourth Metatarsal Weil Osteotomy;  Surgeon: Toni Arthurs, MD;  Location: [facility];  Service:  Orthopedics;  Laterality: Right;  . Hammer toe surgery Right 11/10/2012    Procedure: Right Second, Third, and Fourth Hammertoe Corrections;  Surgeon: Toni Arthurs, MD;  Location: [facility];  Service: Orthopedics;  Laterality: Right;    Current Outpatient Prescriptions  Medication Sig Dispense Refill  . ALPRAZolam (XANAX) 1 MG tablet Take 1 mg by mouth at bedtime as needed for anxiety.    Marland Kitchen aspirin 81 MG tablet Take 81 mg by mouth daily.    . Cholecalciferol (VITAMIN D3) 5000 UNITS CAPS Take 1 capsule by mouth daily.    . fenofibrate 160 MG tablet Take 160 mg by mouth daily.      . furosemide (LASIX) 80 MG tablet Take 80 mg by mouth daily.    Marland Kitchen gabapentin (NEURONTIN) 300 MG capsule Take 600 mg by mouth as directed. 1 tablet three times a day and 2 at bedtime.    . metoprolol succinate (TOPROL-XL) 25 MG 24 hr tablet TAKE 1 TABLET BY MOUTH EVERY DAY 30 tablet 6  . oxymorphone (OPANA ER) 40 MG 12 hr tablet Take 40 mg by mouth 2 (two) times daily.    . rosuvastatin (CRESTOR) 10 MG tablet Take 20 mg by mouth daily.     . tamsulosin (FLOMAX) 0.4 MG CAPS Take 0.4 mg by mouth daily after supper.    . temazepam (RESTORIL) 30 MG capsule  Take 30 mg by mouth at bedtime as needed for sleep.    Marland Kitchen VIIBRYD 40 MG TABS Take 1 tablet by mouth Daily.     No current facility-administered medications for this visit.    Allergies  Allergen Reactions  . Penicillins     REACTION: hives    History   Social History  . Marital Status: Divorced    Spouse Name: N/A  . Number of Children: N/A  . Years of Education: N/A   Occupational History  . Not on file.   Social History Main Topics  . Smoking status: Former Smoker -- 1.00 packs/day for 36 years    Types: Cigarettes    Quit date: 05/26/1997  . Smokeless tobacco: Never Used  . Alcohol Use: 0.0 oz/week    0 Standard drinks or equivalent per week     Comment: History of abuse. Still admits to occasional binges  . Drug Use: No  .  Sexual Activity: Not on file   Other Topics Concern  . Not on file   Social History Narrative    Family History  Problem Relation Age of Onset  . Coronary artery disease    . Arthritis      Physical Exam: Filed Vitals:   03/01/15 0913  BP: 108/78  Pulse: 68  Height:  (1.753 m)  Weight: 85.276 kg (188 lb)  SpO2: 96%    GEN- The patient is well appearing, alert and oriented x 3 today.   Head- normocephalic, atraumatic Eyes-  Sclera clear, conjunctiva pink Ears- hearing intact Oropharynx- clear Neck- supple,   Lungs- Clear to ausculation bilaterally, normal work of breathing Chest- ICD pocket is well healed Heart- Regular rate and rhythm, no murmurs, rubs or gallops, PMI not laterally displaced GI- soft, NT, ND, + BS Extremities- no clubbing, cyanosis, or edema  ICD interrogation- reviewed in detail today,  See PACEART report  Assessment and Plan:  1. 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    ETOH avoidance Normal ICD function See [facility]rit[facility] Art report Given new chest discomfort and SOB, will obtain a lexiscan myoview.  If low risk, would continue medical therapy.  If high risk, will review with Dr Diona Browner.  2. CAD As above  3. HTN Stable No change required today   + Merlin follow-up Return to the device clinic in a year

## 2015-03-05 LAB — CUP PACEART INCLINIC DEVICE CHECK
Brady Statistic RV Percent Paced: 6.6 %
Date Time Interrogation Session: 20160701132028
HIGH POWER IMPEDANCE MEASURED VALUE: 45.5383
Lead Channel Impedance Value: 262.5 Ohm
Lead Channel Impedance Value: 375 Ohm
Lead Channel Pacing Threshold Amplitude: 0.75 V
Lead Channel Pacing Threshold Amplitude: 1.25 V
Lead Channel Pacing Threshold Pulse Width: 0.5 ms
Lead Channel Pacing Threshold Pulse Width: 0.8 ms
Lead Channel Setting Pacing Amplitude: 2 V
Lead Channel Setting Pacing Pulse Width: 0.8 ms
MDC IDC MSMT BATTERY REMAINING LONGEVITY: 42 mo
MDC IDC MSMT LEADCHNL RA SENSING INTR AMPL: 3.8 mV
MDC IDC MSMT LEADCHNL RV SENSING INTR AMPL: 11.7 mV
MDC IDC SET LEADCHNL RV PACING AMPLITUDE: 3 V
MDC IDC SET LEADCHNL RV SENSING SENSITIVITY: 0.5 mV
MDC IDC STAT BRADY RA PERCENT PACED: 65 %
Pulse Gen Serial Number: 593609
Zone Setting Detection Interval: 300 ms
Zone Setting Detection Interval: 355 ms

## 2015-03-06 ENCOUNTER — Inpatient Hospital Stay (HOSPITAL_COMMUNITY): Admission: RE | Admit: 2015-03-06 | Payer: Medicare HMO | Source: Ambulatory Visit

## 2015-03-06 ENCOUNTER — Encounter (HOSPITAL_COMMUNITY)
Admission: RE | Admit: 2015-03-06 | Discharge: 2015-03-06 | Disposition: A | Discharge status: Home / Self Care | Payer: Medicare HMO | Source: Ambulatory Visit | Attending: Internal Medicine | Admitting: Internal Medicine

## 2015-03-06 ENCOUNTER — Encounter (HOSPITAL_COMMUNITY): Payer: Self-pay

## 2015-03-06 DIAGNOSIS — I5022 Chronic systolic (congestive) heart failure: Secondary | ICD-10-CM

## 2015-03-06 DIAGNOSIS — I429 Cardiomyopathy, unspecified: Secondary | ICD-10-CM | POA: Insufficient documentation

## 2015-03-06 DIAGNOSIS — R0789 Other chest pain: Secondary | ICD-10-CM | POA: Diagnosis present

## 2015-03-06 LAB — NM MYOCAR MULTI W/SPECT W/WALL MOTION / EF
CHL CUP RESTING HR STRESS: 62 {beats}/min
LV dias vol: 171 mL
LVSYSVOL: 91 mL
NUC STRESS TID: 0.96
Peak HR: 88 {beats}/min
RATE: 0.56
SDS: 8
SRS: 4
SSS: 11

## 2015-03-06 MED ORDER — REGADENOSON 0.4 MG/5ML IV SOLN
INTRAVENOUS | Status: AC
  Administered 2015-03-06: 0.4 mg via INTRAVENOUS
  Filled 2015-03-06: qty 5

## 2015-03-06 MED ORDER — SODIUM CHLORIDE 0.9 % IJ SOLN
INTRAMUSCULAR | Status: AC
  Administered 2015-03-06: 10 mL via INTRAVENOUS
  Filled 2015-03-06: qty 3

## 2015-03-06 MED ORDER — TECHNETIUM TC 99M SESTAMIBI GENERIC - CARDIOLITE
10.0000 | Freq: Once | INTRAVENOUS | Status: AC | PRN
  Administered 2015-03-06: 11 via INTRAVENOUS

## 2015-03-06 MED ORDER — TECHNETIUM TC 99M SESTAMIBI - CARDIOLITE
30.0000 | Freq: Once | INTRAVENOUS | Status: AC | PRN
  Administered 2015-03-06: 30 via INTRAVENOUS

## 2015-03-08 ENCOUNTER — Telehealth: Payer: Self-pay | Admitting: *Deleted

## 2015-03-08 NOTE — Telephone Encounter (Signed)
Patient informed and verbalized understanding of plan. 

## 2015-03-08 NOTE — Telephone Encounter (Signed)
-----   Message from Samuel G McDowell, MD sent at 03/07/2015  7:42 AM EDT ----- Reviewed. This test was ordered by Dr. Allred. I reviewed his recent office note. Patient described somewhat atypical chest pain at that point. Cardiolite demonstrates moderate-sized region of inferior scar with very mild peri-infarct ischemia. This would be consistent with his known history of occluded RCA with left-to-right collaterals. Unless his symptoms progress, would continue medical therapy and observation. 

## 2015-03-08 NOTE — Telephone Encounter (Signed)
-----   Message from Jonelle Sidle, MD sent at 03/07/2015  7:42 AM EDT ----- Reviewed. This test was ordered by Dr. Johney Frame. I reviewed his recent office note. Patient described somewhat atypical chest pain at that point. Cardiolite demonstrates moderate-sized region of inferior scar with very mild peri-infarct ischemia. This would be consistent with his known history of occluded RCA with left-to-right collaterals. Unless his symptoms progress, would continue medical therapy and observation.

## 2015-04-01 ENCOUNTER — Ambulatory Visit (INDEPENDENT_AMBULATORY_CARE_PROVIDER_SITE_OTHER): Payer: Medicare HMO | Admitting: *Deleted

## 2015-04-01 DIAGNOSIS — Z95810 Presence of automatic (implantable) cardiac defibrillator: Secondary | ICD-10-CM

## 2015-04-01 DIAGNOSIS — I5022 Chronic systolic (congestive) heart failure: Secondary | ICD-10-CM

## 2015-04-02 NOTE — Progress Notes (Signed)
EPIC Encounter for ICM Monitoring  Patient Name: [name] is a 69 y.o. male Date: 04/02/2015 Primary Care Physican: Samuel Jester, DO Primary Cardiologist: Diona Browner Electrophysiologist: Allred Dry Weight: 186 lbs       In the past month, have you:  1. Gained more than 2 pounds in a day or more than 5 pounds in a week? no  2. Had changes in your medications (with verification of current medications)? no  3. Had more shortness of breath than is usual for you? no  4. Limited your activity because of shortness of breath? no  5. Not been able to sleep because of shortness of breath? no  6. Had increased swelling in your feet or ankles? no  7. Had symptoms of dehydration (dizziness, dry mouth, increased thirst, decreased urine output) no  8. Had changes in sodium restriction? no  9. Been compliant with medication? Yes   ICM trend:   Follow-up plan: ICM clinic phone appointment: 05/03/15. The patient reports that he is doing well today. Symptoms of his chest discomfort have improved as were reported to Dr. Johney Frame on 03/01/15. No changes.  Copy of note sent to patient's primary care physician, primary cardiologist, and device following physician.  Sherri Rad, RN, BSN 04/02/2015 12:18 PM

## 2015-05-03 ENCOUNTER — Ambulatory Visit (INDEPENDENT_AMBULATORY_CARE_PROVIDER_SITE_OTHER): Payer: Medicare HMO

## 2015-05-03 ENCOUNTER — Telehealth: Payer: Self-pay

## 2015-05-03 DIAGNOSIS — I5022 Chronic systolic (congestive) heart failure: Secondary | ICD-10-CM | POA: Diagnosis not present

## 2015-05-03 DIAGNOSIS — Z95810 Presence of automatic (implantable) cardiac defibrillator: Secondary | ICD-10-CM

## 2015-05-03 NOTE — Telephone Encounter (Signed)
ICM transmission received.  Attempted cell and home phone number. Left message on home number for return call.

## 2015-05-07 NOTE — Telephone Encounter (Signed)
Spoke with patient.

## 2015-05-07 NOTE — Progress Notes (Signed)
EPIC Encounter for ICM Monitoring  Patient Name: [name] is a 69 y.o. male Date: 05/07/2015 Primary Care Physican: Samuel Jester, DO Primary Cardiologist: Diona Browner Electrophysiologist: Allred Dry Weight: 189 lbs       In the past month, have you:  1. Gained more than 2 pounds in a day or more than 5 pounds in a week? Yes, 2 pounds in last 2 days.  He said he takes extra 1/2 tablet of Lasix if needed for weight gain and that is with Dr McDowell's permission. He ate more than usual in the last weekend and did not weigh at same time with same clothes on.  Education given to reported any further weight gain to office and to weigh everyday after rising and emptying bladder with same clothes on.    2. Had changes in your medications (with verification of current medications)? no  3. Had more shortness of breath than is usual for you? no  4. Limited your activity because of shortness of breath? no  5. Not been able to sleep because of shortness of breath? no  6. Had increased swelling in your feet or ankles? no  7. Had symptoms of dehydration (dizziness, dry mouth, increased thirst, decreased urine output) no  8. Had changes in sodium restriction? no  9. Been compliant with medication? Yes   ICM trend:   Follow-up plan: ICM clinic phone appointment 06/07/2015.  Corvue transmission revealed impedance below baseline ~04/22/2015 to 04/30/2015 and he stated it may be because he drinks a lot of fluids daily.  He thinks he drinks as much as 1 gallon a day of liquids. Education given excess fluid intake can cause fluid retention and to discuss with Dr Diona Browner at his appointment on 05/21/2015 if he should have a limit of fluid intake.  Impedance is above baseline 04/30/2015 to 05/03/2015 and he reported he was working outside in the heat and may not have drank enough fluids.  Education given to stay hydrated when outside in heat and humidity.    Copy of note sent to patient's primary care  physician, primary cardiologist, and device following physician.  Karie Soda, RN, CCM 05/07/2015 11:01 AM

## 2015-05-21 ENCOUNTER — Ambulatory Visit (INDEPENDENT_AMBULATORY_CARE_PROVIDER_SITE_OTHER): Payer: Medicare HMO | Admitting: Cardiology

## 2015-05-21 ENCOUNTER — Encounter: Payer: Self-pay | Admitting: Cardiology

## 2015-05-21 VITALS — BP 120/79 | HR 61 | Ht 69.0 in | Wt 186.0 lb

## 2015-05-21 DIAGNOSIS — I2510 Atherosclerotic heart disease of native coronary artery without angina pectoris: Secondary | ICD-10-CM

## 2015-05-21 DIAGNOSIS — Z95810 Presence of automatic (implantable) cardiac defibrillator: Secondary | ICD-10-CM

## 2015-05-21 DIAGNOSIS — I429 Cardiomyopathy, unspecified: Secondary | ICD-10-CM

## 2015-05-21 NOTE — Patient Instructions (Signed)
Your physician recommends that you continue on your current medications as directed. Please refer to the Current Medication list given to you today. Your physician recommends that you schedule a follow-up appointment in: 6 months. You will receive a reminder letter in the mail in about 4 months reminding you to call and schedule your appointment. If you don't receive this letter, please contact our office. 

## 2015-05-21 NOTE — Progress Notes (Signed)
Cardiology Office Note  Date: 05/21/2015   ID: [name], [name] [birth year], MRN [id number]  PCP: Samuel Jester, DO  Primary Cardiologist: Nona Dell, MD   Chief Complaint  Patient presents with  . Coronary Artery Disease  . Cardiomyopathy    History of Present Illness: [name] is a 70 y.o. male last seen in March. Interval visit noted with Dr. Johney Frame in July at which time a Cardiolite was arranged for evaluation of atypical chest pain. The results are outlined below, medical therapy has been continued.  He presents reporting no reproducible angina symptoms. We reviewed his medications which are outlined below. We also discussed his cardiac testing over the last 6 months. LVEF remains normal range. He states that he drinks alcohol only occasionally, has a prior history of alcohol abuse.  Reports no palpitations or device discharges.   Past Medical History  Diagnosis Date  . Essential hypertension, benign   . Mixed hyperlipidemia   . Cardiomyopathy     Mixed, LVEF 35% up to 50%  . H/O alcohol abuse   . Depression   . Obesity   . COPD (chronic obstructive pulmonary disease)   . Osteoarthritis   . DDD (degenerative disc disease)   . GERD (gastroesophageal reflux disease)   . ACTH deficiency     Dr. Lucianne Muss  . ICD (implantable cardiac defibrillator) in place   . Sleep apnea     Noncompliance with CPAP therapy  . Full dentures   . Wears glasses   . Coronary atherosclerosis of native coronary artery     Occluded RCA with good collaterals    Current Outpatient Prescriptions  Medication Sig Dispense Refill  . ALPRAZolam (XANAX) 1 MG tablet Take 1 mg by mouth at bedtime as needed for anxiety.    Marland Kitchen aspirin 81 MG tablet Take 81 mg by mouth daily.    . Cholecalciferol (VITAMIN D3) 5000 UNITS CAPS Take 1 capsule by mouth daily.    . fenofibrate 160 MG tablet Take 160 mg by mouth daily.      . furosemide (LASIX) 80 MG tablet Take 80 mg by mouth daily.    Marland Kitchen  gabapentin (NEURONTIN) 300 MG capsule Take 600 mg by mouth as directed. 1 tablet three times a day and 2 at bedtime.    . metoprolol succinate (TOPROL-XL) 25 MG 24 hr tablet TAKE 1 TABLET BY MOUTH EVERY DAY 30 tablet 6  . oxyCODONE (ROXICODONE) 15 MG immediate release tablet Take 15 mg by mouth 4 (four) times daily.     . OXYCONTIN 60 MG T12A Take 1 tablet by mouth 2 (two) times daily.     . rosuvastatin (CRESTOR) 10 MG tablet Take 20 mg by mouth daily.     . tamsulosin (FLOMAX) 0.4 MG CAPS Take 0.4 mg by mouth daily after supper.    . temazepam (RESTORIL) 30 MG capsule Take 30 mg by mouth at bedtime as needed for sleep.    Marland Kitchen VIIBRYD 40 MG TABS Take 1 tablet by mouth Daily.     No current facility-administered medications for this visit.    Allergies:  Penicillins   Social History: The patient  reports that he quit smoking about 17 years ago. His smoking use included Cigarettes. He has a 36 pack-year smoking history. He has never used smokeless tobacco. He reports that he drinks alcohol. He reports that he does not use illicit drugs.   ROS:  Please see the history of present illness. Otherwise, complete  review of systems is positive for anxiety.  All other systems are reviewed and negative.   Physical Exam: VS:  BP 120/79 mmHg  Pulse 61  Ht  (1.753 m)  Wt 186 lb (84.369 kg)  BMI 27.45 kg/m2  SpO2 96%, BMI Body mass index is 27.45 kg/(m^2).  Wt Readings from Last 3 Encounters:  05/21/15 186 lb (84.369 kg)  03/01/15 188 lb (85.276 kg)  11/14/14 194 lb 1.9 oz (88.052 kg)     Overweight male, comfortable at rest.  HEENT: Conjunctiva and lids normal, oropharynx clear.  Neck: Supple, no elevated JVP, no thyromegaly.  Lungs: Decreased but clear to auscultation, nonlabored breathing at rest.  Cardiac: Regular rate and rhythm with frequent ectopy, no S3, no pericardial rub.  Abdomen: Obese, nontender, bowel sounds present, no guarding or rebound.  Extremities: Trace to 1+  edema, distal pulses 1+.   ECG: ECG is not ordered today.  Other Studies Reviewed Today:  Echocardiogram 11/22/2014: Study Conclusions  - Left ventricle: The cavity size was normal. Wall thickness was normal. Systolic function was normal. The estimated ejection fraction was in the range of 55% to 60%. Wall motion was normal; there were no regional wall motion abnormalities. Left ventricular diastolic function parameters were normal. - Aortic valve: Valve area (VTI): 3.78 cm^2. Valve area (Vmax): 3.44 cm^2. Valve area (Vmean): 3.59 cm^2. - Mitral valve: There was mild regurgitation directed posteriorly. - Left atrium: The atrium was moderately dilated. - Atrial septum: No defect or patent foramen ovale was identified. - Technically adequate study.  Lexiscan Cardiolite 03/06/2015:  Defect 1: There is a medium defect of moderate severity present in the basal inferoseptal, basal inferior, mid inferoseptal, mid inferior and apical inferior location.  Findings consistent with prior myocardial infarction with peri-infarct ischemia.  This is an intermediate risk study.  The left ventricular ejection fraction is mildly decreased (45-54%).  There is a moderate sized area of inferior wall scar extending from the apex to the base with a very mild degree of peri-infarct ischemia.  There is a moderate sized area of inferior wall scar extending from the apex to the base with a very mild degree of peri-infarct ischemia.  Assessment and Plan:  1. Mixed cardiomyopathy, largely nonischemic with normalization of LVEF on medical therapy and with reduction in alcohol intake. Most recent LVEF 55-60%.  2. St. Jude ICD in place, followed by Dr. Johney Frame.  3. CAD with occluded RCA associated with collaterals. Recent Cardiolite consistent with inferior scar and very mild peri-infarct ischemia. Continue medical therapy and observation. He does not endorse any progressing angina  symptoms.  Current medicines were reviewed with the patient today.  Disposition: FU with me in 6 months.   Signed, Jonelle Sidle, MD, [facility] 05/21/2015 12:09 PM    [facility] at [facility] [address] [place], [place], [place] [address] Phone: [phone]; Fax: [phone]

## 2015-06-07 ENCOUNTER — Ambulatory Visit (INDEPENDENT_AMBULATORY_CARE_PROVIDER_SITE_OTHER): Payer: Medicare HMO | Admitting: *Deleted

## 2015-06-07 ENCOUNTER — Encounter: Payer: Self-pay | Admitting: Internal Medicine

## 2015-06-07 DIAGNOSIS — Z95810 Presence of automatic (implantable) cardiac defibrillator: Secondary | ICD-10-CM | POA: Diagnosis not present

## 2015-06-07 DIAGNOSIS — I429 Cardiomyopathy, unspecified: Secondary | ICD-10-CM | POA: Diagnosis not present

## 2015-06-07 DIAGNOSIS — I5022 Chronic systolic (congestive) heart failure: Secondary | ICD-10-CM | POA: Diagnosis not present

## 2015-06-07 NOTE — Progress Notes (Signed)
EPIC Encounter for ICM Monitoring  Patient Name: [name] is a 69 y.o. male Date: 06/07/2015 Primary Care Physican: Samuel Jester, DO Primary Cardiologist: Diona Browner Electrophysiologist: Allred Dry Weight: 182.6 lb       In the past month, have you:  1. Gained more than 2 pounds in a day or more than 5 pounds in a week? no  2. Had changes in your medications (with verification of current medications)? no  3. Had more shortness of breath than is usual for you? no  4. Limited your activity because of shortness of breath? no  5. Not been able to sleep because of shortness of breath? no  6. Had increased swelling in your feet or ankles? no  7. Had symptoms of dehydration (dizziness, dry mouth, increased thirst, decreased urine output) no  8. Had changes in sodium restriction? no  9. Been compliant with medication? Yes   ICM trend:   Follow-up plan: ICM clinic phone appointment 07/09/2015.  CorVue impedance below baseline 06/01/2015 to 06/03/2015 and he denied any symptoms.  He reported he had increase in heart rate to 137 when he walked to and from the mail box.  He stated he can tell his heart rate is up and feels fast beating.  Reviewed transmission with device clinic team and no alerts or changes from previous transmissions noted.   He denied any HF symptoms and asked how to manage HF.  Education review on HF and home care.  Patient education mailed regarding Low sodium foods and Heart Failure Education.  No changes today.  Copy of note sent to patient's primary care physician, primary cardiologist, and device following physician.  Karie Soda, RN, CCM 06/07/2015 11:58 AM

## 2015-06-26 LAB — CUP PACEART REMOTE DEVICE CHECK
Brady Statistic RV Percent Paced: 2.2 %
HighPow Impedance: 59 Ohm
Implantable Lead Implant Date: 20101019
Implantable Lead Location: 753859
Lead Channel Impedance Value: 280 Ohm
Lead Channel Impedance Value: 400 Ohm
Lead Channel Sensing Intrinsic Amplitude: 11.7 mV
Lead Channel Sensing Intrinsic Amplitude: 3.1 mV
MDC IDC LEAD IMPLANT DT: 20101019
MDC IDC LEAD LOCATION: 753860
MDC IDC LEAD MODEL: 7121
MDC IDC MSMT BATTERY REMAINING LONGEVITY: 24 mo
MDC IDC SESS DTM: 20161026111441
MDC IDC STAT BRADY RA PERCENT PACED: 64 %
Pulse Gen Serial Number: 593609

## 2015-07-05 ENCOUNTER — Other Ambulatory Visit: Payer: Self-pay | Admitting: Cardiology

## 2015-07-09 ENCOUNTER — Ambulatory Visit (INDEPENDENT_AMBULATORY_CARE_PROVIDER_SITE_OTHER): Payer: Medicare HMO

## 2015-07-09 DIAGNOSIS — Z95810 Presence of automatic (implantable) cardiac defibrillator: Secondary | ICD-10-CM | POA: Diagnosis not present

## 2015-07-09 DIAGNOSIS — I5022 Chronic systolic (congestive) heart failure: Secondary | ICD-10-CM

## 2015-07-09 NOTE — Progress Notes (Signed)
EPIC Encounter for ICM Monitoring  Patient Name: [name] is a 69 y.o. male Date: 07/09/2015 Primary Care Physican: Samuel Jester, DO Primary Cardiologist: Diona Browner Electrophysiologist: Allred  Dry Weight: 179.6 lbs (decrease of 3 lbs since 06/07/2015)       In the past month, have you:  1. Gained more than 2 pounds in a day or more than 5 pounds in a week? no  2. Had changes in your medications (with verification of current medications)? no  3. Had more shortness of breath than is usual for you? no  4. Limited your activity because of shortness of breath? no  5. Not been able to sleep because of shortness of breath? no  6. Had increased swelling in your feet or ankles? no  7. Had symptoms of dehydration (dizziness, dry mouth, increased thirst, decreased urine output) no  8. Had changes in sodium restriction? no  9. Been compliant with medication? Yes   ICM trend: 07/09/2015   Follow-up plan: ICM clinic phone appointment on 08/12/2015. Corvue impedance above baseline 07/04/2015 to 07/09/2015 suggesting dryness and he reported dizziness on 07/07/2015.  He reported he took additional Furosemide dosage 1/2 tablet on 07/02/2015 thinking he felt like he was having fluid but only symptom reported was decreased urine output after taking morning dose.  He stated he had 4 days of jury duty and had cut back on fluids.  Education to drink moderate amount of fluids daily ~ 64 oz.  He stated he was drinking ~about 1 1/2 gallons of fluid a day so he had decreased the amount of fluids.  Advised to drink in moderation.  Education given to follow Dr McDowell's orders for taking extra Furosemide due to possible effects on the kidneys.  Asked if he has had any recent labs at the PCP office and he stated yes, 06/24/2015. Call to Dr Shela Commons office [phone]) and requested faxed copy of latest labs from and she stated she would fax the information.    06/24/2015 lab results:  BUN 17, Creatinine  1.30, Potassium 4.8, Sodium 145 04/04/2015 lab results:  BUN 14, Creatinine 1.38, Potassium 4.2, Sodium 140  Copy of note sent to patient's primary care physician, primary cardiologist, and device following physician.  Karie Soda, RN, CCM 07/09/2015 10:02 AM

## 2015-07-11 ENCOUNTER — Encounter: Payer: Self-pay | Admitting: *Deleted

## 2015-07-22 ENCOUNTER — Encounter: Payer: Self-pay | Admitting: Internal Medicine

## 2015-08-12 ENCOUNTER — Ambulatory Visit (INDEPENDENT_AMBULATORY_CARE_PROVIDER_SITE_OTHER): Payer: Medicare HMO

## 2015-08-12 DIAGNOSIS — Z95810 Presence of automatic (implantable) cardiac defibrillator: Secondary | ICD-10-CM

## 2015-08-12 DIAGNOSIS — I5022 Chronic systolic (congestive) heart failure: Secondary | ICD-10-CM | POA: Diagnosis not present

## 2015-08-13 ENCOUNTER — Telehealth: Payer: Self-pay

## 2015-08-13 NOTE — Telephone Encounter (Signed)
ICM transmission received.  Attempted call and person answering phone stated he was not available.  Will attempt call back.

## 2015-08-19 NOTE — Telephone Encounter (Signed)
Spoke with patient.

## 2015-08-19 NOTE — Progress Notes (Signed)
EPIC Encounter for ICM Monitoring  Patient Name: [name] is a 69 y.o. male Date: 08/19/2015 Primary Care Physican: Samuel Jester, DO Primary Cardiologist: Diona Browner Electrophysiologist: Allred Dry Weight: 178-179 lb       In the past month, have you:  1. Gained more than 2 pounds in a day or more than 5 pounds in a week? no  2. Had changes in your medications (with verification of current medications)? no  3. Had more shortness of breath than is usual for you? no  4. Limited your activity because of shortness of breath? no  5. Not been able to sleep because of shortness of breath? no  6. Had increased swelling in your feet or ankles? no  7. Had symptoms of dehydration (dizziness, dry mouth, increased thirst, decreased urine output) no  8. Had changes in sodium restriction? no  9. Been compliant with medication? Yes   ICM trend:  08/12/2015   Follow-up plan: ICM clinic phone appointment 09/19/2015.  Corvue daily impedance below baseline ~07/13/2015 to 07/24/2015 suggesting fluid retention.  He reported he ate foods for the holiday that had more sodium and he had some belly swelling.  He reported taking extra 1 extra Furosemide during that time to alleviate the fluid.  He denied any symptoms at this time.  Reviewed to follow low sodium diet and can balance the sodium by calculating about 600mg  each meal and he would still have about 200 mg available for snacks.  No changes today.    Copy of note sent to patient's primary care physician, primary cardiologist, and device following physician.  Karie Soda, RN, CCM 08/19/2015 8:47 AM

## 2015-09-19 ENCOUNTER — Ambulatory Visit (INDEPENDENT_AMBULATORY_CARE_PROVIDER_SITE_OTHER): Payer: Medicare HMO

## 2015-09-19 ENCOUNTER — Ambulatory Visit (INDEPENDENT_AMBULATORY_CARE_PROVIDER_SITE_OTHER): Payer: Medicare HMO | Admitting: *Deleted

## 2015-09-19 DIAGNOSIS — I429 Cardiomyopathy, unspecified: Secondary | ICD-10-CM | POA: Diagnosis not present

## 2015-09-19 DIAGNOSIS — I5022 Chronic systolic (congestive) heart failure: Secondary | ICD-10-CM

## 2015-09-19 DIAGNOSIS — Z95810 Presence of automatic (implantable) cardiac defibrillator: Secondary | ICD-10-CM

## 2015-09-19 NOTE — Progress Notes (Signed)
Remote ICD transmission.   

## 2015-09-20 NOTE — Progress Notes (Signed)
EPIC Encounter for ICM Monitoring  Patient Name: [name] is a 70 y.o. male Date: 09/20/2015 Primary Care Physican: Samuel Jester, DO Primary Cardiologist: Diona Browner Electrophysiologist: Allred Dry Weight: 187 lb       In the past month, have you:  1. Gained more than 2 pounds in a day or more than 5 pounds in a week? Yes, 7 lbs in the last 3 weeks and has lost 3 of the 7 lbs gained.    2. Had changes in your medications (with verification of current medications)? no  3. Had more shortness of breath than is usual for you? Yes, at night.    4. Limited your activity because of shortness of breath? no  5. Not been able to sleep because of shortness of breath? no  6. Had increased swelling in your feet or ankles? no  7. Had symptoms of dehydration (dizziness, dry mouth, increased thirst, decreased urine output) no  8. Had changes in sodium restriction? no  9. Been compliant with medication? Yes   ICM trend: 3 month view for 09/19/2015         ICM trend: 1 year view for 09/18/2015   Follow-up plan: ICM clinic phone appointment 10/23/2015.  Corvue thoracic impedance below baseline 09/14/2015 and starting to return to baseline on 09/17/2015.  Patient reported weight gain,  belly swelling and shortness of breath for 2 nights in the last week.  He stated he has taken extra Furosemide 40 mg twice in the last week and plans to take another one tonight.  He stated his symptoms have improved and weight has decreased by 3- 4 pounds.   He stated he is due to make an appointment with Dr Diona Browner and plans on doing that soon.  Advised to follow fluid intake of 64 oz and limit salt intake to 2000 mg daily.  Encouraged to call if the extra Furosemide does not continue to improve his symptoms.    Copy of note sent to patient's primary care physician, primary cardiologist, and device following physician.  Karie Soda, RN, CCM 09/20/2015 2:31 PM

## 2015-09-24 LAB — CUP PACEART REMOTE DEVICE CHECK
Battery Remaining Longevity: 32 mo
Battery Remaining Percentage: 38 %
Brady Statistic AS VS Percent: 37 %
Brady Statistic RV Percent Paced: 2.4 %
HighPow Impedance: 53 Ohm
Implantable Lead Implant Date: 20101019
Implantable Lead Location: 753859
Lead Channel Sensing Intrinsic Amplitude: 2.9 mV
Lead Channel Setting Pacing Amplitude: 2 V
Lead Channel Setting Pacing Amplitude: 3 V
MDC IDC LEAD IMPLANT DT: 20101019
MDC IDC LEAD LOCATION: 753860
MDC IDC LEAD MODEL: 7121
MDC IDC MSMT BATTERY VOLTAGE: 2.9 V
MDC IDC MSMT LEADCHNL RA IMPEDANCE VALUE: 280 Ohm
MDC IDC MSMT LEADCHNL RV IMPEDANCE VALUE: 380 Ohm
MDC IDC MSMT LEADCHNL RV SENSING INTR AMPL: 11.7 mV
MDC IDC PG SERIAL: 593609
MDC IDC SESS DTM: 20170119091822
MDC IDC SET LEADCHNL RV PACING PULSEWIDTH: 0.8 ms
MDC IDC SET LEADCHNL RV SENSING SENSITIVITY: 0.5 mV
MDC IDC STAT BRADY AP VP PERCENT: 1.4 %
MDC IDC STAT BRADY AP VS PERCENT: 60 %
MDC IDC STAT BRADY AS VP PERCENT: 1 %
MDC IDC STAT BRADY RA PERCENT PACED: 59 %

## 2015-09-27 ENCOUNTER — Encounter: Payer: Self-pay | Admitting: Cardiology

## 2015-10-23 ENCOUNTER — Ambulatory Visit (INDEPENDENT_AMBULATORY_CARE_PROVIDER_SITE_OTHER): Payer: Medicare HMO

## 2015-10-23 DIAGNOSIS — I5022 Chronic systolic (congestive) heart failure: Secondary | ICD-10-CM

## 2015-10-23 DIAGNOSIS — Z95810 Presence of automatic (implantable) cardiac defibrillator: Secondary | ICD-10-CM

## 2015-10-24 NOTE — Progress Notes (Signed)
EPIC Encounter for ICM Monitoring  Patient Name: [name] is a 70 y.o. male Date: 10/24/2015 Primary Care Physican: Samuel Jester, DO Primary Cardiologist: Diona Browner Electrophysiologist: Allred Dry Weight: 183 lbs       In the past month, have you:  1. Gained more than 2 pounds in a day or more than 5 pounds in a week? no  2. Had changes in your medications (with verification of current medications)? no  3. Had more shortness of breath than is usual for you? no  4. Limited your activity because of shortness of breath? no  5. Not been able to sleep because of shortness of breath? no  6. Had increased swelling in your feet or ankles? no  7. Had symptoms of dehydration (dizziness, dry mouth, increased thirst, decreased urine output) no  8. Had changes in sodium restriction? no  9. Been compliant with medication? Yes   ICM trend: 3 month view for 10/23/2015   ICM trend: 1 year view for 10/23/2015   Follow-up plan: ICM clinic phone appointment on 11/25/2015.  Corvue thoracic impedance below reference line 10/21/2015 to 10/22/2015 suggesting fluid accumulation.  He reported he was not feeling well on those 2 days but feels fine today. He reported no fluid symptoms today.  Encouraged to call for any fluid symptoms.  No changes today.  Encouraged him to call Dr McDowell's office to set up 6 month follow up appointment.    Copy of note sent to patient's primary care physician, primary cardiologist, and device following physician.  Karie Soda, RN, CCM 10/24/2015 10:24 AM

## 2015-11-25 ENCOUNTER — Telehealth: Payer: Self-pay | Admitting: Cardiology

## 2015-11-25 NOTE — Telephone Encounter (Signed)
Attempted to confirm remote transmission with pt. No answer and was unable to leave a message.   

## 2015-11-25 NOTE — Telephone Encounter (Signed)
Attempted to call pt to request that he send a manual transmission b/c his home monitor has not updated in at least 8 days. No answer and unable to leave a message.

## 2015-11-26 NOTE — Progress Notes (Signed)
No ICM remote transmission received for 11/25/2015.   Next remote ICM transmission scheduled for 12/11/2015

## 2015-12-08 ENCOUNTER — Observation Stay (HOSPITAL_COMMUNITY)
Admission: EM | Admit: 2015-12-08 | Discharge: 2015-12-11 | Disposition: A | Discharge status: Home / Self Care | Payer: Medicare HMO | Attending: Internal Medicine | Admitting: Internal Medicine

## 2015-12-08 ENCOUNTER — Emergency Department (HOSPITAL_COMMUNITY): Payer: Medicare HMO

## 2015-12-08 ENCOUNTER — Encounter (HOSPITAL_COMMUNITY): Payer: Self-pay | Admitting: *Deleted

## 2015-12-08 DIAGNOSIS — E669 Obesity, unspecified: Secondary | ICD-10-CM | POA: Diagnosis not present

## 2015-12-08 DIAGNOSIS — S51812A Laceration without foreign body of left forearm, initial encounter: Secondary | ICD-10-CM | POA: Insufficient documentation

## 2015-12-08 DIAGNOSIS — S91311A Laceration without foreign body, right foot, initial encounter: Secondary | ICD-10-CM | POA: Diagnosis not present

## 2015-12-08 DIAGNOSIS — Z79899 Other long term (current) drug therapy: Secondary | ICD-10-CM | POA: Diagnosis not present

## 2015-12-08 DIAGNOSIS — K219 Gastro-esophageal reflux disease without esophagitis: Secondary | ICD-10-CM | POA: Diagnosis not present

## 2015-12-08 DIAGNOSIS — E782 Mixed hyperlipidemia: Secondary | ICD-10-CM | POA: Insufficient documentation

## 2015-12-08 DIAGNOSIS — Z7982 Long term (current) use of aspirin: Secondary | ICD-10-CM | POA: Diagnosis not present

## 2015-12-08 DIAGNOSIS — Z9119 Patient's noncompliance with other medical treatment and regimen: Secondary | ICD-10-CM | POA: Insufficient documentation

## 2015-12-08 DIAGNOSIS — I110 Hypertensive heart disease with heart failure: Secondary | ICD-10-CM | POA: Diagnosis not present

## 2015-12-08 DIAGNOSIS — I5022 Chronic systolic (congestive) heart failure: Secondary | ICD-10-CM | POA: Diagnosis not present

## 2015-12-08 DIAGNOSIS — R748 Abnormal levels of other serum enzymes: Secondary | ICD-10-CM | POA: Diagnosis present

## 2015-12-08 DIAGNOSIS — W1809XA Striking against other object with subsequent fall, initial encounter: Secondary | ICD-10-CM | POA: Insufficient documentation

## 2015-12-08 DIAGNOSIS — Z6826 Body mass index (BMI) 26.0-26.9, adult: Secondary | ICD-10-CM | POA: Diagnosis not present

## 2015-12-08 DIAGNOSIS — S31119A Laceration without foreign body of abdominal wall, unspecified quadrant without penetration into peritoneal cavity, initial encounter: Secondary | ICD-10-CM | POA: Insufficient documentation

## 2015-12-08 DIAGNOSIS — J449 Chronic obstructive pulmonary disease, unspecified: Secondary | ICD-10-CM | POA: Diagnosis not present

## 2015-12-08 DIAGNOSIS — N401 Enlarged prostate with lower urinary tract symptoms: Secondary | ICD-10-CM | POA: Diagnosis not present

## 2015-12-08 DIAGNOSIS — I2584 Coronary atherosclerosis due to calcified coronary lesion: Secondary | ICD-10-CM | POA: Insufficient documentation

## 2015-12-08 DIAGNOSIS — I200 Unstable angina: Secondary | ICD-10-CM | POA: Insufficient documentation

## 2015-12-08 DIAGNOSIS — R338 Other retention of urine: Secondary | ICD-10-CM | POA: Insufficient documentation

## 2015-12-08 DIAGNOSIS — I255 Ischemic cardiomyopathy: Secondary | ICD-10-CM | POA: Insufficient documentation

## 2015-12-08 DIAGNOSIS — M1990 Unspecified osteoarthritis, unspecified site: Secondary | ICD-10-CM | POA: Insufficient documentation

## 2015-12-08 DIAGNOSIS — I25110 Atherosclerotic heart disease of native coronary artery with unstable angina pectoris: Secondary | ICD-10-CM | POA: Diagnosis not present

## 2015-12-08 DIAGNOSIS — R0600 Dyspnea, unspecified: Secondary | ICD-10-CM | POA: Diagnosis present

## 2015-12-08 DIAGNOSIS — Z79891 Long term (current) use of opiate analgesic: Secondary | ICD-10-CM | POA: Insufficient documentation

## 2015-12-08 DIAGNOSIS — Z87891 Personal history of nicotine dependence: Secondary | ICD-10-CM | POA: Insufficient documentation

## 2015-12-08 DIAGNOSIS — R55 Syncope and collapse: Secondary | ICD-10-CM | POA: Diagnosis not present

## 2015-12-08 DIAGNOSIS — S51811A Laceration without foreign body of right forearm, initial encounter: Secondary | ICD-10-CM | POA: Insufficient documentation

## 2015-12-08 DIAGNOSIS — G8929 Other chronic pain: Secondary | ICD-10-CM | POA: Insufficient documentation

## 2015-12-08 DIAGNOSIS — I2582 Chronic total occlusion of coronary artery: Secondary | ICD-10-CM | POA: Diagnosis not present

## 2015-12-08 DIAGNOSIS — I2510 Atherosclerotic heart disease of native coronary artery without angina pectoris: Secondary | ICD-10-CM | POA: Diagnosis present

## 2015-12-08 DIAGNOSIS — R079 Chest pain, unspecified: Secondary | ICD-10-CM | POA: Diagnosis present

## 2015-12-08 DIAGNOSIS — S91312A Laceration without foreign body, left foot, initial encounter: Secondary | ICD-10-CM | POA: Insufficient documentation

## 2015-12-08 DIAGNOSIS — G4730 Sleep apnea, unspecified: Secondary | ICD-10-CM | POA: Diagnosis not present

## 2015-12-08 DIAGNOSIS — I428 Other cardiomyopathies: Secondary | ICD-10-CM | POA: Diagnosis not present

## 2015-12-08 DIAGNOSIS — Z95810 Presence of automatic (implantable) cardiac defibrillator: Secondary | ICD-10-CM | POA: Insufficient documentation

## 2015-12-08 DIAGNOSIS — F329 Major depressive disorder, single episode, unspecified: Secondary | ICD-10-CM | POA: Diagnosis not present

## 2015-12-08 HISTORY — DX: Pure hypercholesterolemia, unspecified: E78.00

## 2015-12-08 LAB — TROPONIN I: Troponin I: 0.06 ng/mL — ABNORMAL HIGH (ref <0.031)

## 2015-12-08 LAB — CBC WITH DIFFERENTIAL/PLATELET
BASOS ABS: 0 10*3/uL (ref 0.0–0.1)
BASOS PCT: 1 %
EOS ABS: 0 10*3/uL (ref 0.0–0.7)
EOS PCT: 1 %
HCT: 37.2 % — ABNORMAL LOW (ref 39.0–52.0)
Hemoglobin: 12.4 g/dL — ABNORMAL LOW (ref 13.0–17.0)
Lymphocytes Relative: 16 %
Lymphs Abs: 1 10*3/uL (ref 0.7–4.0)
MCH: 31.5 pg (ref 26.0–34.0)
MCHC: 33.3 g/dL (ref 30.0–36.0)
MCV: 94.4 fL (ref 78.0–100.0)
Monocytes Absolute: 0.6 10*3/uL (ref 0.1–1.0)
Monocytes Relative: 10 %
NEUTROS PCT: 74 %
Neutro Abs: 4.8 10*3/uL (ref 1.7–7.7)
PLATELETS: 286 10*3/uL (ref 150–400)
RBC: 3.94 MIL/uL — AB (ref 4.22–5.81)
RDW: 12.9 % (ref 11.5–15.5)
WBC: 6.5 10*3/uL (ref 4.0–10.5)

## 2015-12-08 LAB — COMPREHENSIVE METABOLIC PANEL
ALBUMIN: 4.1 g/dL (ref 3.5–5.0)
ALT: 17 U/L (ref 17–63)
AST: 36 U/L (ref 15–41)
Alkaline Phosphatase: 50 U/L (ref 38–126)
Anion gap: 10 (ref 5–15)
BUN: 18 mg/dL (ref 6–20)
CHLORIDE: 98 mmol/L — AB (ref 101–111)
CO2: 31 mmol/L (ref 22–32)
CREATININE: 1.28 mg/dL — AB (ref 0.61–1.24)
Calcium: 8.9 mg/dL (ref 8.9–10.3)
GFR calc non Af Amer: 55 mL/min — ABNORMAL LOW (ref >60)
GLUCOSE: 110 mg/dL — AB (ref 65–99)
Potassium: 3.1 mmol/L — ABNORMAL LOW (ref 3.5–5.1)
SODIUM: 139 mmol/L (ref 135–145)
Total Bilirubin: 0.5 mg/dL (ref 0.3–1.2)
Total Protein: 7.2 g/dL (ref 6.5–8.1)

## 2015-12-08 MED ORDER — TETANUS-DIPHTH-ACELL PERTUSSIS 5-2.5-18.5 LF-MCG/0.5 IM SUSP
INTRAMUSCULAR | Status: AC
  Administered 2015-12-08: 0.5 mL via INTRAMUSCULAR
  Filled 2015-12-08: qty 0.5

## 2015-12-08 MED ORDER — BACITRACIN ZINC 500 UNIT/GM EX OINT
TOPICAL_OINTMENT | CUTANEOUS | Status: AC
  Administered 2015-12-08
  Filled 2015-12-08: qty 1.8

## 2015-12-08 MED ORDER — NITROGLYCERIN 2 % TD OINT
1.0000 [in_us] | TOPICAL_OINTMENT | Freq: Once | TRANSDERMAL | Status: AC
  Administered 2015-12-09: 1 [in_us] via TOPICAL
  Filled 2015-12-08: qty 1
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
## 2015-12-08 NOTE — ED Notes (Signed)
MD at bedside. 
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
## 2015-12-08 NOTE — ED Notes (Signed)
Pt with multiple abrasions and small skin tears to bilateral forearms. Cleaned with wound cleaner and bacitracin ointment applied to all areas and dressed with telfa and kling

## 2015-12-09 ENCOUNTER — Encounter (HOSPITAL_COMMUNITY): Payer: Self-pay | Admitting: *Deleted

## 2015-12-09 DIAGNOSIS — R0600 Dyspnea, unspecified: Secondary | ICD-10-CM | POA: Diagnosis not present

## 2015-12-09 DIAGNOSIS — J449 Chronic obstructive pulmonary disease, unspecified: Secondary | ICD-10-CM | POA: Insufficient documentation

## 2015-12-09 DIAGNOSIS — Z95810 Presence of automatic (implantable) cardiac defibrillator: Secondary | ICD-10-CM | POA: Diagnosis not present

## 2015-12-09 DIAGNOSIS — I2584 Coronary atherosclerosis due to calcified coronary lesion: Secondary | ICD-10-CM | POA: Diagnosis not present

## 2015-12-09 DIAGNOSIS — I209 Angina pectoris, unspecified: Secondary | ICD-10-CM | POA: Diagnosis not present

## 2015-12-09 DIAGNOSIS — R0789 Other chest pain: Secondary | ICD-10-CM

## 2015-12-09 DIAGNOSIS — G8929 Other chronic pain: Secondary | ICD-10-CM | POA: Diagnosis not present

## 2015-12-09 DIAGNOSIS — I200 Unstable angina: Secondary | ICD-10-CM | POA: Insufficient documentation

## 2015-12-09 DIAGNOSIS — I2510 Atherosclerotic heart disease of native coronary artery without angina pectoris: Secondary | ICD-10-CM

## 2015-12-09 DIAGNOSIS — R079 Chest pain, unspecified: Secondary | ICD-10-CM | POA: Diagnosis present

## 2015-12-09 DIAGNOSIS — I2582 Chronic total occlusion of coronary artery: Secondary | ICD-10-CM | POA: Diagnosis not present

## 2015-12-09 DIAGNOSIS — I25110 Atherosclerotic heart disease of native coronary artery with unstable angina pectoris: Secondary | ICD-10-CM | POA: Diagnosis not present

## 2015-12-09 LAB — COMPREHENSIVE METABOLIC PANEL
ALBUMIN: 3.8 g/dL (ref 3.5–5.0)
ALK PHOS: 41 U/L (ref 38–126)
ALT: 16 U/L — ABNORMAL LOW (ref 17–63)
ANION GAP: 6 (ref 5–15)
AST: 33 U/L (ref 15–41)
BILIRUBIN TOTAL: 0.5 mg/dL (ref 0.3–1.2)
BUN: 17 mg/dL (ref 6–20)
CALCIUM: 8.8 mg/dL — AB (ref 8.9–10.3)
CO2: 34 mmol/L — ABNORMAL HIGH (ref 22–32)
Chloride: 103 mmol/L (ref 101–111)
Creatinine, Ser: 1.18 mg/dL (ref 0.61–1.24)
GFR calc Af Amer: >60 mL/min (ref >60)
GFR calc non Af Amer: >60 mL/min (ref >60)
GLUCOSE: 99 mg/dL (ref 65–99)
Potassium: 4.1 mmol/L (ref 3.5–5.1)
Sodium: 143 mmol/L (ref 135–145)
TOTAL PROTEIN: 6.6 g/dL (ref 6.5–8.1)

## 2015-12-09 LAB — BASIC METABOLIC PANEL
Anion gap: 9 (ref 5–15)
BUN: 11 mg/dL (ref 6–20)
CALCIUM: 8.8 mg/dL — AB (ref 8.9–10.3)
CHLORIDE: 101 mmol/L (ref 101–111)
CO2: 31 mmol/L (ref 22–32)
CREATININE: 1.15 mg/dL (ref 0.61–1.24)
GFR calc non Af Amer: >60 mL/min (ref >60)
GLUCOSE: 111 mg/dL — AB (ref 65–99)
Potassium: 3.4 mmol/L — ABNORMAL LOW (ref 3.5–5.1)
Sodium: 141 mmol/L (ref 135–145)

## 2015-12-09 LAB — CBC
HCT: 33.5 % — ABNORMAL LOW (ref 39.0–52.0)
HEMATOCRIT: 34.8 % — AB (ref 39.0–52.0)
HEMOGLOBIN: 11.4 g/dL — AB (ref 13.0–17.0)
Hemoglobin: 10.6 g/dL — ABNORMAL LOW (ref 13.0–17.0)
MCH: 31.3 pg (ref 26.0–34.0)
MCH: 29.9 pg (ref 26.0–34.0)
MCHC: 32.8 g/dL (ref 30.0–36.0)
MCHC: 31.6 g/dL (ref 30.0–36.0)
MCV: 95.6 fL (ref 78.0–100.0)
MCV: 94.4 fL (ref 78.0–100.0)
PLATELETS: 235 10*3/uL (ref 150–400)
Platelets: 253 10*3/uL (ref 150–400)
RBC: 3.64 MIL/uL — ABNORMAL LOW (ref 4.22–5.81)
RBC: 3.55 MIL/uL — ABNORMAL LOW (ref 4.22–5.81)
RDW: 12.9 % (ref 11.5–15.5)
RDW: 13.2 % (ref 11.5–15.5)
WBC: 5.6 10*3/uL (ref 4.0–10.5)
WBC: 4.7 10*3/uL (ref 4.0–10.5)

## 2015-12-09 LAB — PROTIME-INR
INR: 1.17 (ref 0.00–1.49)
PROTHROMBIN TIME: 15.1 s (ref 11.6–15.2)

## 2015-12-09 LAB — TROPONIN I
TROPONIN I: 0.03 ng/mL (ref <0.031)
Troponin I: 0.05 ng/mL — ABNORMAL HIGH (ref <0.031)

## 2015-12-09 LAB — HEPARIN LEVEL (UNFRACTIONATED): HEPARIN UNFRACTIONATED: 0.46 [IU]/mL (ref 0.30–0.70)

## 2015-12-09 MED ORDER — SODIUM CHLORIDE 0.9% FLUSH: 3.0000 mL | Freq: Two times a day (BID) | INTRAVENOUS | Status: DC

## 2015-12-09 MED ORDER — ENOXAPARIN SODIUM 40 MG/0.4ML ~~LOC~~ SOLN: 40.0000 mg | SUBCUTANEOUS | Status: DC

## 2015-12-09 MED ORDER — FENOFIBRATE 160 MG PO TABS
160.0000 mg | ORAL_TABLET | Freq: Every day | ORAL | Status: DC
  Administered 2015-12-09 – 2015-12-11 (×3): 160 mg via ORAL
  Filled 2015-12-09 (×3): qty 1

## 2015-12-09 MED ORDER — TAMSULOSIN HCL 0.4 MG PO CAPS: 0.4000 mg | ORAL_CAPSULE | Freq: Every day | ORAL | Status: DC

## 2015-12-09 MED ORDER — SODIUM CHLORIDE 0.9 % IV SOLN: 250.0000 mL | INTRAVENOUS | Status: DC | PRN

## 2015-12-09 MED ORDER — SODIUM CHLORIDE 0.9 % WEIGHT BASED INFUSION: 3.0000 mL/kg/h | INTRAVENOUS | Status: DC

## 2015-12-09 MED ORDER — ACETAMINOPHEN 325 MG PO TABS: 650.0000 mg | ORAL_TABLET | ORAL | Status: DC | PRN

## 2015-12-09 MED ORDER — SODIUM CHLORIDE 0.9 % WEIGHT BASED INFUSION: 1.0000 mL/kg/h | INTRAVENOUS | Status: DC

## 2015-12-09 MED ORDER — FUROSEMIDE 80 MG PO TABS: 80.0000 mg | ORAL_TABLET | Freq: Every day | ORAL | Status: DC

## 2015-12-09 MED ORDER — ASPIRIN 81 MG PO CHEW
81.0000 mg | CHEWABLE_TABLET | ORAL | Status: AC
  Administered 2015-12-10: 81 mg via ORAL
  Filled 2015-12-09: qty 1

## 2015-12-09 MED ORDER — ONDANSETRON HCL 4 MG/2ML IJ SOLN
4.0000 mg | Freq: Four times a day (QID) | INTRAMUSCULAR | Status: DC | PRN
Start: 2015-12-09 — End: 2015-12-10

## 2015-12-09 MED ORDER — MORPHINE SULFATE (PF) 2 MG/ML IV SOLN
2.0000 mg | INTRAVENOUS | Status: DC | PRN
  Administered 2015-12-09 – 2015-12-11 (×4): 2 mg via INTRAVENOUS
  Filled 2015-12-09 (×4): qty 1

## 2015-12-09 MED ORDER — SODIUM CHLORIDE 0.9% FLUSH: 3.0000 mL | INTRAVENOUS | Status: DC | PRN

## 2015-12-09 MED ORDER — PANTOPRAZOLE SODIUM 40 MG PO TBEC
40.0000 mg | DELAYED_RELEASE_TABLET | Freq: Every day | ORAL | Status: DC
  Administered 2015-12-09 – 2015-12-11 (×3): 40 mg via ORAL
  Filled 2015-12-09 (×3): qty 1

## 2015-12-09 MED ORDER — ALBUTEROL SULFATE (2.5 MG/3ML) 0.083% IN NEBU
3.0000 mL | INHALATION_SOLUTION | Freq: Four times a day (QID) | RESPIRATORY_TRACT | Status: DC | PRN
Start: 2015-12-09 — End: 2015-12-11

## 2015-12-09 MED ORDER — NITROGLYCERIN 0.4 MG SL SUBL: 0.4000 mg | SUBLINGUAL_TABLET | SUBLINGUAL | Status: DC | PRN

## 2015-12-09 MED ORDER — METOPROLOL SUCCINATE ER 25 MG PO TB24
25.0000 mg | ORAL_TABLET | Freq: Every day | ORAL | Status: DC
  Administered 2015-12-09 – 2015-12-11 (×2): 25 mg via ORAL
  Filled 2015-12-09 (×3): qty 1

## 2015-12-09 MED ORDER — HEPARIN BOLUS VIA INFUSION
4000.0000 [IU] | Freq: Once | INTRAVENOUS | Status: AC
  Administered 2015-12-09: 4000 [IU] via INTRAVENOUS
  Filled 2015-12-09: qty 4000

## 2015-12-09 MED ORDER — TAMSULOSIN HCL 0.4 MG PO CAPS
0.8000 mg | ORAL_CAPSULE | Freq: Every day | ORAL | Status: DC
  Administered 2015-12-09 – 2015-12-10 (×2): 0.8 mg via ORAL
  Filled 2015-12-09 (×2): qty 2

## 2015-12-09 MED ORDER — BUSPIRONE HCL 10 MG PO TABS
10.0000 mg | ORAL_TABLET | Freq: Three times a day (TID) | ORAL | Status: DC
  Administered 2015-12-09 – 2015-12-11 (×6): 10 mg via ORAL
  Filled 2015-12-09 (×7): qty 1
  Filled 2015-12-09: qty 2

## 2015-12-09 MED ORDER — ROSUVASTATIN CALCIUM 10 MG PO TABS
20.0000 mg | ORAL_TABLET | Freq: Every evening | ORAL | Status: DC
  Administered 2015-12-09 – 2015-12-10 (×2): 20 mg via ORAL
  Filled 2015-12-09 (×2): qty 2
  Filled 2015-12-09: qty 1
  Filled 2015-12-09: qty 2

## 2015-12-09 MED ORDER — TEMAZEPAM 15 MG PO CAPS
30.0000 mg | ORAL_CAPSULE | Freq: Every evening | ORAL | Status: DC | PRN
  Administered 2015-12-10 (×2): 30 mg via ORAL
  Filled 2015-12-09 (×2): qty 2

## 2015-12-09 MED ORDER — VENLAFAXINE HCL ER 75 MG PO CP24
150.0000 mg | ORAL_CAPSULE | Freq: Every day | ORAL | Status: DC
  Administered 2015-12-09 – 2015-12-11 (×3): 150 mg via ORAL
  Filled 2015-12-09 (×3): qty 2

## 2015-12-09 MED ORDER — GI COCKTAIL ~~LOC~~: 30.0000 mL | Freq: Four times a day (QID) | ORAL | Status: DC | PRN

## 2015-12-09 MED ORDER — POTASSIUM CHLORIDE CRYS ER 20 MEQ PO TBCR
40.0000 meq | EXTENDED_RELEASE_TABLET | ORAL | Status: AC
  Administered 2015-12-09 (×2): 40 meq via ORAL
  Filled 2015-12-09 (×2): qty 2

## 2015-12-09 MED ORDER — ASPIRIN EC 81 MG PO TBEC
81.0000 mg | DELAYED_RELEASE_TABLET | Freq: Every day | ORAL | Status: DC
  Administered 2015-12-09: 81 mg via ORAL
  Filled 2015-12-09 (×2): qty 1

## 2015-12-09 MED ORDER — SODIUM CHLORIDE 0.9 % IV SOLN
INTRAVENOUS | Status: DC
  Administered 2015-12-09: 01:00:00 via INTRAVENOUS

## 2015-12-09 MED ORDER — HEPARIN (PORCINE) IN NACL 100-0.45 UNIT/ML-% IJ SOLN
1200.0000 [IU]/h | INTRAMUSCULAR | Status: DC
Start: 2015-12-09 — End: 2015-12-10
  Administered 2015-12-09 – 2015-12-10 (×2): 1200 [IU]/h via INTRAVENOUS
  Filled 2015-12-09 (×2): qty 250

## 2015-12-09 MED ORDER — OXYCODONE HCL ER 20 MG PO T12A
60.0000 mg | EXTENDED_RELEASE_TABLET | Freq: Two times a day (BID) | ORAL | Status: DC
  Administered 2015-12-09 – 2015-12-11 (×6): 60 mg via ORAL
  Filled 2015-12-09 (×2): qty 6
  Filled 2015-12-09 (×2): qty 3
  Filled 2015-12-09 (×2): qty 6

## 2015-12-09 MED ORDER — METOPROLOL TARTRATE 25 MG PO TABS: 12.5000 mg | ORAL_TABLET | Freq: Two times a day (BID) | ORAL | Status: DC
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
## 2015-12-09 NOTE — Consult Note (Addendum)
Primary cardiologist: Dr Nona Dell Consulting cardiologist: Dr Dina Rich  Requesting physician: Dr Mauro Kaufmann Chief compliant: chest pain  Clinical Summary [name] is a 70 y.o.male history of HTN, HL, history o fchronic systolic HF now with normalized LVEF, EtOH abuse, COPD, ICD, CAD admitted with chest pain.   He reports a 1 week history of chest pain. Describes a nagging pain midchest, that has progressed in frequency and sevrity over the last week, now can get up to 9/10. Lasts approximately 2-3 minutes. Typically comes on with exertion, better with rest. Can be some positional component. Notes significant SOB/DOE over the same time period. Reports recently walking with wife up an incline, and having to stop several times due to chest pain and SOB, that resolved with rest. This was new for him, as he often walks this trail. He had stress test 03/2015 but reports symptoms have significantly progressed since that time. He also reports episode of syncope yesterday while walking. Had chest pain and SOB, states he thinks he just blacked out. Had multiple scrapes and bruises on his arms after fall.   10/2014 echo: LVEF 55-60%, no WMAs, normal diastolic function 03/2015 MPI: inferior scar with very mild peri-infarct ischemia K 3.1, Cr 1.28 (baseline 1.3), Hgb 12.4, Plt 286, trop 0.06, 0.05, 0.03 Cath 2012: LM 30% distal, LAD 30%, intermediate Lecretia Buczek 50-60%, LCX small and patent, occluded RCA with collaterals EKG SR, no ischemic changes  Allergies  Allergen Reactions  . Penicillins Hives    Medications Scheduled Medications: . aspirin EC  81 mg Oral Daily  . busPIRone  10 mg Oral TID  . enoxaparin (LOVENOX) injection  40 mg Subcutaneous Q24H  . fenofibrate  160 mg Oral Daily  . furosemide  80 mg Oral Daily  . metoprolol succinate  25 mg Oral Daily  . oxyCODONE  60 mg Oral BID  . pantoprazole  40 mg Oral Daily  . rosuvastatin  20 mg Oral QPM  . tamsulosin  0.4 mg Oral QPC  supper  . venlafaxine XR  150 mg Oral Daily     Infusions: . sodium chloride 10 mL/hr at 12/09/15 0126     PRN Medications:  acetaminophen, albuterol, gi cocktail, morphine injection, nitroGLYCERIN, ondansetron (ZOFRAN) IV, temazepam   Past Medical History  Diagnosis Date  . Essential hypertension, benign   . Mixed hyperlipidemia   . Cardiomyopathy     Mixed, LVEF 35% up to 50%  . H/O alcohol abuse   . Depression   . Obesity   . COPD (chronic obstructive pulmonary disease) (HCC)   . Osteoarthritis   . DDD (degenerative disc disease)   . GERD (gastroesophageal reflux disease)   . ACTH deficiency (HCC)     Dr. Lucianne Muss  . ICD (implantable cardiac defibrillator) in place   . Sleep apnea     Noncompliance with CPAP therapy  . Full dentures   . Wears glasses   . Coronary atherosclerosis of native coronary artery     Occluded RCA with good collaterals  . High cholesterol     Past Surgical History  Procedure Laterality Date  . Umbilical hernia repair    . Knee surgery    . Ankle surgery  right  . Femur surgery  right  . Nasal sinus surgery    . Icd implantation  06/18/2009    St Jude Fortify DR implanted by Dr. Johney Frame  . Transurethral resection of prostate  2013  . Back surgery  Multiple back surgeries for chronic back pain and lumbar fusion  . Foot arthrodesis Right 11/10/2012    Procedure: Right Hallux Metatarsal-phalangeal Joint Arthrodesis; Second Metatarsal Head Resection and Second, Third and Fourth Metatarsal Weil Osteotomy;  Surgeon: Toni Arthurs, MD;  Location: [facility];  Service: Orthopedics;  Laterality: Right;  . Hammer toe surgery Right 11/10/2012    Procedure: Right Second, Third, and Fourth Hammertoe Corrections;  Surgeon: Toni Arthurs, MD;  Location: [facility];  Service: Orthopedics;  Laterality: Right;    Family History  Problem Relation Age of Onset  . Coronary artery disease    . Arthritis      Social  History [name] reports that he quit smoking about 18 years ago. His smoking use included Cigarettes. He has a 36 pack-year smoking history. He has never used smokeless tobacco. [name] reports that he does not drink alcohol.  Review of Systems CONSTITUTIONAL: No weight loss, fever, chills, weakness or fatigue.  HEENT: Eyes: No visual loss, blurred vision, double vision or yellow sclerae. No hearing loss, sneezing, congestion, runny nose or sore throat.  SKIN: No rash or itching.  CARDIOVASCULAR: per HPI RESPIRATORY: No shortness of breath, cough or sputum.  GASTROINTESTINAL: No anorexia, nausea, vomiting or diarrhea. No abdominal pain or blood.  GENITOURINARY: no polyuria, no dysuria NEUROLOGICAL: syncope, dizziness MUSCULOSKELETAL: No muscle, back pain, joint pain or stiffness.  HEMATOLOGIC: No anemia, bleeding or bruising.  LYMPHATICS: No enlarged nodes. No history of splenectomy.  PSYCHIATRIC: No history of depression or anxiety.      Physical Examination Blood pressure 120/82, pulse 80, temperature 98 F (36.7 C), temperature source Oral, resp. rate 19, height 5\' 10"  (1.778 m), weight 186 lb 8.2 oz (84.6 kg), SpO2 100 %. No intake or output data in the 24 hours ending 12/09/15 0926  HEENT: sclera clear, throat clear  Cardiovascular: RRR, no m/r/g, no jvd  Respiratory: CTAB  GI: abdomen soft, NT, ND  MSK: no LE edema  Neuro: no focal deficits  Psych: appropriate affect   Lab Results  Basic Metabolic Panel:  Recent Labs Lab 12/08/15 2154 12/09/15 0712  NA 139 143  K 3.1* 4.1  CL 98* 103  CO2 31 34*  GLUCOSE 110* 99  BUN 18 17  CREATININE 1.28* 1.18  CALCIUM 8.9 8.8*    Liver Function Tests:  Recent Labs Lab 12/08/15 2154 12/09/15 0712  AST 36 33  ALT 17 16*  ALKPHOS 50 41  BILITOT 0.5 0.5  PROT 7.2 6.6  ALBUMIN 4.1 3.8    CBC:  Recent Labs Lab 12/08/15 2154 12/09/15 0712  WBC 6.5 5.6  NEUTROABS 4.8  --   HGB 12.4* 11.4*  HCT  37.2* 34.8*  MCV 94.4 95.6  PLT 286 253    Cardiac Enzymes:  Recent Labs Lab 12/08/15 2154 12/09/15 0134 12/09/15 0717  TROPONINI 0.06* 0.05* 0.03    BNP: Invalid input(s): POCBNP     Impression/Recommendations 1. Chest pain - symptoms concerning for unstable angina. Mild fairly flat troponin nonspecific, EKG without specific ischemic changes.  - 03/2015 stress test with inferior infarct and very mild peri-infarct ischemia. Symptoms have progressed since that time, with significant increase in chest pain and DOE over the last week - will plan on transfer to [facility] for cath for symptoms suggestive of unstable angina - medical therapy with ASA, beta blocker, statin. Will start heparin, he has multiple arm abrasions from fall and these will need to be monitored while  on heparin. If bp tolerates likely start ACE-I tomorrow.   Discussed with patient the risks/benefits of left heart catheterization +/- PCI. He was counsled on specific risks including bleeding, induced arrhythmias, or induced cardiac ischemia/infarction. He understands the risks and is willing to proceed   2. Syncope - episode of syncope while walking, had chest pain and SOB prior.  - Cr trend and downtrend in CBC cell lines with IVF would suggest he was dry, low K increases risk for arrhythmia.  - he does have history of EtOH abuse which could cause hypovolemia and hypoK, though he reports only drinking on the weekends.  - will order orthostatics - he will need his ICD checked as well.  - will hold lasix as he appears dry    Dina Rich, M.D.
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
## 2015-12-09 NOTE — Progress Notes (Signed)
ANTICOAGULATION CONSULT NOTE  Pharmacy Consult for HEPARIN Indication: chest pain/ACS  Allergies  Allergen Reactions  . Penicillins Hives   Patient Measurements: Height: 5\' 10"  (177.8 cm) Weight: 186 lb 8.2 oz (84.6 kg) IBW/kg (Calculated) : 73 HEPARIN DW (KG): 84.6  Vital Signs: Temp: 98.3 F (36.8 C) (04/10 1333) Temp Source: Oral (04/10 1333) BP: 134/77 mmHg (04/10 1333) Pulse Rate: 74 (04/10 1333)  Labs:  Recent Labs  12/08/15 2154 12/09/15 0134 12/09/15 0712 12/09/15 0717 12/09/15 1636  HGB 12.4*  --  11.4*  --  10.6*  HCT 37.2*  --  34.8*  --  33.5*  PLT 286  --  253  --  235  LABPROT  --   --   --   --  15.1  INR  --   --   --   --  1.17  HEPARINUNFRC  --   --   --   --  0.46  CREATININE 1.28*  --  1.18  --  1.15  TROPONINI 0.06* 0.05*  --  0.03  --    Estimated Creatinine Clearance: 62.6 mL/min (by C-G formula based on Cr of 1.15).  Medical History: Past Medical History  Diagnosis Date  . Essential hypertension, benign   . Mixed hyperlipidemia   . Cardiomyopathy     Mixed, LVEF 35% up to 50%  . H/O alcohol abuse   . Depression   . Obesity   . COPD (chronic obstructive pulmonary disease) (HCC)   . Osteoarthritis   . DDD (degenerative disc disease)   . GERD (gastroesophageal reflux disease)   . ACTH deficiency (HCC)     Dr. Lucianne Muss  . ICD (implantable cardiac defibrillator) in place   . Sleep apnea     Noncompliance with CPAP therapy  . Full dentures   . Wears glasses   . Coronary atherosclerosis of native coronary artery     Occluded RCA with good collaterals  . High cholesterol    Medications:  Prescriptions prior to admission  Medication Sig Dispense Refill Last Dose  . amitriptyline (ELAVIL) 50 MG tablet Take 50 mg by mouth at bedtime as needed.   UNKNOWN  . aspirin EC 81 MG tablet Take 81 mg by mouth daily.   12/08/2015 at Unknown time  . busPIRone (BUSPAR) 10 MG tablet Take 10 mg by mouth 3 (three) times daily.   12/08/2015 at Unknown time   . Cholecalciferol (VITAMIN D3) 5000 UNITS CAPS Take 1 capsule by mouth daily.   12/08/2015 at Unknown time  . Doxepin HCl 5 % CREA Apply 1 application topically daily as needed.   Past Month at Unknown time  . fenofibrate 160 MG tablet Take 160 mg by mouth daily.     12/08/2015 at Unknown time  . furosemide (LASIX) 80 MG tablet Take 80 mg by mouth daily.   12/08/2015 at Unknown time  . gabapentin (NEURONTIN) 300 MG capsule Take 600-1,200 mg by mouth 4 (four) times daily. 1 tablet three times a day and 2 at bedtime.   12/08/2015 at Unknown time  . metoprolol succinate (TOPROL-XL) 25 MG 24 hr tablet TAKE 1 TABLET BY MOUTH EVERY DAY 30 tablet 9 12/08/2015 at 800a  . omeprazole (PRILOSEC) 20 MG capsule Take 20 mg by mouth 2 (two) times daily.   12/08/2015 at Unknown time  . oxyCODONE (ROXICODONE) 15 MG immediate release tablet Take 15 mg by mouth 4 (four) times daily.    12/08/2015 at Unknown time  . OXYCONTIN 60  MG T12A Take 1 tablet by mouth 2 (two) times daily.    12/08/2015 at Unknown time  . PROAIR HFA 108 (90 Base) MCG/ACT inhaler Inhale 1-2 puffs into the lungs every 6 (six) hours as needed for wheezing or shortness of breath.    Past Week at Unknown time  . rosuvastatin (CRESTOR) 10 MG tablet Take 20 mg by mouth every evening.    12/08/2015 at Unknown time  . tamsulosin (FLOMAX) 0.4 MG CAPS Take 0.4 mg by mouth daily after supper.   12/08/2015 at Unknown time  . temazepam (RESTORIL) 30 MG capsule Take 30 mg by mouth at bedtime as needed for sleep.   12/07/2015 at Unknown time  . venlafaxine XR (EFFEXOR-XR) 150 MG 24 hr capsule Take 150 mg by mouth daily.   12/08/2015 at Unknown time   Assessment: 70yo male with c/o chest pain.  Asked to initiate Heparin for ACS, unstable angina. Heparin level this evening is therapeutic at 0.46, will continue current rate and check confirmatory 6 hour level.  Goal of Therapy:  Heparin level 0.3-0.7 units/ml Monitor platelets by anticoagulation protocol: Yes   Plan:   Continue  Heparin infusion at 1200 units/hr  Heparin level in 6-8 hrs then daily  CBC daily while on Heparin   Thank you for allowing [place] to participate in this patients care. Signe Colt, PharmD Pager: [phone]  12/09/2015,6:08 PM

## 2015-12-09 NOTE — Progress Notes (Signed)
Call placed to attending, notified attending of Pt c/o urinary retention.  Bladder scan positive for 433 ml in bladder.  Order received to place foley.

## 2015-12-09 NOTE — Care Management Obs Status (Signed)
MEDICARE OBSERVATION STATUS NOTIFICATION   Patient Details  Name: [name] MRN: [id number] Date of Birth: Apr 18, 1946   Medicare Observation Status Notification Given:  Yes    Adonis Huguenin, RN 12/09/2015, 11:33 AM

## 2015-12-09 NOTE — Progress Notes (Signed)
ANTICOAGULATION CONSULT NOTE - Initial Consult  Pharmacy Consult for HEPARIN Indication: chest pain/ACS  Allergies  Allergen Reactions  . Penicillins Hives   Patient Measurements: Height:  (177.8 cm) Weight: 186 lb 8.2 oz (84.6 kg) IBW/kg (Calculated) : 73 HEPARIN DW (KG): 84.6  Vital Signs: Temp: 98 F (36.7 C) (04/10 0410) Temp Source: Oral (04/10 0410) BP: 120/82 mmHg (04/10 0410) Pulse Rate: 80 (04/10 0410)  Labs:  Recent Labs  12/08/15 2154 12/09/15 0134 12/09/15 0712 12/09/15 0717  HGB 12.4*  --  11.4*  --   HCT 37.2*  --  34.8*  --   PLT 286  --  253  --   CREATININE 1.28*  --  1.18  --   TROPONINI 0.06* 0.05*  --  0.03   Estimated Creatinine Clearance: 61 mL/min (by C-G formula based on Cr of 1.18).  Medical History: Past Medical History  Diagnosis Date  . Essential hypertension, benign   . Mixed hyperlipidemia   . Cardiomyopathy     Mixed, LVEF 35% up to 50%  . H/O alcohol abuse   . Depression   . Obesity   . COPD (chronic obstructive pulmonary disease) (HCC)   . Osteoarthritis   . DDD (degenerative disc disease)   . GERD (gastroesophageal reflux disease)   . ACTH deficiency (HCC)     Dr. Lucianne Muss  . ICD (implantable cardiac defibrillator) in place   . Sleep apnea     Noncompliance with CPAP therapy  . Full dentures   . Wears glasses   . Coronary atherosclerosis of native coronary artery     Occluded RCA with good collaterals  . High cholesterol    Medications:  Prescriptions prior to admission  Medication Sig Dispense Refill Last Dose  . amitriptyline (ELAVIL) 50 MG tablet Take 50 mg by mouth at bedtime as needed.   UNKNOWN  . aspirin EC 81 MG tablet Take 81 mg by mouth daily.   12/08/2015 at Unknown time  . busPIRone (BUSPAR) 10 MG tablet Take 10 mg by mouth 3 (three) times daily.   12/08/2015 at Unknown time  . Cholecalciferol (VITAMIN D3) 5000 UNITS CAPS Take 1 capsule by mouth daily.   12/08/2015 at Unknown time  . Doxepin HCl 5 % CREA  Apply 1 application topically daily as needed.   Past Month at Unknown time  . fenofibrate 160 MG tablet Take 160 mg by mouth daily.     12/08/2015 at Unknown time  . furosemide (LASIX) 80 MG tablet Take 80 mg by mouth daily.   12/08/2015 at Unknown time  . gabapentin (NEURONTIN) 300 MG capsule Take 600-1,200 mg by mouth 4 (four) times daily. 1 tablet three times a day and 2 at bedtime.   12/08/2015 at Unknown time  . metoprolol succinate (TOPROL-XL) 25 MG 24 hr tablet TAKE 1 TABLET BY MOUTH EVERY DAY 30 tablet 9 12/08/2015 at 800a  . omeprazole (PRILOSEC) 20 MG capsule Take 20 mg by mouth 2 (two) times daily.   12/08/2015 at Unknown time  . oxyCODONE (ROXICODONE) 15 MG immediate release tablet Take 15 mg by mouth 4 (four) times daily.    12/08/2015 at Unknown time  . OXYCONTIN 60 MG T12A Take 1 tablet by mouth 2 (two) times daily.    12/08/2015 at Unknown time  . PROAIR HFA 108 (90 Base) MCG/ACT inhaler Inhale 1-2 puffs into the lungs every 6 (six) hours as needed for wheezing or shortness of breath.    Past Week at  Unknown time  . rosuvastatin (CRESTOR) 10 MG tablet Take 20 mg by mouth every evening.    12/08/2015 at Unknown time  . tamsulosin (FLOMAX) 0.4 MG CAPS Take 0.4 mg by mouth daily after supper.   12/08/2015 at Unknown time  . temazepam (RESTORIL) 30 MG capsule Take 30 mg by mouth at bedtime as needed for sleep.   12/07/2015 at Unknown time  . venlafaxine XR (EFFEXOR-XR) 150 MG 24 hr capsule Take 150 mg by mouth daily.   12/08/2015 at Unknown time   Assessment: 70yo male with c/o chest pain.  Asked to initiate Heparin for ACS, unstable angina.  Goal of Therapy:  Heparin level 0.3-0.7 units/ml Monitor platelets by anticoagulation protocol: Yes   Plan:   Heparin 4000 units IV bolus now x 1  Heparin infusion at 1200 units/hr  Heparin level in 6-8 hrs then daily  CBC daily while on Heparin  [name] A 12/09/2015,10:09 AM

## 2015-12-09 NOTE — Progress Notes (Signed)
Pt arrived to unit as transfer from [facility].  Pt placed on telemetry and CCMD notified. Pt with IV fluids, normal saline and heparin running and validated with MAR.  Pt with multiple abrasions and skin tears to feet, arms and side of abdomen.  Skin tears cleansed and tegaderms applied as needed.  Pt c/o of being unable to urinate d/t Pt daily lasix held.  Will call attending for further orders.
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
## 2015-12-09 NOTE — Plan of Care (Signed)
Problem: Education: Goal: Understanding of cardiac disease, CV risk reduction, and recovery process will improve Outcome: Progressing Coronary angiogram information given to Pt.

## 2015-12-10 ENCOUNTER — Encounter (HOSPITAL_COMMUNITY): Payer: Self-pay | Admitting: Cardiovascular Disease

## 2015-12-10 ENCOUNTER — Encounter (HOSPITAL_COMMUNITY)
Admission: EM | Disposition: A | Discharge status: Home / Self Care | Payer: Self-pay | Source: Home / Self Care | Attending: Emergency Medicine

## 2015-12-10 DIAGNOSIS — I200 Unstable angina: Secondary | ICD-10-CM | POA: Diagnosis not present

## 2015-12-10 DIAGNOSIS — J438 Other emphysema: Secondary | ICD-10-CM | POA: Diagnosis not present

## 2015-12-10 DIAGNOSIS — I25118 Atherosclerotic heart disease of native coronary artery with other forms of angina pectoris: Secondary | ICD-10-CM | POA: Diagnosis not present

## 2015-12-10 DIAGNOSIS — Z95810 Presence of automatic (implantable) cardiac defibrillator: Secondary | ICD-10-CM | POA: Diagnosis not present

## 2015-12-10 HISTORY — PX: CARDIAC CATHETERIZATION: SHX172

## 2015-12-10 LAB — CBC
HEMATOCRIT: 32.3 % — AB (ref 39.0–52.0)
HEMOGLOBIN: 10.2 g/dL — AB (ref 13.0–17.0)
MCH: 29.9 pg (ref 26.0–34.0)
MCHC: 31.6 g/dL (ref 30.0–36.0)
MCV: 94.7 fL (ref 78.0–100.0)
Platelets: 210 10*3/uL (ref 150–400)
RBC: 3.41 MIL/uL — AB (ref 4.22–5.81)
RDW: 13.3 % (ref 11.5–15.5)
WBC: 4.4 10*3/uL (ref 4.0–10.5)

## 2015-12-10 LAB — BASIC METABOLIC PANEL
Anion gap: 10 (ref 5–15)
BUN: 10 mg/dL (ref 6–20)
CHLORIDE: 101 mmol/L (ref 101–111)
CO2: 30 mmol/L (ref 22–32)
Calcium: 8.9 mg/dL (ref 8.9–10.3)
Creatinine, Ser: 1.19 mg/dL (ref 0.61–1.24)
GFR calc Af Amer: >60 mL/min (ref >60)
GFR calc non Af Amer: >60 mL/min (ref >60)
Glucose, Bld: 118 mg/dL — ABNORMAL HIGH (ref 65–99)
POTASSIUM: 3.9 mmol/L (ref 3.5–5.1)
SODIUM: 141 mmol/L (ref 135–145)

## 2015-12-10 LAB — MAGNESIUM: MAGNESIUM: 1.9 mg/dL (ref 1.7–2.4)

## 2015-12-10 LAB — HEPARIN LEVEL (UNFRACTIONATED)
Heparin Unfractionated: 0.41 IU/mL (ref 0.30–0.70)
Heparin Unfractionated: 0.43 IU/mL (ref 0.30–0.70)

## 2015-12-10 LAB — POCT ACTIVATED CLOTTING TIME: ACTIVATED CLOTTING TIME: 131 s

## 2015-12-10 SURGERY — LEFT HEART CATH AND CORONARY ANGIOGRAPHY

## 2015-12-10 MED ORDER — GABAPENTIN 300 MG PO CAPS
300.0000 mg | ORAL_CAPSULE | Freq: Three times a day (TID) | ORAL | Status: DC
  Administered 2015-12-10 – 2015-12-11 (×2): 300 mg via ORAL
  Filled 2015-12-10 (×2): qty 1

## 2015-12-10 MED ORDER — IOPAMIDOL (ISOVUE-370) INJECTION 76%
INTRAVENOUS | Status: AC
  Filled 2015-12-10: qty 100

## 2015-12-10 MED ORDER — HEPARIN SODIUM (PORCINE) 5000 UNIT/ML IJ SOLN
5000.0000 [IU] | Freq: Three times a day (TID) | INTRAMUSCULAR | Status: DC
  Administered 2015-12-11: 5000 [IU] via SUBCUTANEOUS
  Filled 2015-12-10: qty 1

## 2015-12-10 MED ORDER — ASPIRIN 81 MG PO CHEW
81.0000 mg | CHEWABLE_TABLET | Freq: Every day | ORAL | Status: DC
  Administered 2015-12-11: 81 mg via ORAL
  Filled 2015-12-10: qty 1

## 2015-12-10 MED ORDER — SODIUM CHLORIDE 0.9 % IV SOLN: 250.0000 mL | INTRAVENOUS | Status: DC | PRN

## 2015-12-10 MED ORDER — HEPARIN (PORCINE) IN NACL 2-0.9 UNIT/ML-% IJ SOLN
INTRAMUSCULAR | Status: DC | PRN
  Administered 2015-12-10: 1500 mL

## 2015-12-10 MED ORDER — POLYETHYLENE GLYCOL 3350 17 G PO PACK
17.0000 g | PACK | Freq: Every day | ORAL | Status: DC
  Administered 2015-12-11: 17 g via ORAL
  Filled 2015-12-10: qty 1

## 2015-12-10 MED ORDER — ACETAMINOPHEN 325 MG PO TABS: 650.0000 mg | ORAL_TABLET | ORAL | Status: DC | PRN

## 2015-12-10 MED ORDER — HEPARIN (PORCINE) IN NACL 2-0.9 UNIT/ML-% IJ SOLN
INTRAMUSCULAR | Status: AC
  Filled 2015-12-10: qty 1500

## 2015-12-10 MED ORDER — SODIUM CHLORIDE 0.9% FLUSH: 3.0000 mL | INTRAVENOUS | Status: DC | PRN

## 2015-12-10 MED ORDER — ONDANSETRON HCL 4 MG/2ML IJ SOLN: 4.0000 mg | Freq: Four times a day (QID) | INTRAMUSCULAR | Status: DC | PRN

## 2015-12-10 MED ORDER — GABAPENTIN 300 MG PO CAPS
600.0000 mg | ORAL_CAPSULE | Freq: Every day | ORAL | Status: DC
  Administered 2015-12-10: 600 mg via ORAL
  Filled 2015-12-10 (×2): qty 2

## 2015-12-10 MED ORDER — LIDOCAINE HCL (PF) 1 % IJ SOLN
INTRAMUSCULAR | Status: DC | PRN
  Administered 2015-12-10: 16 mL

## 2015-12-10 MED ORDER — SODIUM CHLORIDE 0.9 % IV SOLN
INTRAVENOUS | Status: DC
  Administered 2015-12-10: 80 mL/h via INTRAVENOUS

## 2015-12-10 MED ORDER — SODIUM CHLORIDE 0.9% FLUSH
3.0000 mL | Freq: Two times a day (BID) | INTRAVENOUS | Status: DC
  Administered 2015-12-11: 3 mL via INTRAVENOUS

## 2015-12-10 MED ORDER — MIDAZOLAM HCL 2 MG/2ML IJ SOLN
INTRAMUSCULAR | Status: AC
  Filled 2015-12-10: qty 2

## 2015-12-10 MED ORDER — SENNOSIDES-DOCUSATE SODIUM 8.6-50 MG PO TABS
2.0000 | ORAL_TABLET | Freq: Two times a day (BID) | ORAL | Status: DC
  Administered 2015-12-10 – 2015-12-11 (×2): 2 via ORAL
  Filled 2015-12-10 (×2): qty 2

## 2015-12-10 MED ORDER — GABAPENTIN 300 MG PO CAPS: 600.0000 mg | ORAL_CAPSULE | Freq: Four times a day (QID) | ORAL | Status: DC

## 2015-12-10 MED ORDER — FENTANYL CITRATE (PF) 100 MCG/2ML IJ SOLN
INTRAMUSCULAR | Status: DC | PRN
  Administered 2015-12-10: 25 ug via INTRAVENOUS

## 2015-12-10 MED ORDER — MIDAZOLAM HCL 2 MG/2ML IJ SOLN
INTRAMUSCULAR | Status: DC | PRN
Start: 2015-12-10 — End: 2015-12-10
  Administered 2015-12-10: 2 mg via INTRAVENOUS

## 2015-12-10 MED ORDER — FENTANYL CITRATE (PF) 100 MCG/2ML IJ SOLN
INTRAMUSCULAR | Status: AC
  Filled 2015-12-10: qty 2

## 2015-12-10 MED ORDER — LIDOCAINE HCL (PF) 1 % IJ SOLN
INTRAMUSCULAR | Status: AC
  Filled 2015-12-10: qty 30

## 2015-12-10 MED ORDER — OXYCODONE HCL 5 MG PO TABS
10.0000 mg | ORAL_TABLET | Freq: Four times a day (QID) | ORAL | Status: DC | PRN
  Administered 2015-12-10 – 2015-12-11 (×3): 10 mg via ORAL
  Filled 2015-12-10 (×3): qty 2

## 2015-12-10 MED ORDER — DIAZEPAM 5 MG PO TABS: 5.0000 mg | ORAL_TABLET | ORAL | Status: DC | PRN

## 2015-12-10 MED ORDER — IOPAMIDOL (ISOVUE-370) INJECTION 76%
INTRAVENOUS | Status: DC | PRN
  Administered 2015-12-10: 95 mL via INTRAVENOUS

## 2015-12-10 SURGICAL SUPPLY — 8 items
CATH INFINITI MULTIPACK ST 5F (CATHETERS) ×1 IMPLANT
CATH SITESEER 5F NTR (CATHETERS) ×1 IMPLANT
KIT HEART LEFT (KITS) ×2 IMPLANT
PACK CARDIAC CATHETERIZATION (CUSTOM PROCEDURE TRAY) ×2 IMPLANT
SHEATH PINNACLE 5F 10CM (SHEATH) ×1 IMPLANT
SYR MEDRAD MARK V 150ML (SYRINGE) ×2 IMPLANT
TRANSDUCER W/STOPCOCK (MISCELLANEOUS) ×2 IMPLANT
WIRE EMERALD 3MM-J .035X150CM (WIRE) ×2 IMPLANT

## 2015-12-10 NOTE — H&P (View-Only) (Signed)
Primary cardiologist: Dr Nona Dell Consulting cardiologist: Dr Dina Rich  Requesting physician: Dr Mauro Kaufmann Chief compliant: chest pain  Clinical Summary [name] is a 70 y.o.male history of HTN, HL, history o fchronic systolic HF now with normalized LVEF, EtOH abuse, COPD, ICD, CAD admitted with chest pain.   He reports a 1 week history of chest pain. Describes a nagging pain midchest, that has progressed in frequency and sevrity over the last week, now can get up to 9/10. Lasts approximately 2-3 minutes. Typically comes on with exertion, better with rest. Can be some positional component. Notes significant SOB/DOE over the same time period. Reports recently walking with wife up an incline, and having to stop several times due to chest pain and SOB, that resolved with rest. This was new for him, as he often walks this trail. He had stress test 03/2015 but reports symptoms have significantly progressed since that time. He also reports episode of syncope yesterday while walking. Had chest pain and SOB, states he thinks he just blacked out. Had multiple scrapes and bruises on his arms after fall.   10/2014 echo: LVEF 55-60%, no WMAs, normal diastolic function 03/2015 MPI: inferior scar with very mild peri-infarct ischemia K 3.1, Cr 1.28 (baseline 1.3), Hgb 12.4, Plt 286, trop 0.06, 0.05, 0.03 Cath 2012: LM 30% distal, LAD 30%, intermediate branch 50-60%, LCX small and patent, occluded RCA with collaterals EKG SR, no ischemic changes  Allergies  Allergen Reactions  . Penicillins Hives    Medications Scheduled Medications: . aspirin EC  81 mg Oral Daily  . busPIRone  10 mg Oral TID  . enoxaparin (LOVENOX) injection  40 mg Subcutaneous Q24H  . fenofibrate  160 mg Oral Daily  . furosemide  80 mg Oral Daily  . metoprolol succinate  25 mg Oral Daily  . oxyCODONE  60 mg Oral BID  . pantoprazole  40 mg Oral Daily  . rosuvastatin  20 mg Oral QPM  . tamsulosin  0.4 mg Oral QPC  supper  . venlafaxine XR  150 mg Oral Daily     Infusions: . sodium chloride 10 mL/hr at 12/09/15 0126     PRN Medications:  acetaminophen, albuterol, gi cocktail, morphine injection, nitroGLYCERIN, ondansetron (ZOFRAN) IV, temazepam   Past Medical History  Diagnosis Date  . Essential hypertension, benign   . Mixed hyperlipidemia   . Cardiomyopathy     Mixed, LVEF 35% up to 50%  . H/O alcohol abuse   . Depression   . Obesity   . COPD (chronic obstructive pulmonary disease) (HCC)   . Osteoarthritis   . DDD (degenerative disc disease)   . GERD (gastroesophageal reflux disease)   . ACTH deficiency (HCC)     Dr. Lucianne Muss  . ICD (implantable cardiac defibrillator) in place   . Sleep apnea     Noncompliance with CPAP therapy  . Full dentures   . Wears glasses   . Coronary atherosclerosis of native coronary artery     Occluded RCA with good collaterals  . High cholesterol     Past Surgical History  Procedure Laterality Date  . Umbilical hernia repair    . Knee surgery    . Ankle surgery  right  . Femur surgery  right  . Nasal sinus surgery    . Icd implantation  06/18/2009    St Jude Fortify DR implanted by Dr. Johney Frame  . Transurethral resection of prostate  2013  . Back surgery  Multiple back surgeries for chronic back pain and lumbar fusion  . Foot arthrodesis Right 11/10/2012    Procedure: Right Hallux Metatarsal-phalangeal Joint Arthrodesis; Second Metatarsal Head Resection and Second, Third and Fourth Metatarsal Weil Osteotomy;  Surgeon: Toni Arthurs, MD;  Location: [facility];  Service: Orthopedics;  Laterality: Right;  . Hammer toe surgery Right 11/10/2012    Procedure: Right Second, Third, and Fourth Hammertoe Corrections;  Surgeon: Toni Arthurs, MD;  Location: [facility];  Service: Orthopedics;  Laterality: Right;    Family History  Problem Relation Age of Onset  . Coronary artery disease    . Arthritis      Social  History [name] reports that he quit smoking about 18 years ago. His smoking use included Cigarettes. He has a 36 pack-year smoking history. He has never used smokeless tobacco. [name] reports that he does not drink alcohol.  Review of Systems CONSTITUTIONAL: No weight loss, fever, chills, weakness or fatigue.  HEENT: Eyes: No visual loss, blurred vision, double vision or yellow sclerae. No hearing loss, sneezing, congestion, runny nose or sore throat.  SKIN: No rash or itching.  CARDIOVASCULAR: per HPI RESPIRATORY: No shortness of breath, cough or sputum.  GASTROINTESTINAL: No anorexia, nausea, vomiting or diarrhea. No abdominal pain or blood.  GENITOURINARY: no polyuria, no dysuria NEUROLOGICAL: syncope, dizziness MUSCULOSKELETAL: No muscle, back pain, joint pain or stiffness.  HEMATOLOGIC: No anemia, bleeding or bruising.  LYMPHATICS: No enlarged nodes. No history of splenectomy.  PSYCHIATRIC: No history of depression or anxiety.      Physical Examination Blood pressure 120/82, pulse 80, temperature 98 F (36.7 C), temperature source Oral, resp. rate 19, height 5\' 10"  (1.778 m), weight 186 lb 8.2 oz (84.6 kg), SpO2 100 %. No intake or output data in the 24 hours ending 12/09/15 0926  HEENT: sclera clear, throat clear  Cardiovascular: RRR, no m/r/g, no jvd  Respiratory: CTAB  GI: abdomen soft, NT, ND  MSK: no LE edema  Neuro: no focal deficits  Psych: appropriate affect   Lab Results  Basic Metabolic Panel:  Recent Labs Lab 12/08/15 2154 12/09/15 0712  NA 139 143  K 3.1* 4.1  CL 98* 103  CO2 31 34*  GLUCOSE 110* 99  BUN 18 17  CREATININE 1.28* 1.18  CALCIUM 8.9 8.8*    Liver Function Tests:  Recent Labs Lab 12/08/15 2154 12/09/15 0712  AST 36 33  ALT 17 16*  ALKPHOS 50 41  BILITOT 0.5 0.5  PROT 7.2 6.6  ALBUMIN 4.1 3.8    CBC:  Recent Labs Lab 12/08/15 2154 12/09/15 0712  WBC 6.5 5.6  NEUTROABS 4.8  --   HGB 12.4* 11.4*  HCT  37.2* 34.8*  MCV 94.4 95.6  PLT 286 253    Cardiac Enzymes:  Recent Labs Lab 12/08/15 2154 12/09/15 0134 12/09/15 0717  TROPONINI 0.06* 0.05* 0.03    BNP: Invalid input(s): POCBNP     Impression/Recommendations 1. Chest pain - symptoms concerning for unstable angina. Mild fairly flat troponin nonspecific, EKG without specific ischemic changes.  - 03/2015 stress test with inferior infarct and very mild peri-infarct ischemia. Symptoms have progressed since that time, with significant increase in chest pain and DOE over the last week - will plan on transfer to [facility] for cath for symptoms suggestive of unstable angina - medical therapy with ASA, beta blocker, statin. Will start heparin, he has multiple arm abrasions from fall and these will need to be monitored while  on heparin. If bp tolerates likely start ACE-I tomorrow.   Discussed with patient the risks/benefits of left heart catheterization +/- PCI. He was counsled on specific risks including bleeding, induced arrhythmias, or induced cardiac ischemia/infarction. He understands the risks and is willing to proceed   2. Syncope - episode of syncope while walking, had chest pain and SOB prior.  - Cr trend and downtrend in CBC cell lines with IVF would suggest he was dry, low K increases risk for arrhythmia.  - he does have history of EtOH abuse which could cause hypovolemia and hypoK, though he reports only drinking on the weekends.  - will order orthostatics - he will need his ICD checked as well.  - will hold lasix as he appears dry    Dina Rich, M.D.

## 2015-12-10 NOTE — Progress Notes (Signed)
Pt received from cath lab. R groin has dry dressing in place, soft to touch. Pt VSS, call light within reach, pt understands bed rest. Will continue to monitor.   Leonidas Romberg, RN

## 2015-12-10 NOTE — Progress Notes (Addendum)
Site area: RFA  Site Prior to Removal:  Level 0 Pressure Applied For:20 min Manual:  yes  Patient Status During Pull:  stable Post Pull Site:  Level 0 Post Pull Instructions Given:  yes Post Pull Pulses Present: palpable Dressing Applied: tegaderm  Bedrest begins @ 1200 till 1600 Comments:

## 2015-12-10 NOTE — Progress Notes (Signed)
PROGRESS NOTE  [name] [id number] DOB: 1946/07/27 DOA: 12/08/2015 PCP: Samuel Jester, DO  HPI/Recap of past 24 hours:  Reported chronic arthritic pain, denies chest pain, no sob  Assessment/Plan: Active Problems:   CAD, NATIVE VESSEL   Automatic implantable cardioverter-defibrillator in situ   Dyspnea   Chest pain   Unstable angina (HCC)   Chronic obstructive pulmonary disease (HCC)  Chest pain , possible unstable angina, on heparin drip, to have cardiac cath at [facility] patient presenting with symptoms of exertional angina, mild elevation of troponin. Morphine when necessary for pain. Aspirin, metoprolol, statin. Start nitroglycerin when 0.4 mg sublingual every 5 minutes 3 Patient was admitted to [facility] and transferred to [facility] on 4/10 for cardiac cath on 4/11, cardiology input appreciated.  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 Currently euvolemic, home meds lasix held,   COPD: quit smoking, stable, not in exacerbation.  Bph, urinary retention: foley, flomax  DVT prophylaxis On heparin drip  Code Status: full  Family Communication: patient   Disposition Plan: likely home on 4/12, pending cardiology clearance  Consultants:  cardiology  Procedures:  Cardiac cath 4/11  Antibiotics:  none   Objective: BP 106/53 mmHg  Pulse 66  Temp(Src) 98.1 F (36.7 C) (Oral)  Resp 18  Ht  (1.778 m)  Wt 84.6 kg (186 lb 8.2 oz)  BMI 26.76 kg/m2  SpO2 100%  Intake/Output Summary (Last 24 hours) at 12/10/15 1511 Last data filed at 12/10/15 1000  Gross per 24 hour  Intake    240 ml  Output   1750 ml  Net  -1510 ml   Filed Weights   12/08/15 2103 12/09/15 0047  Weight: 86.183 kg (190 lb) 84.6 kg (186 lb 8.2 oz)    Exam:   General:  NAD  Cardiovascular: RRR  Respiratory: overall  diminished, but no wheezing, no rales, no rhonchi  Abdomen: Soft/ND/NT, positive BS, superficial laceration on left abdomen. No bleeding  Musculoskeletal: No Edema  Neuro: aaox3  Data Reviewed: Basic Metabolic Panel:  Recent Labs Lab 12/08/15 2154 12/09/15 0712 12/09/15 1636 12/10/15 0318  NA 139 143 141 141  K 3.1* 4.1 3.4* 3.9  CL 98* 103 101 101  CO2 31 34* 31 30  GLUCOSE 110* 99 111* 118*  BUN CREATININE 1.28* 1.18 1.15 1.19  CALCIUM 8.9 8.8* 8.8* 8.9  MG  --   --   --  1.9   Liver Function Tests:  Recent Labs Lab 12/08/15 2154 12/09/15 0712  AST 36 33  ALT 17 16*  ALKPHOS 50 41  BILITOT 0.5 0.5  PROT 7.2 6.6  ALBUMIN 4.1 3.8   No results for input(s): LIPASE, AMYLASE in the last 168 hours. No results for input(s): AMMONIA in the last 168 hours. CBC:  Recent Labs Lab 12/08/15 2154 12/09/15 0712 12/09/15 1636 12/10/15 0318  WBC 6.5 5.6 4.7 4.4  NEUTROABS 4.8  --   --   --   HGB 12.4* 11.4* 10.6* 10.2*  HCT 37.2* 34.8* 33.5* 32.3*  MCV 94.4 95.6 94.4 94.7  PLT 286 253 235 210   Cardiac Enzymes:    Recent Labs Lab 12/08/15 2154 12/09/15 0134 12/09/15 0717  TROPONINI 0.06* 0.05* 0.03   BNP (last 3 results) No results for input(s): BNP in the last 8760 hours.  ProBNP (last 3 results) No results  for input(s): PROBNP in the last 8760 hours.  CBG: No results for input(s): GLUCAP in the last 168 hours.  No results found for this or any previous visit (from the past 240 hour(s)).   Studies: No results found.  Scheduled Meds: . aspirin  81 mg Oral Daily  . busPIRone  10 mg Oral TID  . fenofibrate  160 mg Oral Daily  . [START ON 12/11/2015] heparin  5,000 Units Subcutaneous 3 times per day  . metoprolol succinate  25 mg Oral Daily  . oxyCODONE  60 mg Oral BID  . pantoprazole  40 mg Oral Daily  . polyethylene glycol  17 g Oral Daily  . rosuvastatin  20 mg Oral QPM  . senna-docusate  2 tablet Oral BID  . sodium chloride  flush  3 mL Intravenous Q12H  . tamsulosin  0.8 mg Oral QPC supper  . venlafaxine XR  150 mg Oral Daily    Continuous Infusions: . sodium chloride 10 mL/hr at 12/09/15 0126  . sodium chloride 1,000 mL (12/10/15 1137)     Time spent:  [name] MD, PhD  [facility] Pager [phone]. If 7PM-7AM, please contact night-coverage at [email or web address], password [facility] 12/10/2015, 3:11 PM

## 2015-12-10 NOTE — Interval H&P Note (Signed)
Cath Lab Visit (complete for each Cath Lab visit)  Clinical Evaluation Leading to the Procedure:   ACS: No.  Non-ACS:    Anginal Classification: CCS III  Anti-ischemic medical therapy: Maximal Therapy (2 or more classes of medications)  Non-Invasive Test Results: No non-invasive testing performed  Prior CABG: No previous CABG      History and Physical Interval Note:  12/10/2015 10:35 AM  [name]  has presented today for surgery, with the diagnosis of cp  The various methods of treatment have been discussed with the patient and family. After consideration of risks, benefits and other options for treatment, the patient has consented to  Procedure(s): Left Heart Cath and Coronary Angiography (N/A) as a surgical intervention .  The patient's history has been reviewed, patient examined, no change in status, stable for surgery.  I have reviewed the patient's chart and labs.  Questions were answered to the patient's satisfaction.     KELLY,THOMAS A

## 2015-12-10 NOTE — Progress Notes (Signed)
PT Cancellation Note  Patient Details Name: [name] MRN: [id number] DOB: 1945/12/24   Cancelled Treatment:    Reason Eval/Treat Not Completed: Patient not medically ready Pt just got back from cardiac cath and now on bedrest. Will follow up.   Blake Divine A Nusaybah Ivie 12/10/2015, 2:58 PM Mylo Red, PT, DPT [phone]

## 2015-12-10 NOTE — Progress Notes (Signed)
ANTICOAGULATION CONSULT NOTE  Pharmacy Consult for HEPARIN Indication: chest pain/ACS  Allergies  Allergen Reactions  . Penicillins Hives   Patient Measurements: Height: 5\' 10"  (177.8 cm) Weight: 186 lb 8.2 oz (84.6 kg) IBW/kg (Calculated) : 73 HEPARIN DW (KG): 84.6  Vital Signs: Temp: 98.4 F (36.9 C) (04/10 2047) Temp Source: Oral (04/10 2047) BP: 126/72 mmHg (04/10 2047) Pulse Rate: 72 (04/10 2047)  Labs:  Recent Labs  12/08/15 2154 12/09/15 0134 12/09/15 0712 12/09/15 0717 12/09/15 1636 12/09/15 2351  HGB 12.4*  --  11.4*  --  10.6*  --   HCT 37.2*  --  34.8*  --  33.5*  --   PLT 286  --  253  --  235  --   LABPROT  --   --   --   --  15.1  --   INR  --   --   --   --  1.17  --   HEPARINUNFRC  --   --   --   --  0.46 0.41  CREATININE 1.28*  --  1.18  --  1.15  --   TROPONINI 0.06* 0.05*  --  0.03  --   --    Estimated Creatinine Clearance: 62.6 mL/min (by C-G formula based on Cr of 1.15).  Assessment: 70yo male on heparin for r/o ACS. Heparin level remains therapeutic on 1200 units/hr. No bleeding noted even though Hgb trending down. Plans for cath 4/11.  Goal of Therapy:  Heparin level 0.3-0.7 units/ml Monitor platelets by anticoagulation protocol: Yes   Plan:   Continue Heparin infusion at 1200 units/hr  F/u daily heparin level and CBC   Thank you for allowing [place] to participate in this patients care. Christoper Fabian, PharmD, BCPS Clinical pharmacist, pager [phone] 12/10/2015,12:35 AM

## 2015-12-11 DIAGNOSIS — J438 Other emphysema: Secondary | ICD-10-CM

## 2015-12-11 DIAGNOSIS — Z95810 Presence of automatic (implantable) cardiac defibrillator: Secondary | ICD-10-CM

## 2015-12-11 DIAGNOSIS — R072 Precordial pain: Secondary | ICD-10-CM | POA: Diagnosis not present

## 2015-12-11 DIAGNOSIS — I25118 Atherosclerotic heart disease of native coronary artery with other forms of angina pectoris: Secondary | ICD-10-CM | POA: Diagnosis not present

## 2015-12-11 DIAGNOSIS — I200 Unstable angina: Secondary | ICD-10-CM

## 2015-12-11 LAB — CBC
HEMATOCRIT: 30.3 % — AB (ref 39.0–52.0)
Hemoglobin: 9.9 g/dL — ABNORMAL LOW (ref 13.0–17.0)
MCH: 31 pg (ref 26.0–34.0)
MCHC: 32.7 g/dL (ref 30.0–36.0)
MCV: 95 fL (ref 78.0–100.0)
Platelets: 209 10*3/uL (ref 150–400)
RBC: 3.19 MIL/uL — AB (ref 4.22–5.81)
RDW: 13.1 % (ref 11.5–15.5)
WBC: 4.4 10*3/uL (ref 4.0–10.5)

## 2015-12-11 LAB — BASIC METABOLIC PANEL
ANION GAP: 9 (ref 5–15)
BUN: 7 mg/dL (ref 6–20)
CHLORIDE: 105 mmol/L (ref 101–111)
CO2: 28 mmol/L (ref 22–32)
Calcium: 8.7 mg/dL — ABNORMAL LOW (ref 8.9–10.3)
Creatinine, Ser: 0.95 mg/dL (ref 0.61–1.24)
GFR calc non Af Amer: >60 mL/min (ref >60)
Glucose, Bld: 115 mg/dL — ABNORMAL HIGH (ref 65–99)
POTASSIUM: 4.1 mmol/L (ref 3.5–5.1)
SODIUM: 142 mmol/L (ref 135–145)

## 2015-12-11 MED ORDER — GABAPENTIN 300 MG PO CAPS: 300.0000 mg | ORAL_CAPSULE | Freq: Four times a day (QID) | ORAL | Status: DC

## 2015-12-11 MED ORDER — SENNOSIDES-DOCUSATE SODIUM 8.6-50 MG PO TABS: 2.0000 | ORAL_TABLET | Freq: Two times a day (BID) | ORAL | Status: AC
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
## 2015-12-11 NOTE — Progress Notes (Signed)
Patient currently hospitalized since 12/08/2015.  No ICM transmission received today and next scheduled transmission 12/19/2015.

## 2015-12-11 NOTE — Progress Notes (Signed)
Order received to discharge Pt.  Telemetry removed and CCMD notified.  IV removed with catheter intact.  Discharge education given to Pt with wife at bedside.  All questions answered.  Pt denies chest pain at time of discharge.  Pt stable to discharge.

## 2015-12-11 NOTE — Progress Notes (Signed)
Heart failure packet given to Pt at discharge.  Educated Pt on taking daily weights and what s/s Pt should note and when to call Dr.  Rock Nephew indicates understanding.

## 2015-12-11 NOTE — Care Management Note (Addendum)
Case Management Note Donn Pierini RN, BSN Unit 2W-Case Manager [phone]  Patient Details  Name: [name] MRN: [id number] Date of Birth: 1945-09-07  Subjective/Objective:    Pt admitted with chest pain                Action/Plan: PTA Pt lived at home with wife- s/p cath on 4/11- anticipate return home- pt independent with ADLs, has insurance and PCP- BUTLER, CYNTHIA,  no CM needs noted.   Expected Discharge Date:  12/11/15               Expected Discharge Plan:  Home/Self Care  In-House Referral:     Discharge planning Services  CM Consult  Post Acute Care Choice:    Choice offered to:     DME Arranged:    DME Agency:     HH Arranged:    HH Agency:     Status of Service:  Completed, signed off  Medicare Important Message Given:    Date Medicare IM Given:    Medicare IM give by:    Date Additional Medicare IM Given:    Additional Medicare Important Message give by:     If discussed at Long Length of Stay Meetings, dates discussed:      Additional Comments:  Darrold Span, RN 12/11/2015, 10:12 AM

## 2015-12-11 NOTE — Progress Notes (Signed)
    SUBJECTIVE:  No chest pain.  No SOB.  He ambulated in the room without difficulty   PHYSICAL EXAM Filed Vitals:   12/10/15 1530 12/10/15 1600 12/10/15 2147 12/11/15 0420  BP: 89/60 121/64 149/80 99/54  Pulse: 64 65 71 73  Temp:   97.9 F (36.6 C) 98 F (36.7 C)  TempSrc:   Oral Oral  Resp: 18 18 18 18   Height:      Weight:      SpO2: 99% 100% 98% 96%   General:  No distress Lungs:  Clear Heart:  RRR Abdomen:  Positive bowel sounds, no rebound no guarding Extremities:  Right wrist without bruising or bleeding.   LABS: Lab Results  Component Value Date   TROPONINI 0.03 12/09/2015   Results for orders placed or performed during the hospital encounter of 12/08/15 (from the past 24 hour(s))  POCT Activated clotting time     Status: None   Collection Time: 12/10/15 11:15 AM  Result Value Ref Range   Activated Clotting Time 131 seconds  Basic metabolic panel     Status: Abnormal   Collection Time: 12/11/15  5:31 AM  Result Value Ref Range   Sodium 142 135 - 145 mmol/L   Potassium 4.1 3.5 - 5.1 mmol/L   Chloride 105 101 - 111 mmol/L   CO2 28 22 - 32 mmol/L   Glucose, Bld 115 (H) 65 - 99 mg/dL   BUN 7 6 - 20 mg/dL   Creatinine, Ser 5.37 0.61 - 1.24 mg/dL   Calcium 8.7 (L) 8.9 - 10.3 mg/dL   GFR calc non Af Amer >60 >60 mL/min   GFR calc Af Amer >60 >60 mL/min   Anion gap 9 5 - 15  CBC     Status: Abnormal   Collection Time: 12/11/15  5:31 AM  Result Value Ref Range   WBC 4.4 4.0 - 10.5 K/uL   RBC 3.19 (L) 4.22 - 5.81 MIL/uL   Hemoglobin 9.9 (L) 13.0 - 17.0 g/dL   HCT 48.2 (L) 70.7 - 86.7 %   MCV 95.0 78.0 - 100.0 fL   MCH 31.0 26.0 - 34.0 pg   MCHC 32.7 30.0 - 36.0 g/dL   RDW 54.4 92.0 - 10.0 %   Platelets 209 150 - 400 K/uL    Intake/Output Summary (Last 24 hours) at 12/11/15 0741 Last data filed at 12/11/15 0000  Gross per 24 hour  Intake 1590.67 ml  Output    700 ml  Net 890.67 ml    CATH:      Mid RCA lesion, 100% stenosed.  Ost LM lesion, 20%  stenosed.  Prox LAD to Mid LAD lesion, 20% stenosed.  Mid LAD lesion, 20% stenosed.   ASSESSMENT AND PLAN:  CHEST PAIN:  Occluded RCA.  Medical management.    SYNCOPE:  Etiology is unclear.   Need to check on the device interrogation that was apparently done at [facility].    CARDIOMYOPATHY:  EF 40%.   Euvlolemic continue current meds.    Rollene Rotunda 12/11/2015 7:41 AM

## 2015-12-12 ENCOUNTER — Ambulatory Visit (INDEPENDENT_AMBULATORY_CARE_PROVIDER_SITE_OTHER): Payer: Medicare HMO

## 2015-12-12 DIAGNOSIS — Z95810 Presence of automatic (implantable) cardiac defibrillator: Secondary | ICD-10-CM

## 2015-12-12 DIAGNOSIS — I5022 Chronic systolic (congestive) heart failure: Secondary | ICD-10-CM

## 2015-12-12 NOTE — Progress Notes (Signed)
Call to patient.  Provided Dr McDowell's orders to increase Lasix 80 mg to twice daily x 2 days and then return to once a day.  Requested he call Dr McDowell's office today to make appointment for follow up.  Repeat transmission 12/19/2015.

## 2015-12-12 NOTE — Progress Notes (Signed)
Jonelle Sidle, MD   Sent: Thu December 12, 2015 11:53 AM    To: Karie Soda, RN    Cc: Eustace Moore, LPN        Message     Reviewed. Increase Lasix to 80 mg twice daily for the next 2 days then back to once daily. He should have an office follow-up visit already scheduled in light of recent hospital discharge.   Attempted call to patient and wife stated he was not at home.  He should be home within the hour.  I stated I would call back.

## 2015-12-12 NOTE — Progress Notes (Signed)
EPIC Encounter for ICM Monitoring  Patient Name: [name] is a 70 y.o. male Date: 12/12/2015 Primary Care Physican: Samuel Jester, DO Primary Cardiologist: Diona Browner Electrophysiologist: Allred Dry Weight: 192 lbs ( prior to hospitalization 188-189 lbs)   In the past month, have you:  1. Gained more than 2 pounds in a day or more than 5 pounds in a week? Yes, 3-4 pounds during hospitalization.  Discharged 12/11/2015  2. Had changes in your medications (with verification of current medications)? Yes, [place] added at time of hospital discharge  3. Had more shortness of breath than is usual for you? Yes, increased with activities  4. Limited your activity because of shortness of breath? no  5. Not been able to sleep because of shortness of breath? no  6. Had increased swelling in your feet or ankles? no  7. Had symptoms of dehydration (dizziness, dry mouth, increased thirst, decreased urine output) no  8. Had changes in sodium restriction? no  9. Been compliant with medication? Yes   ICM trend: 3 month view for 12/12/2015   ICM trend: 1 year view for 12/12/2015   Follow-up plan: ICM clinic phone appointment on 12/19/2015.  Thoracic impedance below reference line from 12/09/2015 to 12/11/2015 suggesting fluid accumulation.  Patient has symptoms of weight gain and SOB with activities.    Advised to make post hospital appointments with PCP and Dr Diona Browner.  Patient reported he was not given Lasix during his hospitalization but will resume, Furosemide 80 mg daily, today.    12/11/2015 Creatinine 0.95, BUN 7,   Potassium 4.1 12/10/2015 Creatinine 1.19, BUN 10, Potassium 3.9 12/09/2015 Creatinine 1.15, BUN 11, Potassium 4.1 12/08/2015 Creatinine 1.28, BUN 18, Potassium 3.1 06/24/2015 Creatinine 1.30, BUN 17, Potassium 4.8 (received from Dr Shela Commons office) 04/04/2015 Creatinine 1.38, BUN 14, Potassium 4.2 (received from Dr Shela Commons office) 11/10/2012 Creatinine 1.33 BUN 15,   Potassium 3.4        Advised will send to Dr Diona Browner and Dr Johney Frame for review and recommendations for symptoms of weight gain and increase in SOB related to hospitalization and did not received Lasix during        hospitalization per patient.     Copy of note sent to patient's primary care physician, primary cardiologist, and device following physician.  Karie Soda, RN, CCM 12/12/2015 10:33 AM

## 2015-12-19 ENCOUNTER — Telehealth: Payer: Self-pay | Admitting: Cardiology

## 2015-12-19 ENCOUNTER — Ambulatory Visit (INDEPENDENT_AMBULATORY_CARE_PROVIDER_SITE_OTHER): Payer: Medicare HMO | Admitting: *Deleted

## 2015-12-19 DIAGNOSIS — Z95810 Presence of automatic (implantable) cardiac defibrillator: Secondary | ICD-10-CM

## 2015-12-19 DIAGNOSIS — I429 Cardiomyopathy, unspecified: Secondary | ICD-10-CM

## 2015-12-19 NOTE — Telephone Encounter (Signed)
LMOVM reminding pt to send remote transmission.   

## 2015-12-19 NOTE — Progress Notes (Signed)
EPIC Encounter for ICM Monitoring  Patient Name: [name] is a 70 y.o. male Date: 12/19/2015 Primary Care Physican: Samuel Jester, DO Primary Cardiologist: Diona Browner Electrophysiologist: Allred Dry Weight: unknown    In the past month, have you:  1. Gained more than 2 pounds in a day or more than 5 pounds in a week? N/A  2. Had changes in your medications (with verification of current medications)? N/A  3. Had more shortness of breath than is usual for you? N/A  4. Limited your activity because of shortness of breath? N/A  5. Not been able to sleep because of shortness of breath? N/A  6. Had increased swelling in your feet or ankles? N/A  7. Had symptoms of dehydration (dizziness, dry mouth, increased thirst, decreased urine output) N/A  8. Had changes in sodium restriction? N/A  9. Been compliant with medication? N/A   ICM trend: 3 month view for 12/19/2015   ICM trend: 1 year view for 12/19/2015   Follow-up plan: ICM clinic phone appointment on  01/13/2016 and appointment with Dr Diona Browner on 01/14/2016.  Attempted call to patient and unable to reach.  Transmission reviewed.  Since last ICM transmission on 12/12/2015, thoracic impedance above reference line correlating with increase in Furosemide dosage as prescribed x 2 days (12/12/2015 to 12/16/2015) suggesting no further fluid accumulation.      Unable to contact patient for follow up.  Will repeat transmission one day prior to appointment with Dr Diona Browner.    Copy of note sent to patient's primary care physician, primary cardiologist, and device following physician.  Karie Soda, RN, CCM 12/19/2015 2:40 PM

## 2015-12-20 NOTE — Progress Notes (Signed)
Remote ICD transmission.   

## 2015-12-24 ENCOUNTER — Ambulatory Visit (INDEPENDENT_AMBULATORY_CARE_PROVIDER_SITE_OTHER): Payer: Medicare HMO | Admitting: Cardiology

## 2015-12-24 ENCOUNTER — Encounter: Payer: Self-pay | Admitting: Cardiology

## 2015-12-24 VITALS — BP 123/74 | HR 70 | Ht 69.0 in | Wt 190.2 lb

## 2015-12-24 DIAGNOSIS — Z95810 Presence of automatic (implantable) cardiac defibrillator: Secondary | ICD-10-CM

## 2015-12-24 DIAGNOSIS — I429 Cardiomyopathy, unspecified: Secondary | ICD-10-CM

## 2015-12-24 DIAGNOSIS — I2510 Atherosclerotic heart disease of native coronary artery without angina pectoris: Secondary | ICD-10-CM

## 2015-12-24 DIAGNOSIS — I10 Essential (primary) hypertension: Secondary | ICD-10-CM | POA: Diagnosis not present

## 2015-12-24 NOTE — Progress Notes (Signed)
Cardiology Office Note  Date: 12/24/2015   ID: [name], Bryan 05/23/1946, MRN [id number]  PCP: Arin Peral Jester, DO  Primary Cardiologist: Nona Dell, MD   Chief Complaint  Patient presents with  . Hospitalization Follow-up    History of Present Illness: [name] is a 70 y.o. male last seen in September 2016. Interval history reviewed. He was recently admitted to [facility] with chest discomfort, ruled out for ACS, underwent follow-up cardiac catheterization which revealed mild CAD within the left coronary system, occluded mid RCA associated with collaterals and recommendation for medical therapy.  He presents for a follow-up visit today, reports no angina symptoms. We reviewed his medications. Cardiac regimen includes aspirin, Toprol-XL, Lasix, and Crestor.  He states that he has had some recent foot problems due to a "collapsed arch" and also a problem with a "plate."  May be in need of a procedure/surgery, but details are not clear.  He continues to follow with Dr. Johney Frame for management of St. Jude ICD. Denies any palpitations or device shocks.  Past Medical History  Diagnosis Date  . Essential hypertension, benign   . Mixed hyperlipidemia   . Cardiomyopathy     Mixed, LVEF 35% up to 50%  . H/O alcohol abuse   . Depression   . Obesity   . COPD (chronic obstructive pulmonary disease) (HCC)   . Osteoarthritis   . DDD (degenerative disc disease)   . GERD (gastroesophageal reflux disease)   . ACTH deficiency (HCC)     Dr. Lucianne Muss  . ICD (implantable cardiac defibrillator) in place   . Sleep apnea     Noncompliance with CPAP therapy  . Full dentures   . Wears glasses   . Coronary atherosclerosis of native coronary artery     Occluded RCA with good collaterals  . High cholesterol     Past Surgical History  Procedure Laterality Date  . Umbilical hernia repair    . Knee surgery    . Ankle surgery  right  . Femur surgery  right  . Nasal sinus surgery      . Icd implantation  06/18/2009    St Jude Fortify DR implanted by Dr. Johney Frame  . Transurethral resection of prostate  2013  . Back surgery      Multiple back surgeries for chronic back pain and lumbar fusion  . Foot arthrodesis Right 11/10/2012    Procedure: Right Hallux Metatarsal-phalangeal Joint Arthrodesis; Second Metatarsal Head Resection and Second, Third and Fourth Metatarsal Weil Osteotomy;  Surgeon: Toni Arthurs, MD;  Location: [facility];  Service: Orthopedics;  Laterality: Right;  . Hammer toe surgery Right 11/10/2012    Procedure: Right Second, Third, and Fourth Hammertoe Corrections;  Surgeon: Toni Arthurs, MD;  Location: [facility];  Service: Orthopedics;  Laterality: Right;  . Cardiac catheterization N/A 12/10/2015    Procedure: Left Heart Cath and Coronary Angiography;  Surgeon: Lennette Bihari, MD;  Location: [facility] INVASIVE CV LAB;  Service: Cardiovascular;  Laterality: N/A;    Current Outpatient Prescriptions  Medication Sig Dispense Refill  . amitriptyline (ELAVIL) 25 MG tablet Take 25 mg by mouth at bedtime as needed for sleep.    Marland Kitchen aspirin EC 81 MG tablet Take 81 mg by mouth daily.    . busPIRone (BUSPAR) 10 MG tablet Take 10 mg by mouth 3 (three) times daily.    . Cholecalciferol (VITAMIN D3) 5000 UNITS CAPS Take 1 capsule by mouth daily.    Marland Kitchen  fenofibrate 160 MG tablet Take 160 mg by mouth daily.      . furosemide (LASIX) 80 MG tablet Take 80 mg by mouth daily.    Marland Kitchen gabapentin (NEURONTIN) 300 MG capsule Take 1-2 capsules (300-600 mg total) by mouth 4 (four) times daily. 1 tablet three times a day and 2 at bedtime.    . metoprolol succinate (TOPROL-XL) 25 MG 24 hr tablet TAKE 1 TABLET BY MOUTH EVERY DAY 30 tablet 9  . omeprazole (PRILOSEC) 20 MG capsule Take 20 mg by mouth 2 (two) times daily.    Marland Kitchen oxyCODONE (ROXICODONE) 15 MG immediate release tablet Take 15 mg by mouth 4 (four) times daily.     . OXYCONTIN 60 MG T12A Take 1 tablet by mouth 2 (two)  times daily.     Marland Kitchen PROAIR HFA 108 (90 Base) MCG/ACT inhaler Inhale 1-2 puffs into the lungs every 6 (six) hours as needed for wheezing or shortness of breath.     . rosuvastatin (CRESTOR) 10 MG tablet Take 20 mg by mouth every evening.     . senna-docusate (SENOKOT-S) 8.6-50 MG tablet Take 2 tablets by mouth 2 (two) times daily. 20 tablet 1  . tamsulosin (FLOMAX) 0.4 MG CAPS Take 0.4 mg by mouth daily after supper.    . temazepam (RESTORIL) 30 MG capsule Take 30 mg by mouth at bedtime as needed for sleep.    Marland Kitchen venlafaxine XR (EFFEXOR-XR) 150 MG 24 hr capsule Take 150 mg by mouth daily.     No current facility-administered medications for this visit.   Allergies:  Penicillins   Social History: The patient  reports that he quit smoking about 18 years ago. His smoking use included Cigarettes. He has a 36 pack-year smoking history. He has never used smokeless tobacco. He reports that he does not drink alcohol or use illicit drugs.   ROS:  Please see the history of present illness. Otherwise, complete review of systems is positive for chronic pain.  All other systems are reviewed and negative.   Physical Exam: VS:  BP 123/74 mmHg  Pulse 70  Ht 5\' 9"  (1.753 m)  Wt 190 lb 3.2 oz (86.274 kg)  BMI 28.07 kg/m2  SpO2 99%, BMI Body mass index is 28.07 kg/(m^2).  Wt Readings from Last 3 Encounters:  12/24/15 190 lb 3.2 oz (86.274 kg)  12/09/15 186 lb 8.2 oz (84.6 kg)  05/21/15 186 lb (84.369 kg)    Overweight male, comfortable at rest.  HEENT: Conjunctiva and lids normal, oropharynx clear.  Neck: Supple, no elevated JVP, no thyromegaly.  Lungs: Decreased but clear to auscultation, nonlabored breathing at rest.  Cardiac: Regular rate and rhythm with frequent ectopy, no S3, no pericardial rub.  Abdomen: Obese, nontender, bowel sounds present, no guarding or rebound.  Extremities: Trace to 1+ edema, distal pulses 1+.   ECG: I personally reviewed the prior tracing from 12/09/2015 which  showed sinus rhythm, small high lateral Q waves.  Recent Labwork: 12/09/2015: ALT 16*; AST 33 12/10/2015: Magnesium 1.9 12/11/2015: BUN 7; Creatinine, Ser 0.95; Hemoglobin 9.9*; Platelets 209; Potassium 4.1; Sodium 142   Other Studies Reviewed Today:  Cardiac catheterization 12/10/2015:  Mid RCA lesion, 100% stenosed.  Ost LM lesion, 20% stenosed.  Prox LAD to Mid LAD lesion, 20% stenosed.  Mid LAD lesion, 20% stenosed.  Moderate LV dysfunction with focal significant hypokinesis in the mid inferior wall and mild distal anterolateral hypocontractility with an overall ejection fraction at 40%.  Significant coronary calcification with ostial  smooth 20% narrowing of the left main; significant calcification of the LAD, proximal legs and into the mid segment with segmental 20% areas of luminal irregularity; normal ramus intermediate vessel; no significant stenoses in the left circumflex coronary artery; and total proximal occlusion of a moderate size RCA with extensive bridging collaterals supplying antegrade flow to the PDA and faint retrograde collaterals to the distal RCA via the left circulation.  RECOMMENDATION: Medical therapy.  Echocardiogram 11/02/2014: Study Conclusions  - Left ventricle: The cavity size was normal. Wall thickness was normal. Systolic function was normal. The estimated ejection fraction was in the range of 55% to 60%. Wall motion was normal; there were no regional wall motion abnormalities. Left ventricular diastolic function parameters were normal. - Aortic valve: Valve area (VTI): 3.78 cm^2. Valve area (Vmax): 3.44 cm^2. Valve area (Vmean): 3.59 cm^2. - Mitral valve: There was mild regurgitation directed posteriorly. - Left atrium: The atrium was moderately dilated. - Atrial septum: No defect or patent foramen ovale was identified. - Technically adequate study.  Assessment and Plan:  1. CAD with known occlusion of the RCA associated with  collaterals, overall stable coronary anatomy by recent cardiac catheterization as detailed above. Plan to continue medical therapy and observation.  2. Ischemic cardiomyopathy, LVEF normal by echocardiogram from last year, however in the range of 40% at recent angiography. He is symptomatically stable without heart failure symptoms.  3. St. Jude ICD in place, followed by Dr. Johney Frame.  4. Essential hypertension, blood pressure is normal today.  Current medicines were reviewed with the patient today.  Disposition: FU with me in 6 months.   Signed, Jonelle Sidle, MD, [facility] 12/24/2015 4:44 PM    [facility] [address] [place], [place], [place] [address] Phone: [phone]; Fax: [phone]

## 2015-12-24 NOTE — Patient Instructions (Signed)
Your physician wants you to follow-up in: 6 months with Dr. McDowell You will receive a reminder letter in the mail two months in advance. If you don't receive a letter, please call our office to schedule the follow-up appointment.  Your physician recommends that you continue on your current medications as directed. Please refer to the Current Medication list given to you today.  Thank you for choosing [facility]!!    

## 2016-01-13 ENCOUNTER — Ambulatory Visit (INDEPENDENT_AMBULATORY_CARE_PROVIDER_SITE_OTHER): Payer: Medicare HMO

## 2016-01-13 ENCOUNTER — Telehealth: Payer: Self-pay

## 2016-01-13 DIAGNOSIS — I5022 Chronic systolic (congestive) heart failure: Secondary | ICD-10-CM

## 2016-01-13 DIAGNOSIS — Z95810 Presence of automatic (implantable) cardiac defibrillator: Secondary | ICD-10-CM | POA: Diagnosis not present

## 2016-01-13 NOTE — Telephone Encounter (Signed)
Remote ICM transmission received.  Attempted patient call and left message for return call.   

## 2016-01-13 NOTE — Progress Notes (Signed)
EPIC Encounter for ICM Monitoring  Patient Name: [name] is a 70 y.o. male Date: 01/13/2016 Primary Care Physican: Samuel Jester, DO Primary Cardiologist: Diona Browner Electrophysiologist: Allred Dry Weight: unknown       In the past month, have you:  1. Gained more than 2 pounds in a day or more than 5 pounds in a week? N/A  2. Had changes in your medications (with verification of current medications)? N/A  3. Had more shortness of breath than is usual for you? N/A  4. Limited your activity because of shortness of breath? N/A  5. Not been able to sleep because of shortness of breath? N/A  6. Had increased swelling in your feet, ankles, legs or stomach area? N/A  7. Had symptoms of dehydration (dizziness, dry mouth, increased thirst, decreased urine output) N/A  8. Had changes in sodium restriction? N/A  9. Been compliant with medication? N/A  ICM trend: 3 month view for 01/13/2016   ICM trend: 1 year view for 01/13/2016   Follow-up plan: ICM clinic phone appointment 02/13/2016.  Attempted call to patient and unable to reach.  Transmission reviewed.   FLUID LEVELS:  Corvue thoracic impedance decreased trending along baseline suggesting stable fluid levels.     Karie Soda, RN, CCM 01/13/2016 1:55 PM

## 2016-01-14 ENCOUNTER — Encounter: Payer: Medicare HMO | Admitting: Cardiology

## 2016-01-24 ENCOUNTER — Encounter: Payer: Self-pay | Admitting: Cardiology

## 2016-01-24 LAB — CUP PACEART REMOTE DEVICE CHECK
Date Time Interrogation Session: 20170526153542
HighPow Impedance: 53 Ohm
Implantable Lead Implant Date: 20101019
Implantable Lead Location: 753859
Lead Channel Sensing Intrinsic Amplitude: 11.7 mV
Lead Channel Setting Pacing Amplitude: 2 V
Lead Channel Setting Pacing Amplitude: 3 V
MDC IDC LEAD IMPLANT DT: 20101019
MDC IDC LEAD LOCATION: 753860
MDC IDC LEAD MODEL: 7121
MDC IDC MSMT LEADCHNL RA IMPEDANCE VALUE: 260 Ohm
MDC IDC MSMT LEADCHNL RA SENSING INTR AMPL: 3.7 mV
MDC IDC MSMT LEADCHNL RV IMPEDANCE VALUE: 410 Ohm
MDC IDC SET LEADCHNL RV PACING PULSEWIDTH: 0.8 ms
MDC IDC SET LEADCHNL RV SENSING SENSITIVITY: 0.5 mV
Pulse Gen Serial Number: 593609

## 2016-02-13 ENCOUNTER — Ambulatory Visit (INDEPENDENT_AMBULATORY_CARE_PROVIDER_SITE_OTHER): Payer: Medicare HMO

## 2016-02-13 ENCOUNTER — Telehealth: Payer: Self-pay

## 2016-02-13 DIAGNOSIS — Z95810 Presence of automatic (implantable) cardiac defibrillator: Secondary | ICD-10-CM | POA: Diagnosis not present

## 2016-02-13 DIAGNOSIS — I5022 Chronic systolic (congestive) heart failure: Secondary | ICD-10-CM | POA: Diagnosis not present

## 2016-02-13 NOTE — Telephone Encounter (Signed)
Attempted call to patient on home and cell number to request to send ICM remote transmission. Left message on cell phone.

## 2016-02-14 NOTE — Progress Notes (Signed)
EPIC Encounter for ICM Monitoring  Patient Name: [name] is a 70 y.o. male Date: 02/14/2016 Primary Care Physican: Samuel Jester, DO Primary Cardiologist: Diona Browner Electrophysiologist: Allred Dry Weight: 190 lbs       In the past month, have you:  1. Gained more than 2 pounds in a day or more than 5 pounds in a week? No  2. Had changes in your medications (with verification of current medications)? No  3. Had more shortness of breath than is usual for you? No   4. Limited your activity because of shortness of breath? No   5. Not been able to sleep because of shortness of breath? No   6. Had increased swelling in your feet, ankles, legs or stomach area? No   7. Had symptoms of dehydration (dizziness, dry mouth, increased thirst, decreased urine output) No   8. Had changes in sodium restriction? No   9. Been compliant with medication? Yes   ICM trend: 3 month view for 02/13/2016   ICM trend: 1 year view for 02/13/2016   Follow-up plan: ICM clinic phone appointment 04/14/2016 and office appointment for defib check with Dr Johney Frame 03/13/2016.    FLUID LEVELS: Corvue thoracic impedance decreased 02/13/2016 suggesting start of fluid accumulation.    SYMPTOMS:  He denied any symptoms such as weight gain of 3 pounds overnight or 5 pounds within a week, SOB and/or lower extremity swelling.  He reported he has been on vacation and returned on 02/13/2016.  He stated he took his Lasix late so that may be why fluid is showing only yesterday,.  Encouraged to call if any fluid symptoms develop.    EDUCATION:  Encouraged to limit sodium intake to < 2000 mg and fluid intake.  LABS: 12/11/2015 Creatinine 0.95, BUN 7, Potassium 4.1, Sodium 142 12/10/2015 Creatinine 1.19, BUN 10, Potassium 3.9, Sodium 141 4/10//2017 Creatinine 1.15, BUN 11, Potassium 3.4, Sodium 141 12/08/2015 Creatinine 1.28, BUN 18, Potassium 3.1, Sodium 139  RECOMMENDATIONS: No changes today.     Karie Soda, RN,  CCM 02/14/2016 9:24 AM

## 2016-02-21 ENCOUNTER — Emergency Department (HOSPITAL_COMMUNITY)
Admission: EM | Admit: 2016-02-21 | Discharge: 2016-02-22 | Disposition: A | Discharge status: Home / Self Care | Payer: Medicare HMO | Attending: Emergency Medicine | Admitting: Emergency Medicine

## 2016-02-21 ENCOUNTER — Encounter (HOSPITAL_COMMUNITY): Payer: Self-pay

## 2016-02-21 ENCOUNTER — Other Ambulatory Visit: Payer: Self-pay

## 2016-02-21 ENCOUNTER — Emergency Department (HOSPITAL_COMMUNITY): Payer: Medicare HMO

## 2016-02-21 DIAGNOSIS — R0789 Other chest pain: Secondary | ICD-10-CM | POA: Insufficient documentation

## 2016-02-21 DIAGNOSIS — R0602 Shortness of breath: Secondary | ICD-10-CM | POA: Diagnosis not present

## 2016-02-21 DIAGNOSIS — I10 Essential (primary) hypertension: Secondary | ICD-10-CM | POA: Insufficient documentation

## 2016-02-21 DIAGNOSIS — Z7982 Long term (current) use of aspirin: Secondary | ICD-10-CM | POA: Diagnosis not present

## 2016-02-21 DIAGNOSIS — M1990 Unspecified osteoarthritis, unspecified site: Secondary | ICD-10-CM | POA: Diagnosis not present

## 2016-02-21 DIAGNOSIS — F329 Major depressive disorder, single episode, unspecified: Secondary | ICD-10-CM | POA: Insufficient documentation

## 2016-02-21 DIAGNOSIS — Z87891 Personal history of nicotine dependence: Secondary | ICD-10-CM | POA: Insufficient documentation

## 2016-02-21 DIAGNOSIS — E782 Mixed hyperlipidemia: Secondary | ICD-10-CM | POA: Diagnosis not present

## 2016-02-21 DIAGNOSIS — I2510 Atherosclerotic heart disease of native coronary artery without angina pectoris: Secondary | ICD-10-CM | POA: Insufficient documentation

## 2016-02-21 DIAGNOSIS — J449 Chronic obstructive pulmonary disease, unspecified: Secondary | ICD-10-CM | POA: Diagnosis not present

## 2016-02-21 DIAGNOSIS — Z79899 Other long term (current) drug therapy: Secondary | ICD-10-CM | POA: Insufficient documentation

## 2016-02-21 DIAGNOSIS — R079 Chest pain, unspecified: Secondary | ICD-10-CM

## 2016-02-21 LAB — CBC
HEMATOCRIT: 37.9 % — AB (ref 39.0–52.0)
HEMOGLOBIN: 12.6 g/dL — AB (ref 13.0–17.0)
MCH: 30.6 pg (ref 26.0–34.0)
MCHC: 33.2 g/dL (ref 30.0–36.0)
MCV: 92 fL (ref 78.0–100.0)
Platelets: 287 10*3/uL (ref 150–400)
RBC: 4.12 MIL/uL — ABNORMAL LOW (ref 4.22–5.81)
RDW: 13.2 % (ref 11.5–15.5)
WBC: 5.1 10*3/uL (ref 4.0–10.5)

## 2016-02-21 LAB — BASIC METABOLIC PANEL
ANION GAP: 7 (ref 5–15)
BUN: 18 mg/dL (ref 6–20)
CO2: 31 mmol/L (ref 22–32)
Calcium: 9.1 mg/dL (ref 8.9–10.3)
Chloride: 96 mmol/L — ABNORMAL LOW (ref 101–111)
Creatinine, Ser: 1.38 mg/dL — ABNORMAL HIGH (ref 0.61–1.24)
GFR calc Af Amer: 59 mL/min — ABNORMAL LOW (ref >60)
GFR, EST NON AFRICAN AMERICAN: 51 mL/min — AB (ref >60)
Glucose, Bld: 111 mg/dL — ABNORMAL HIGH (ref 65–99)
POTASSIUM: 3.7 mmol/L (ref 3.5–5.1)
SODIUM: 134 mmol/L — AB (ref 135–145)

## 2016-02-21 LAB — TROPONIN I: Troponin I: 0.03 ng/mL (ref <0.031)

## 2016-02-21 NOTE — ED Provider Notes (Signed)
CSN: [id number]     Arrival date & time 02/21/16  2240 History  By signing my name below, I, [name], attest that this documentation has been prepared under the direction and in the presence of [name], MD at 23:52 AM. Electronically Signed: Ronney [name], ED Scribe. 02/21/2016. 12:17 AM.   Chief Complaint  Patient presents with  . Chest Pain   The history is provided by the patient. No language interpreter was used.    HPI Comments: [name] is a 70 y.o. 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 He reports he felt associated shortness of breath, diaphoresis, and nausea when his symptoms began. He rates the pain presently at 3-4/10 but at 9/10 at its highest. He states he was outside in hot, humid weather doing yard work for 15 minutes about 2 hours before his pain onset. Patient states he had taken a BC powder and 2 aspirin, with some relief.  Patient states he had the same chest pain several months ago and was found to have blockages at [facility]; he states he did not have any coronary stents placed at that time. Patient states he is a former smoker. He lives with his wife. He states he ambulates without assistance. Patient states he takes a daily baby aspirin but otherwise denies being on any chronic anticoagulation.   Cardiologist: Dr. Diona Browner PCP: Samuel Jester, DO   Past Medical History  Diagnosis Date  . Essential hypertension, benign   . Mixed hyperlipidemia   . Cardiomyopathy     Mixed, LVEF 35% up to  50%  . H/O alcohol abuse   . Depression   . Obesity   . COPD (chronic obstructive pulmonary disease) (HCC)   . Osteoarthritis   . DDD (degenerative disc disease)   . GERD (gastroesophageal reflux disease)   . ACTH deficiency (HCC)     Dr. Lucianne Muss  . ICD (implantable cardiac defibrillator) in place   . Sleep apnea     Noncompliance with CPAP therapy  . Full dentures   . Wears glasses   . Coronary atherosclerosis of native coronary artery     Occluded RCA with good collaterals  . High cholesterol    Past Surgical History  Procedure Laterality Date  . Umbilical hernia repair    . Knee surgery    . Ankle surgery  right  . Femur surgery  right  . Nasal sinus surgery    . Icd implantation  06/18/2009    St Jude Fortify DR implanted by Dr. Johney Frame  . Transurethral resection of prostate  2013  . Back surgery      Multiple back surgeries for chronic back pain and lumbar fusion  . Foot arthrodesis Right 11/10/2012    Procedure: Right Hallux Metatarsal-phalangeal Joint Arthrodesis; Second Metatarsal Head Resection and Second, Third and Fourth Metatarsal Weil Osteotomy;  Surgeon: Toni Arthurs, MD;  Location: [facility];  Service: Orthopedics;  Laterality: Right;  . Hammer toe surgery Right 11/10/2012    Procedure: Right Second, Third, and Fourth Hammertoe Corrections;  Surgeon: Toni Arthurs, MD;  Location:  [facility];  Service: Orthopedics;  Laterality: Right;  . Cardiac catheterization N/A 12/10/2015    Procedure: Left Heart Cath and Coronary Angiography;  Surgeon: Lennette Bihari, MD;  Location: [facility] INVASIVE CV LAB;  Service: Cardiovascular;  Laterality: N/A;   Family History  Problem Relation Age of Onset  . Coronary artery disease    . Arthritis     Social History  Substance Use Topics  . Smoking status: Former Smoker -- 1.00 packs/day for 36 years    Types: Cigarettes    Quit date: 05/26/1997  . Smokeless tobacco: Never Used  . Alcohol Use: No      Comment: History of abuse. Still admits to occasional binges (denies 12/08/15)  lives at home Lives with spouse  Review of Systems  Constitutional: Positive for diaphoresis.  Respiratory: Positive for shortness of breath.   Cardiovascular: Positive for chest pain.  Gastrointestinal: Positive for nausea.  Hematological: Does not bruise/bleed easily.  All other systems reviewed and are negative.     Allergies  Penicillins  Home Medications   Prior to Admission medications   Medication Sig Start Date End Date Taking? Authorizing Provider  amitriptyline (ELAVIL) 25 MG tablet Take 25 mg by mouth at bedtime as needed for sleep.   Yes Historical Provider, MD  aspirin EC 81 MG tablet Take 81 mg by mouth daily.   Yes Historical Provider, MD  Aspirin-Salicylamide-Caffeine (BC HEADACHE) 325-95-16 MG TABS Take 1 packet by mouth daily as needed (for pain).   Yes Historical Provider, MD  Cholecalciferol (VITAMIN D3) 5000 UNITS CAPS Take 1 capsule by mouth daily.   Yes Historical Provider, MD  fenofibrate 160 MG tablet Take 160 mg by mouth daily.     Yes Historical Provider, MD  furosemide (LASIX) 80 MG tablet Take 80 mg by mouth daily.   Yes Historical Provider, MD  gabapentin (NEURONTIN) 600 MG tablet Take 600-1,200 mg by mouth 4 (four) times daily. Take one tablet three times daily and may take two tablets at bedtime   Yes Historical Provider, MD  metoprolol succinate (TOPROL-XL) 25 MG 24 hr tablet TAKE 1 TABLET BY MOUTH EVERY DAY 07/05/15  Yes Jonelle Sidle, MD  omeprazole (PRILOSEC) 20 MG capsule Take 20 mg by mouth 2 (two) times daily. 12/05/15  Yes Historical Provider, MD  oxyCODONE (ROXICODONE) 15 MG immediate release tablet Take 15 mg by mouth 4 (four) times daily.  05/17/15  Yes Historical Provider, MD  OXYCONTIN 60 MG T12A Take 1 tablet by mouth 2 (two) times daily.  05/17/15  Yes Historical Provider, MD  PROAIR HFA 108 (90 Base) MCG/ACT inhaler Inhale 1-2 puffs into the lungs every 6 (six)  hours as needed for wheezing or shortness of breath.  10/07/15  Yes Historical Provider, MD  rosuvastatin (CRESTOR) 10 MG tablet Take 20 mg by mouth every evening.    Yes Historical Provider, MD  senna-docusate (SENOKOT-S) 8.6-50 MG tablet Take 2 tablets by mouth 2 (two) times daily. Patient taking differently: Take 2 tablets by mouth at bedtime.  12/11/15  Yes Zannie Cove, MD  tamsulosin (FLOMAX) 0.4 MG CAPS Take 0.4 mg by mouth daily after supper.   Yes Historical Provider, MD  temazepam (RESTORIL) 30 MG capsule Take 30 mg by mouth at bedtime as needed for sleep.   Yes Historical Provider, MD  venlafaxine XR (EFFEXOR-XR) 150 MG 24 hr capsule Take 150 mg by mouth daily. 12/05/15  Yes Historical Provider, MD  gabapentin (NEURONTIN) 300 MG capsule Take  1-2 capsules (300-600 mg total) by mouth 4 (four) times daily. 1 tablet three times a day and 2 at bedtime. Patient not taking: Reported on 02/21/2016 12/11/15   Zannie Cove, MD  nitroGLYCERIN (NITROSTAT) 0.4 MG SL tablet Place 1 tablet (0.4 mg total) under the tongue every 5 (five) minutes as needed for chest pain. 02/22/16   [name], MD   BP 128/80 mmHg  Pulse 82  Temp(Src) 97.8 F (36.6 C) (Oral)  Resp 9  Ht 5\' 10"  (1.778 m)  Wt 190 lb (86.183 kg)  BMI 27.26 kg/m2  SpO2 94%  Vital signs normal   Physical Exam  Constitutional: He is oriented to person, place, and time. He appears well-developed and well-nourished.  Non-toxic appearance. He does not appear ill. No distress.  HENT:  Head: Normocephalic and atraumatic.  Right Ear: External ear normal.  Left Ear: External ear normal.  Nose: Nose normal. No mucosal edema or rhinorrhea.  Mouth/Throat: Oropharynx is clear and moist and mucous membranes are normal. No dental abscesses or uvula swelling.  Eyes: Conjunctivae and EOM are normal. Pupils are equal, round, and reactive to light.  Neck: Normal range of motion and full passive range of motion without pain. Neck supple.   Cardiovascular: Normal rate, regular rhythm and normal heart sounds.  Exam reveals no gallop and no friction rub.   No murmur heard. Pulmonary/Chest: Effort normal and breath sounds normal. No respiratory distress. He has no wheezes. He has no rhonchi. He has no rales. He exhibits no tenderness and no crepitus.  Abdominal: Soft. Normal appearance and bowel sounds are normal. He exhibits no distension. There is no tenderness. There is no rebound and no guarding.  Musculoskeletal: Normal range of motion. He exhibits no edema or tenderness.  Moves all extremities well.   Neurological: He is alert and oriented to person, place, and time. He has normal strength. No cranial nerve deficit.  Skin: Skin is warm, dry and intact. No rash noted. No erythema. No pallor.  Psychiatric: He has a normal mood and affect. His speech is normal and behavior is normal. His mood appears not anxious.  Nursing note and vitals reviewed.   ED Course  Procedures (including critical care time)  Medications  sodium chloride 0.9 % bolus 1,000 mL (0 mLs Intravenous Stopped 02/22/16 0322)  nitroGLYCERIN (NITROGLYN) 2 % ointment 1 inch (1 inch Topical Given 02/22/16 0024)     DIAGNOSTIC STUDIES: Oxygen Saturation is 98% on RA, normal by my interpretation.    COORDINATION OF CARE: 11:57 PM - Discussed treatment plan with pt at bedside. Pt verbalized understanding and agreed to plan.   Patient had nitroglycerin placed on his chest since he are to take an aspirin. He was given a liter of fluid for possible dehydration.  Recheck at 3 AM patient states the nitroglycerin got rid of his pain. He was ambulated by nursing staff and his pulse ox was in her percent on room air. It did not make him get more chest pain. He states he did get a little short of breath but states he needs to use his inhaler. He refused to get a nebulizer in the ED. Review of his prior study shows he has an echo showing a section fraction of 40%. His  cardiac cath in April shows that he has several lesions that are only 20%. He did have 100% RCA lesion. The cardiologist was wanting to treat him medically. Patient's delta troponins tonight are negative. Patient was sent home with  sublingual nitroglycerin and he is to follow-up with his cardiologist, Dr. Diona Browner early this coming week.    December 10, 2015 Cardiac Cath Coronary Findings    Dominance: Co-dominant   Left Main  Vessel was injected. Smooth 20% ostial narrowing of a long left main coronary artery   . Ost LM lesion, 20% stenosed.     Left Anterior Descending  Vessel was injected. The LAD is extensively calcified in the proximal to midsegment with mild 20% areas of luminal narrowing proximally. There are some collaterals to the distal RCA via the distal LAD   . Prox LAD to Mid LAD lesion, 20% stenosed. Calcified.   . Mid LAD lesion, 20% stenosed. Calcified.     Ramus Intermedius  Vessel was injected. Vessel is normal in caliber.     Left Circumflex  Vessel was injected. Mildly calcified without luminal stenosis     Right Coronary Artery  Vessel was injected. Proximal occlusion of the RCA with extensive bridging collaterals providing antegrade flow distally. There also are small. Retrograde left to right collaterals to the distal RCA.   . Mid RCA lesion, 100% stenosed.   . First Right Posterolateral   1st RPLB filled by collaterals from Dist LAD.       Labs Review Results for orders placed or performed during the hospital encounter of 02/21/16  Basic metabolic panel  Result Value Ref Range   Sodium 134 (L) 135 - 145 mmol/L   Potassium 3.7 3.5 - 5.1 mmol/L   Chloride 96 (L) 101 - 111 mmol/L   CO2 31 22 - 32 mmol/L   Glucose, Bld 111 (H) 65 - 99 mg/dL   BUN 18 6 - 20 mg/dL   Creatinine, Ser 4.80 (H) 0.61 - 1.24 mg/dL   Calcium 9.1 8.9 - 16.5 mg/dL   GFR calc non Af Amer 51 (L) >60 mL/min   GFR calc Af Amer 59 (L) >60 mL/min   Anion gap 7 5 - 15  CBC  Result  Value Ref Range   WBC 5.1 4.0 - 10.5 K/uL   RBC 4.12 (L) 4.22 - 5.81 MIL/uL   Hemoglobin 12.6 (L) 13.0 - 17.0 g/dL   HCT 53.7 (L) 48.2 - 70.7 %   MCV 92.0 78.0 - 100.0 fL   MCH 30.6 26.0 - 34.0 pg   MCHC 33.2 30.0 - 36.0 g/dL   RDW 86.7 54.4 - 92.0 %   Platelets 287 150 - 400 K/uL  Troponin I  Result Value Ref Range   Troponin I 0.03 <0.031 ng/mL  Troponin I  Result Value Ref Range   Troponin I 0.03 <0.031 ng/mL    Laboratory interpretation all normal except Stable mild anemia, stable renal insufficiency    Imaging Review Dg Chest 2 View  02/22/2016  CLINICAL DATA:  Acute onset of central chest pain, shortness of breath, nausea and diaphoresis. Initial encounter. EXAM: CHEST  2 VIEW COMPARISON:  Chest radiograph performed 12/08/2015 FINDINGS: The lungs are well-aerated. Mild vascular congestion is noted. There is no evidence of focal opacification, pleural effusion or pneumothorax. The heart is normal in size; a pacemaker/AICD is noted at the left chest wall, with leads ending at the right atrium and right ventricle. No acute osseous abnormalities are seen. Chronic bilateral rib deformities are noted. IMPRESSION: Mild vascular congestion noted.  Lungs remain grossly clear. Electronically Signed   By: Roanna Raider M.D.   On: 02/22/2016 00:30   I have personally reviewed and evaluated these images and lab  results as part of my medical decision-making.   EKG Interpretation None      ED ECG REPORT   Date: 02/22/2016  Rate: 86  Rhythm: normal sinus rhythm  QRS Axis: normal  Intervals: normal  ST/T Wave abnormalities: normal  Conduction Disutrbances:borderline repolarization abnormality  Narrative Interpretation:   Old EKG Reviewed: none available  I have personally reviewed the EKG tracing and agree with the computerized printout as noted.   MDM   Final diagnoses:  Chest pain, unspecified chest pain type    Discharge Medication List as of 02/22/2016  4:21 AM     START taking these medications   Details  nitroGLYCERIN (NITROSTAT) 0.4 MG SL tablet Place 1 tablet (0.4 mg total) under the tongue every 5 (five) minutes as needed for chest pain., Starting 02/22/2016, Until Discontinued, Print        Plan discharge  [name], MD, FACEP   I personally performed the services described in this documentation, which was scribed in my presence. The recorded information has been reviewed and considered.  [name], MD, Concha Pyo, MD 02/22/16 [phone]

## 2016-02-21 NOTE — ED Notes (Signed)
Chest pain started at 1600 central chest nausea and diaphoretic

## 2016-02-22 DIAGNOSIS — R0789 Other chest pain: Secondary | ICD-10-CM | POA: Diagnosis not present

## 2016-02-22 LAB — TROPONIN I: Troponin I: 0.03 ng/mL (ref <0.031)

## 2016-02-22 MED ORDER — NITROGLYCERIN 2 % TD OINT
1.0000 [in_us] | TOPICAL_OINTMENT | Freq: Once | TRANSDERMAL | Status: AC
  Administered 2016-02-22: 1 [in_us] via TOPICAL
  Filled 2016-02-22: qty 1

## 2016-02-22 MED ORDER — SODIUM CHLORIDE 0.9 % IV BOLUS (SEPSIS)
1000.0000 mL | Freq: Once | INTRAVENOUS | Status: AC
  Administered 2016-02-22: 1000 mL via INTRAVENOUS

## 2016-02-22 MED ORDER — NITROGLYCERIN 0.4 MG SL SUBL: 0.4000 mg | SUBLINGUAL_TABLET | SUBLINGUAL | Status: DC | PRN

## 2016-02-22 NOTE — Discharge Instructions (Signed)
Use the nitroglycerin one tablet under tongue every 5 minutes for 3 doses if you get the chest pain again. If that does not resolve your chest pain return to the emergency department. Call Dr. Ival Bible office on Monday, June 26, to have him recheck you earlier this week. Return to the hospital if you feel worse.

## 2016-02-22 NOTE — ED Notes (Signed)
When assisting patient up to ambulate a pill was lying on patient sheet. He stated it was a 60mg  Oxycontin, states he go it out earlier to take but "then the doctor came in so I didn't take it" denies taking anything while here; Dr. Lynelle Doctor informed. Pt ambulated around nursing station SPO2 remained 100% through ambulation patient states he feels " a little more short of breath than normal".

## 2016-02-27 ENCOUNTER — Telehealth: Payer: Self-pay | Admitting: Cardiology

## 2016-02-27 NOTE — Telephone Encounter (Signed)
Spoke w/ pt and requested that he send a manual transmission b/c him home monitor has not updated in at least 8 days.

## 2016-03-03 ENCOUNTER — Emergency Department (HOSPITAL_COMMUNITY): Payer: Medicare HMO

## 2016-03-03 ENCOUNTER — Emergency Department (HOSPITAL_COMMUNITY)
Admission: EM | Admit: 2016-03-03 | Discharge: 2016-03-03 | Disposition: A | Discharge status: Home / Self Care | Payer: Medicare HMO | Attending: Emergency Medicine | Admitting: Emergency Medicine

## 2016-03-03 ENCOUNTER — Encounter (HOSPITAL_COMMUNITY): Payer: Self-pay | Admitting: Emergency Medicine

## 2016-03-03 DIAGNOSIS — I10 Essential (primary) hypertension: Secondary | ICD-10-CM | POA: Diagnosis not present

## 2016-03-03 DIAGNOSIS — Z6827 Body mass index (BMI) 27.0-27.9, adult: Secondary | ICD-10-CM | POA: Insufficient documentation

## 2016-03-03 DIAGNOSIS — M1990 Unspecified osteoarthritis, unspecified site: Secondary | ICD-10-CM | POA: Diagnosis not present

## 2016-03-03 DIAGNOSIS — S62632A Displaced fracture of distal phalanx of right middle finger, initial encounter for closed fracture: Secondary | ICD-10-CM | POA: Insufficient documentation

## 2016-03-03 DIAGNOSIS — S92302A Fracture of unspecified metatarsal bone(s), left foot, initial encounter for closed fracture: Secondary | ICD-10-CM

## 2016-03-03 DIAGNOSIS — Z87891 Personal history of nicotine dependence: Secondary | ICD-10-CM | POA: Insufficient documentation

## 2016-03-03 DIAGNOSIS — F329 Major depressive disorder, single episode, unspecified: Secondary | ICD-10-CM | POA: Diagnosis not present

## 2016-03-03 DIAGNOSIS — Z7982 Long term (current) use of aspirin: Secondary | ICD-10-CM | POA: Insufficient documentation

## 2016-03-03 DIAGNOSIS — J449 Chronic obstructive pulmonary disease, unspecified: Secondary | ICD-10-CM | POA: Insufficient documentation

## 2016-03-03 DIAGNOSIS — Z79899 Other long term (current) drug therapy: Secondary | ICD-10-CM | POA: Insufficient documentation

## 2016-03-03 DIAGNOSIS — Y999 Unspecified external cause status: Secondary | ICD-10-CM | POA: Diagnosis not present

## 2016-03-03 DIAGNOSIS — Y939 Activity, unspecified: Secondary | ICD-10-CM | POA: Diagnosis not present

## 2016-03-03 DIAGNOSIS — E782 Mixed hyperlipidemia: Secondary | ICD-10-CM | POA: Insufficient documentation

## 2016-03-03 DIAGNOSIS — Z79891 Long term (current) use of opiate analgesic: Secondary | ICD-10-CM | POA: Insufficient documentation

## 2016-03-03 DIAGNOSIS — R51 Headache: Secondary | ICD-10-CM | POA: Diagnosis not present

## 2016-03-03 DIAGNOSIS — Y929 Unspecified place or not applicable: Secondary | ICD-10-CM | POA: Diagnosis not present

## 2016-03-03 DIAGNOSIS — S99922A Unspecified injury of left foot, initial encounter: Secondary | ICD-10-CM | POA: Diagnosis present

## 2016-03-03 DIAGNOSIS — S92345A Nondisplaced fracture of fourth metatarsal bone, left foot, initial encounter for closed fracture: Secondary | ICD-10-CM | POA: Insufficient documentation

## 2016-03-03 DIAGNOSIS — S62639A Displaced fracture of distal phalanx of unspecified finger, initial encounter for closed fracture: Secondary | ICD-10-CM

## 2016-03-03 DIAGNOSIS — I2510 Atherosclerotic heart disease of native coronary artery without angina pectoris: Secondary | ICD-10-CM | POA: Insufficient documentation

## 2016-03-03 DIAGNOSIS — E669 Obesity, unspecified: Secondary | ICD-10-CM | POA: Diagnosis not present

## 2016-03-03 MED ORDER — OXYCODONE HCL ER 60 MG PO T12A: 60.0000 mg | EXTENDED_RELEASE_TABLET | Freq: Two times a day (BID) | ORAL | Status: AC

## 2016-03-03 MED ORDER — OXYCODONE HCL 5 MG PO TABS
15.0000 mg | ORAL_TABLET | Freq: Once | ORAL | Status: AC
Start: 2016-03-03 — End: 2016-03-03
  Administered 2016-03-03: 15 mg via ORAL
  Filled 2016-03-03: qty 3

## 2016-03-03 MED ORDER — OXYCODONE HCL ER 20 MG PO T12A
60.0000 mg | EXTENDED_RELEASE_TABLET | Freq: Once | ORAL | Status: AC
  Administered 2016-03-03: 60 mg via ORAL
  Filled 2016-03-03: qty 3

## 2016-03-03 MED ORDER — OXYCODONE HCL 5 MG PO TABS: 15.0000 mg | ORAL_TABLET | Freq: Four times a day (QID) | ORAL | Status: DC | PRN

## 2016-03-03 NOTE — Discharge Instructions (Signed)
You need to call your primary care doctor tomorrow about obtaining refill on your pain medications. You are only given a prescription for this next day.   Return without fail for worsening symptoms, including escalating pain, new numbness or weakness, confusion, frequent falls or any other symptoms concerning to you.  Finger Fracture Fractures of fingers are breaks in the bones of the fingers. There are many types of fractures. There are different ways of treating these fractures. Your health care provider will discuss the best way to treat your fracture. CAUSES Traumatic injury is the main cause of broken fingers. These include:  Injuries while playing sports.  Workplace injuries.  Falls. RISK FACTORS Activities that can increase your risk of finger fractures include:  Sports.  Workplace activities that involve machinery.  A condition called osteoporosis, which can make your bones less dense and cause them to fracture more easily. SIGNS AND SYMPTOMS The main symptoms of a broken finger are pain and swelling within 15 minutes after the injury. Other symptoms include:  Bruising of your finger.  Stiffness of your finger.  Numbness of your finger.  Exposed bones (compound fracture) if the fracture is severe. DIAGNOSIS  The best way to diagnose a broken bone is with X-ray imaging. Additionally, your health care provider will use this X-ray image to evaluate the position of the broken finger bones.  TREATMENT  Finger fractures can be treated with:   Nonreduction--This means the bones are in place. The finger is splinted without changing the positions of the bone pieces. The splint is usually left on for about a week to 10 days. This will depend on your fracture and what your health care provider thinks.  Closed reduction--The bones are put back into position without using surgery. The finger is then splinted.  Open reduction and internal fixation--The fracture site is opened. Then  the bone pieces are fixed into place with pins or some type of hardware. This is seldom required. It depends on the severity of the fracture. HOME CARE INSTRUCTIONS   Follow your health care provider's instructions regarding activities, exercises, and physical therapy.  Only take over-the-counter or prescription medicines for pain, discomfort, or fever as directed by your health care provider. SEEK MEDICAL CARE IF: You have pain or swelling that limits the motion or use of your fingers. SEEK IMMEDIATE MEDICAL CARE IF:  Your finger becomes numb. MAKE SURE YOU:   Understand these instructions.  Will watch your condition.  Will get help right away if you are not doing well or get worse.   This information is not intended to replace advice given to you by your health care provider. Make sure you discuss any questions you have with your health care provider.   Document Released: 11/29/2000 Document Revised: 06/07/2013 Document Reviewed: 03/29/2013 Elsevier Interactive Patient Education 2016 [employer]  Metatarsal Fracture A metatarsal fracture is a break in a metatarsal bone. Metatarsal bones connect your toe bones to your ankle bones. CAUSES This type of fracture may be caused by:  A sudden twisting of your foot.  A fall onto your foot.  Overuse or repetitive exercise. RISK FACTORS This condition is more likely to develop in people who:  Play contact sports.  Have a bone disease.  Have a low calcium level. SYMPTOMS Symptoms of this condition include:  Pain that is worse when walking or standing.  Pain when pressing on the foot or moving the toes.  Swelling.  Bruising on the top or bottom of the  foot.  A foot that appears shorter than the other one. DIAGNOSIS This condition is diagnosed with a physical exam. You may also have imaging tests, such as:  X-rays.  A CT scan.  MRI. TREATMENT Treatment for this condition depends on its severity and whether a bone  has moved out of place. Treatment may involve:  Rest.  Wearing foot support such as a cast, splint, or boot for several weeks.  Using crutches.  Surgery to move bones back into the right position. Surgery is usually needed if there are many pieces of broken bone or bones that are very out of place (displaced fracture).  Physical therapy. This may be needed to help you regain full movement and strength in your foot. You will need to return to your health care provider to have X-rays taken until your bones heal. Your health care provider will look at the X-rays to make sure that your foot is healing well. HOME CARE INSTRUCTIONS  If You Have a Cast:  Do not stick anything inside the cast to scratch your skin. Doing that increases your risk of infection.  Check the skin around the cast every day. Report any concerns to your health care provider. You may put lotion on dry skin around the edges of the cast. Do not apply lotion to the skin underneath the cast.  Keep the cast clean and dry. If You Have a Splint or a Supportive Boot:  Wear it as directed by your health care provider. Remove it only as directed by your health care provider.  Loosen it if your toes become numb and tingle, or if they turn cold and blue.  Keep it clean and dry. Bathing  Do not take baths, swim, or use a hot tub until your health care provider approves. Ask your health care provider if you can take showers. You may only be allowed to take sponge baths for bathing.  If your health care provider approves bathing and showering, cover the cast or splint with a watertight plastic bag to protect it from water. Do not let the cast or splint get wet. Managing Pain, Stiffness, and Swelling  If directed, apply ice to the injured area (if you have a splint, not a cast).  Put ice in a plastic bag.  Place a towel between your skin and the bag.  Leave the ice on for 20 minutes, 2-3 times per day.  Move your toes often  to avoid stiffness and to lessen swelling.  Raise (elevate) the injured area above the level of your heart while you are sitting or lying down. Driving  Do not drive or operate heavy machinery while taking pain medicine.  Do not drive while wearing foot support on a foot that you use for driving. Activity  Return to your normal activities as directed by your health care provider. Ask your health care provider what activities are safe for you.  Perform exercises as directed by your health care provider or physical therapist. Safety  Do not use the injured foot to support your body weight until your health care provider says that you can. Use crutches as directed by your health care provider. General Instructions  Do not put pressure on any part of the cast or splint until it is fully hardened. This may take several hours.  Do not use any tobacco products, including cigarettes, chewing tobacco, or e-cigarettes. Tobacco can delay bone healing. If you need help quitting, ask your health care provider.  Take medicines  only as directed by your health care provider.  Keep all follow-up visits as directed by your health care provider. This is important. SEEK MEDICAL CARE IF:  You have a fever.  Your cast, splint, or boot is too loose or too tight.  Your cast, splint, or boot is damaged.  Your pain medicine is not helping.  You have pain, tingling, or numbness in your foot that is not going away. SEEK IMMEDIATE MEDICAL CARE IF:  You have severe pain.  You have tingling or numbness in your foot that is getting worse.  Your foot feels cold or becomes numb.  Your foot changes color.   This information is not intended to replace advice given to you by your health care provider. Make sure you discuss any questions you have with your health care provider.   Document Released: 05/09/2002 Document Revised: 01/01/2015 Document Reviewed: 06/13/2014 Elsevier Interactive Patient Education  [employer]
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
## 2016-03-03 NOTE — ED Notes (Signed)
Cleaned laceration to head and cleaned superficial lacerations to both arms.  Deputy in to talk to pt.
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
## 2016-03-13 ENCOUNTER — Encounter: Payer: Medicare HMO | Admitting: Internal Medicine

## 2016-03-13 DIAGNOSIS — R0989 Other specified symptoms and signs involving the circulatory and respiratory systems: Secondary | ICD-10-CM

## 2016-03-20 ENCOUNTER — Telehealth: Payer: Self-pay | Admitting: Cardiology

## 2016-03-20 NOTE — Telephone Encounter (Signed)
LMOVM requesting that pt send manual transmission b/c home monitor has not updated in at least 8 days.   

## 2016-04-03 ENCOUNTER — Telehealth: Payer: Self-pay | Admitting: Cardiology

## 2016-04-03 NOTE — Telephone Encounter (Signed)
Spoke w/ pt and requested that he send a manual transmission b/c his home monitor has not updated in at least 8 days. Pt stated that he needs a cell adapter. I informed him that I would need to have it ordered. Pt verbalized understanding.

## 2016-04-20 ENCOUNTER — Other Ambulatory Visit: Payer: Self-pay | Admitting: Cardiology

## 2016-04-20 NOTE — Progress Notes (Signed)
No ICM remote transmission received for 04/14/2016 and next one scheduled for 05/13/2016.

## 2016-05-13 ENCOUNTER — Telehealth: Payer: Self-pay

## 2016-05-13 NOTE — Telephone Encounter (Signed)
Call to patient and requested to send ICM remote transmission today.  He reported the monitor is not working.  Provided [employer] tech support number.  He will try to send a transmission and if it does not work, will call support number. Provided ICM phone number.

## 2016-05-15 NOTE — Progress Notes (Signed)
No ICM remote transmission received on 05/13/2016.   Next ICM transmission scheduled for 06/04/2016.

## 2016-06-09 NOTE — Progress Notes (Signed)
No ICM remote transmission received on 06/04/2016.   Next ICM transmission scheduled for 06/18/2016.

## 2016-06-18 ENCOUNTER — Ambulatory Visit (INDEPENDENT_AMBULATORY_CARE_PROVIDER_SITE_OTHER): Payer: Medicare HMO

## 2016-06-18 ENCOUNTER — Telehealth: Payer: Self-pay | Admitting: Cardiology

## 2016-06-18 DIAGNOSIS — I5022 Chronic systolic (congestive) heart failure: Secondary | ICD-10-CM | POA: Diagnosis not present

## 2016-06-18 DIAGNOSIS — Z95810 Presence of automatic (implantable) cardiac defibrillator: Secondary | ICD-10-CM | POA: Diagnosis not present

## 2016-06-18 NOTE — Telephone Encounter (Signed)
Spoke with pt and reminded pt of remote transmission that is due today. Pt verbalized understanding.   

## 2016-06-19 NOTE — Progress Notes (Signed)
EPIC Encounter for ICM Monitoring  Patient Name: [name] is a 70 y.o. male Date: 06/19/2016 Primary Care Physican: Samuel Jester, DO Primary Cardiologist: Diona Browner Electrophysiologist: Allred Dry Weight: Not weighing AT/AF Burden <1% Mode Switch 1.8% AMS Episodes 73419       Heart Failure questions reviewed, pt asymptomatic   Thoracic impedance normal starting 06/15/2016.   Impedance was abnormal suggesting fluid accumulation 10/7 to 10/16.  He stated he has been eating too much salt  Recommendations: No changes.  Advised to limit salt intake to 2000 mg daily.  Encouraged to call for fluid symptoms.    Follow-up plan: ICM clinic phone appointment on 08/04/2016.  Office appointment with Dr Johney Frame on 07/03/2016.   Copy of ICM check sent to device physician.   ICM trend: 06/18/2016       Karie Soda, RN 06/19/2016 8:06 AM

## 2016-07-03 ENCOUNTER — Encounter: Payer: Self-pay | Admitting: Internal Medicine

## 2016-07-03 ENCOUNTER — Ambulatory Visit (INDEPENDENT_AMBULATORY_CARE_PROVIDER_SITE_OTHER): Payer: Medicare HMO | Admitting: Internal Medicine

## 2016-07-03 DIAGNOSIS — I429 Cardiomyopathy, unspecified: Secondary | ICD-10-CM | POA: Diagnosis not present

## 2016-07-03 DIAGNOSIS — I5022 Chronic systolic (congestive) heart failure: Secondary | ICD-10-CM

## 2016-07-03 LAB — CUP PACEART INCLINIC DEVICE CHECK
Battery Remaining Longevity: 32 mo
Brady Statistic RA Percent Paced: 53 %
Date Time Interrogation Session: 20171103133904
HIGH POWER IMPEDANCE MEASURED VALUE: 62.1563
Implantable Lead Implant Date: 20101019
Implantable Lead Implant Date: 20101019
Implantable Lead Location: 753859
Implantable Pulse Generator Implant Date: 20101019
Lead Channel Impedance Value: 437.5 Ohm
Lead Channel Pacing Threshold Pulse Width: 0.5 ms
Lead Channel Pacing Threshold Pulse Width: 0.8 ms
Lead Channel Setting Pacing Amplitude: 2.5 V
Lead Channel Setting Pacing Pulse Width: 0.8 ms
Lead Channel Setting Sensing Sensitivity: 0.5 mV
MDC IDC LEAD LOCATION: 753860
MDC IDC LEAD MODEL: 7121
MDC IDC MSMT LEADCHNL RA IMPEDANCE VALUE: 300 Ohm
MDC IDC MSMT LEADCHNL RA PACING THRESHOLD AMPLITUDE: 0.75 V
MDC IDC MSMT LEADCHNL RA PACING THRESHOLD AMPLITUDE: 0.75 V
MDC IDC MSMT LEADCHNL RA PACING THRESHOLD PULSEWIDTH: 0.5 ms
MDC IDC MSMT LEADCHNL RA SENSING INTR AMPL: 4.2 mV
MDC IDC MSMT LEADCHNL RV PACING THRESHOLD AMPLITUDE: 1.25 V
MDC IDC MSMT LEADCHNL RV PACING THRESHOLD AMPLITUDE: 1.25 V
MDC IDC MSMT LEADCHNL RV PACING THRESHOLD PULSEWIDTH: 0.8 ms
MDC IDC MSMT LEADCHNL RV SENSING INTR AMPL: 11.7 mV
MDC IDC SET LEADCHNL RA PACING AMPLITUDE: 2 V
MDC IDC STAT BRADY RV PERCENT PACED: 2.7 %
Pulse Gen Serial Number: 593609

## 2016-07-03 NOTE — Patient Instructions (Signed)
Medication Instructions:  none  Labwork: none  Testing/Procedures: none  Follow-Up: Your physician wants you to follow up in:  1 year.  You will receive a reminder letter in the mail one-two months in advance.  If you don't receive a letter, please call our office to schedule the follow up appointment - Dr. Allred.  Any Other Special Instructions Will Be Listed Below (If Applicable). Remote monitoring is used to monitor your Pacemaker of ICD from home. This monitoring reduces the number of office visits required to check your device to one time per year. It allows us to keep an eye on the functioning of your device to ensure it is working properly. You are scheduled for a device check from home on 10/05/2016. You may send your transmission at any time that day. If you have a wireless device, the transmission will be sent automatically. After your physician reviews your transmission, you will receive a postcard with your next transmission date.  If you need a refill on your cardiac medications before your next appointment, please call your pharmacy.  

## 2016-07-03 NOTE — Progress Notes (Signed)
PCP:  CYNTHIA BUTLER, DO Primary Cardiologist: Dr Diona Browner  The patient presents today for routine electrophysiology followup.  Since last being seen in our clinic, the patient reports doing reasonably well.  He is here with his young wife today.  States that he is doing well.  He has trouble ambulating due to feet problems. Today, he denies symptoms of palpitations, orthopnea, PND, dizziness, presyncope, syncope, or ICD shocks.  He has stable mild edema. The patient feels that he is tolerating medications without difficulties and is otherwise without complaint today.   Past Medical History:  Diagnosis Date  . ACTH deficiency (HCC)    Dr. Lucianne Muss  . Cardiomyopathy    Mixed, LVEF 35% up to 50%  . COPD (chronic obstructive pulmonary disease) (HCC)   . Coronary atherosclerosis of native coronary artery    Occluded RCA with good collaterals  . DDD (degenerative disc disease)   . Depression   . Essential hypertension, benign   . Full dentures   . GERD (gastroesophageal reflux disease)   . H/O alcohol abuse   . High cholesterol   . ICD (implantable cardiac defibrillator) in place   . Mixed hyperlipidemia   . Obesity   . Osteoarthritis   . Sleep apnea    Noncompliance with CPAP therapy  . Wears glasses    Past Surgical History:  Procedure Laterality Date  . ANKLE SURGERY  right  . BACK SURGERY     Multiple back surgeries for chronic back pain and lumbar fusion  . CARDIAC CATHETERIZATION N/A 12/10/2015   Procedure: Left Heart Cath and Coronary Angiography;  Surgeon: Lennette Bihari, MD;  Location: [facility] INVASIVE CV LAB;  Service: Cardiovascular;  Laterality: N/A;  . FEMUR SURGERY  right  . FOOT ARTHRODESIS Right 11/10/2012   Procedure: Right Hallux Metatarsal-phalangeal Joint Arthrodesis; Second Metatarsal Head Resection and Second, Third and Fourth Metatarsal Weil Osteotomy;  Surgeon: Toni Arthurs, MD;  Location: [facility];  Service: Orthopedics;  Laterality: Right;  .  HAMMER TOE SURGERY Right 11/10/2012   Procedure: Right Second, Third, and Fourth Hammertoe Corrections;  Surgeon: Toni Arthurs, MD;  Location:  SURGERY CENTER;  Service: Orthopedics;  Laterality: Right;  . ICD implantation  06/18/2009   St Jude Fortify DR implanted by Dr. Johney Frame  . KNEE SURGERY    . NASAL SINUS SURGERY    . TRANSURETHRAL RESECTION OF PROSTATE  2013  . UMBILICAL HERNIA REPAIR      Current Outpatient Prescriptions  Medication Sig Dispense Refill  . amitriptyline (ELAVIL) 25 MG tablet Take 25 mg by mouth at bedtime as needed for sleep.    Marland Kitchen aspirin EC 81 MG tablet Take 81 mg by mouth daily.    . Aspirin-Salicylamide-Caffeine (BC HEADACHE) 325-95-16 MG TABS Take 1 packet by mouth daily as needed (for pain).    . busPIRone (BUSPAR) 10 MG tablet Take 10 mg by mouth daily.     . Cholecalciferol (VITAMIN D3) 5000 UNITS CAPS Take 1 capsule by mouth daily.    . fenofibrate 160 MG tablet Take 160 mg by mouth daily.      . furosemide (LASIX) 80 MG tablet Take 80 mg by mouth daily.    Marland Kitchen gabapentin (NEURONTIN) 600 MG tablet Take 600 mg by mouth 3 (three) times daily.    . metoprolol succinate (TOPROL-XL) 25 MG 24 hr tablet TAKE 1 TABLET BY MOUTH EVERY DAY 30 tablet 9  . nitroGLYCERIN (NITROSTAT) 0.4 MG SL tablet Place 1 tablet (0.4  mg total) under the tongue every 5 (five) minutes as needed for chest pain. 30 tablet 0  . omeprazole (PRILOSEC) 20 MG capsule Take 20 mg by mouth 2 (two) times daily.    Marland Kitchen. oxyCODONE (OXYCONTIN) 60 MG 12 hr tablet Take 60 mg by mouth 2 (two) times daily. 5 each 0  . oxyCODONE (ROXICODONE) 15 MG immediate release tablet Take 15 mg by mouth 4 (four) times daily.     Marland Kitchen. PROAIR HFA 108 (90 Base) MCG/ACT inhaler Inhale 1-2 puffs into the lungs every 6 (six) hours as needed for wheezing or shortness of breath.     . rosuvastatin (CRESTOR) 20 MG tablet Take 20 mg by mouth daily.    Marland Kitchen. senna-docusate (SENOKOT-S) 8.6-50 MG tablet Take 2 tablets by mouth 2 (two)  times daily. (Patient taking differently: Take 2 tablets by mouth at bedtime. ) 20 tablet 1  . tamsulosin (FLOMAX) 0.4 MG CAPS Take 0.4 mg by mouth daily after supper.    . temazepam (RESTORIL) 30 MG capsule Take 30 mg by mouth at bedtime as needed for sleep.    Marland Kitchen. venlafaxine XR (EFFEXOR-XR) 150 MG 24 hr capsule Take 150 mg by mouth daily.     No current facility-administered medications for this visit.     Social History   Social History  . Marital status: Divorced    Spouse name: N/A  . Number of children: N/A  . Years of education: N/A   Occupational History  . Not on file.   Social History Main Topics  . Smoking status: Former Smoker    Packs/day: 1.00    Years: 36.00    Types: Cigarettes    Quit date: 05/26/1997  . Smokeless tobacco: Never Used  . Alcohol use No     Comment: History of abuse. Still admits to occasional binges (denies 12/08/15)  . Drug use: No  . Sexual activity: Not on file   Other Topics Concern  . Not on file   Social History Narrative  . No narrative on file    Family History  Problem Relation Age of Onset  . Coronary artery disease    . Arthritis      Physical Exam: Vitals:   07/03/16 0939  BP: 117/76  Pulse: 69  SpO2: 99%  Weight: 194 lb 3.2 oz (88.1 kg)  Height: 5\' 9"  (1.753 m)    GEN- The patient is well appearing, alert and oriented x 3 today.   Head- normocephalic, atraumatic Eyes-  Sclera clear, conjunctiva pink Ears- hearing intact Oropharynx- clear Neck- supple,   Lungs- Clear to ausculation bilaterally, normal work of breathing Chest- ICD pocket is well healed Heart- Regular rate and rhythm, 2/6 SEM at the apex GI- soft, NT, ND, + BS Extremities- no clubbing, cyanosis, or edema  ICD interrogation- reviewed in detail today,  See PACEART report  Assessment and Plan:  1. Mixed cardiomyopathy (ischemic/ nonischemic) with EF recovery Chronic NYHA Class II CHF ETOH avoidance Normal ICD function See Pace Art  report  2. CAD As above  3. HTN Stable No change required today  Merlin follow-up Return to see me in a year  Hillis RangeJames Briannon Boggio MD, [facility] 07/03/2016 10:25 AM

## 2016-07-30 ENCOUNTER — Telehealth: Payer: Self-pay | Admitting: Cardiology

## 2016-07-30 NOTE — Telephone Encounter (Signed)
Spoke w/ pt and requested that he send a manual transmission b/c his home monitor has not updated in at least 7 days.   

## 2016-08-04 ENCOUNTER — Ambulatory Visit (INDEPENDENT_AMBULATORY_CARE_PROVIDER_SITE_OTHER): Payer: Medicare HMO

## 2016-08-04 ENCOUNTER — Telehealth: Payer: Self-pay | Admitting: Cardiology

## 2016-08-04 DIAGNOSIS — I5022 Chronic systolic (congestive) heart failure: Secondary | ICD-10-CM | POA: Diagnosis not present

## 2016-08-04 DIAGNOSIS — Z95810 Presence of automatic (implantable) cardiac defibrillator: Secondary | ICD-10-CM

## 2016-08-04 NOTE — Telephone Encounter (Signed)
Spoke with pt and reminded pt of remote transmission that is due today. Pt verbalized understanding.   

## 2016-08-04 NOTE — Progress Notes (Signed)
EPIC Encounter for ICM Monitoring  Patient Name: [name] is a 70 y.o. male Date: 08/04/2016 Primary Care Physican: Samuel Jester, DO Primary Cardiologist:McDowell Electrophysiologist: Allred Dry Weight:    Not weighing          Heart Failure questions reviewed, pt symptomatic with increase in shortness of breath when walking  Thoracic impedance abnormal suggesting fluid accumulation up until 12/4 but he continues to have shortness of breath.  LABS: 02/21/2016 Creatinine 1.38, BUN 18, Potassium 3.7, Sodium 134 12/11/2015 Creatinine 0.95, BUN 7,   Potassium 4.1, Sodium 142 12/10/2015 Creatinine 1.19, BUN 10, Potassium 3.9, Sodium 141 12/09/2015 Creatinine 1.15, BUN 11, Potassium 3.4, Sodium 141 12/08/2015 Creatinine 1.28, BUN 18,   Potassium 3.1, Sodium 139  Recommendations:  Patient reported he was advised by Dr Diona Browner to take additional 1/2 tablet (40 mg total) of Furosemide 80 mg for fluid symptoms.  He stated he will take additional 1/2 tablet today and tomorrow.   Follow-up plan: ICM clinic phone appointment on 08/07/2016 to recheck fluid levels.  Copy of ICM check sent to primary cardiologist and device physician.   ICM trend: 08/04/2016       Karie Soda, RN 08/04/2016 3:56 PM

## 2016-08-07 ENCOUNTER — Telehealth: Payer: Self-pay

## 2016-08-07 ENCOUNTER — Ambulatory Visit (INDEPENDENT_AMBULATORY_CARE_PROVIDER_SITE_OTHER): Payer: Medicare HMO

## 2016-08-07 DIAGNOSIS — I5022 Chronic systolic (congestive) heart failure: Secondary | ICD-10-CM

## 2016-08-07 DIAGNOSIS — Z95810 Presence of automatic (implantable) cardiac defibrillator: Secondary | ICD-10-CM

## 2016-08-07 NOTE — Telephone Encounter (Signed)
Left voice mail message requesting a remote ICM transmission to be sent today before 3pm.  Provided direct call back number

## 2016-08-11 NOTE — Progress Notes (Signed)
EPIC Encounter for ICM Monitoring  Patient Name: [name] is a 70 y.o. male Date: 08/11/2016 Primary Care Physican: Samuel Jester, DO Primary Cardiologist:McDowell Electrophysiologist: Allred  Dry Weight:Not weighing     Follow up call to patient.  Heart Failure questions reviewed, pt still feels a little short of breath.    Thoracic impedance continues to be abnormal suggesting fluid accumulation but did improve after taking additional 40 mg total of Furosemide.  Patient only took extra Furosemide for 1 day instead of 2 as recommended on 12/5.    LABS: 02/21/2016 Creatinine 1.38, BUN 18, Potassium 3.7, Sodium 134 12/11/2015 Creatinine 0.95, BUN 7,   Potassium 4.1, Sodium 142 12/10/2015 Creatinine 1.19, BUN 10, Potassium 3.9, Sodium 141 12/09/2015 Creatinine 1.15, BUN 11, Potassium 3.4, Sodium 141 12/08/2015 Creatinine 1.28, BUN 18,   Potassium 3.1, Sodium 139  Recommendations: Requested he send a new transmission today to recheck for changes in fluid levels since Friday, 08/07/2016.    Follow-up plan: ICM clinic phone appointment on pending today's transmission.  Copy of ICM check sent to primary cardiologist and device physician.   ICM trend: 08/07/2016       Karie Soda, RN 08/11/2016 7:53 AM

## 2016-08-11 NOTE — Progress Notes (Signed)
Received updated transmission today.  Impedance back at baseline and showing normal.  He stated he feels ok but still a little shortness of breath.  He will take extra 40 mg of Furosemide if needed.  No changes today.     Next ICM remote transmission 09/04/2016.

## 2016-09-04 ENCOUNTER — Telehealth: Payer: Self-pay | Admitting: Cardiology

## 2016-09-04 NOTE — Telephone Encounter (Signed)
LMOVM reminding pt to send remote transmission.   

## 2016-09-11 NOTE — Progress Notes (Signed)
No ICM remote transmission received on 09/04/2016.   Next ICM transmission scheduled for 10/05/2016.
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
## 2016-09-24 ENCOUNTER — Telehealth: Payer: Self-pay | Admitting: Cardiology

## 2016-09-24 NOTE — Telephone Encounter (Signed)
Spoke w/ pt and requested that he send a manual transmission b/c his home monitor has not updated in at least 14 days.   

## 2016-10-05 ENCOUNTER — Telehealth: Payer: Self-pay | Admitting: Cardiology

## 2016-10-05 ENCOUNTER — Ambulatory Visit (INDEPENDENT_AMBULATORY_CARE_PROVIDER_SITE_OTHER): Payer: Medicare HMO | Admitting: *Deleted

## 2016-10-05 DIAGNOSIS — I429 Cardiomyopathy, unspecified: Secondary | ICD-10-CM | POA: Diagnosis not present

## 2016-10-05 DIAGNOSIS — I5022 Chronic systolic (congestive) heart failure: Secondary | ICD-10-CM | POA: Diagnosis not present

## 2016-10-05 DIAGNOSIS — Z95810 Presence of automatic (implantable) cardiac defibrillator: Secondary | ICD-10-CM

## 2016-10-05 NOTE — Telephone Encounter (Signed)
Spoke with pt and reminded pt of remote transmission that is due today. Pt verbalized understanding.   

## 2016-10-07 ENCOUNTER — Encounter: Payer: Self-pay | Admitting: Cardiology

## 2016-10-07 ENCOUNTER — Encounter: Payer: Self-pay | Admitting: *Deleted

## 2016-10-07 LAB — CUP PACEART INCLINIC DEVICE CHECK
Brady Statistic RA Percent Paced: 58 %
HIGH POWER IMPEDANCE MEASURED VALUE: 57 Ohm
Implantable Lead Implant Date: 20101019
Implantable Lead Location: 753860
Implantable Lead Model: 7121
Implantable Pulse Generator Implant Date: 20101019
Lead Channel Impedance Value: 300 Ohm
Lead Channel Impedance Value: 400 Ohm
Lead Channel Sensing Intrinsic Amplitude: 3.3 mV
Lead Channel Setting Pacing Amplitude: 2 V
Lead Channel Setting Pacing Amplitude: 2.5 V
Lead Channel Setting Pacing Pulse Width: 0.8 ms
MDC IDC LEAD IMPLANT DT: 20101019
MDC IDC LEAD LOCATION: 753859
MDC IDC MSMT LEADCHNL RV SENSING INTR AMPL: 11.7 mV
MDC IDC SESS DTM: 20180207095742
MDC IDC SET LEADCHNL RV SENSING SENSITIVITY: 0.5 mV
MDC IDC STAT BRADY RV PERCENT PACED: 3.8 %
Pulse Gen Serial Number: 593609

## 2016-10-07 NOTE — Progress Notes (Deleted)
Cardiology Office Note  Date: 10/07/2016   ID: Cordero, Surette [birth year], MRN [id number]  PCP: Om Lizotte Jester, DO  Primary Cardiologist: Nona Dell, MD   No chief complaint on file.   History of Present Illness: [name] is a 71 y.o. male last seen in April 2017.  He continues to follow in the device clinic with Dr. Johney Frame, St. Jude ICD in place.  Past Medical History:  Diagnosis Date  . ACTH deficiency (HCC)    Dr. Lucianne Muss  . Cardiomyopathy    Mixed, LVEF 35% up to 50%  . COPD (chronic obstructive pulmonary disease) (HCC)   . Coronary atherosclerosis of native coronary artery    Occluded RCA with good collaterals  . DDD (degenerative disc disease)   . Depression   . Essential hypertension, benign   . Full dentures   . GERD (gastroesophageal reflux disease)   . H/O alcohol abuse   . High cholesterol   . ICD (implantable cardiac defibrillator) in place   . Mixed hyperlipidemia   . Obesity   . Osteoarthritis   . Sleep apnea    Noncompliance with CPAP therapy  . Wears glasses     Past Surgical History:  Procedure Laterality Date  . ANKLE SURGERY  right  . BACK SURGERY     Multiple back surgeries for chronic back pain and lumbar fusion  . CARDIAC CATHETERIZATION N/A 12/10/2015   Procedure: Left Heart Cath and Coronary Angiography;  Surgeon: Lennette Bihari, MD;  Location: [facility] INVASIVE CV LAB;  Service: Cardiovascular;  Laterality: N/A;  . FEMUR SURGERY  right  . FOOT ARTHRODESIS Right 11/10/2012   Procedure: Right Hallux Metatarsal-phalangeal Joint Arthrodesis; Second Metatarsal Head Resection and Second, Third and Fourth Metatarsal Weil Osteotomy;  Surgeon: Toni Arthurs, MD;  Location: [facility];  Service: Orthopedics;  Laterality: Right;  . HAMMER TOE SURGERY Right 11/10/2012   Procedure: Right Second, Third, and Fourth Hammertoe Corrections;  Surgeon: Toni Arthurs, MD;  Location: [facility];  Service: Orthopedics;   Laterality: Right;  . ICD implantation  06/18/2009   St Jude Fortify DR implanted by Dr. Johney Frame  . KNEE SURGERY    . NASAL SINUS SURGERY    . TRANSURETHRAL RESECTION OF PROSTATE  2013  . UMBILICAL HERNIA REPAIR      Current Outpatient Prescriptions  Medication Sig Dispense Refill  . amitriptyline (ELAVIL) 25 MG tablet Take 25 mg by mouth at bedtime as needed for sleep.    Marland Kitchen aspirin EC 81 MG tablet Take 81 mg by mouth daily.    . Aspirin-Salicylamide-Caffeine (BC HEADACHE) 325-95-16 MG TABS Take 1 packet by mouth daily as needed (for pain).    . busPIRone (BUSPAR) 10 MG tablet Take 10 mg by mouth daily.     . Cholecalciferol (VITAMIN D3) 5000 UNITS CAPS Take 1 capsule by mouth daily.    . fenofibrate 160 MG tablet Take 160 mg by mouth daily.      . furosemide (LASIX) 80 MG tablet Take 80 mg by mouth daily.    Marland Kitchen gabapentin (NEURONTIN) 600 MG tablet Take 600 mg by mouth 3 (three) times daily.    . metoprolol succinate (TOPROL-XL) 25 MG 24 hr tablet TAKE 1 TABLET BY MOUTH EVERY DAY 30 tablet 9  . nitroGLYCERIN (NITROSTAT) 0.4 MG SL tablet Place 1 tablet (0.4 mg total) under the tongue every 5 (five) minutes as needed for chest pain. 30 tablet 0  . omeprazole (PRILOSEC) 20  MG capsule Take 20 mg by mouth 2 (two) times daily.    Marland Kitchen oxyCODONE (OXYCONTIN) 60 MG 12 hr tablet Take 60 mg by mouth 2 (two) times daily. 5 each 0  . oxyCODONE (ROXICODONE) 15 MG immediate release tablet Take 15 mg by mouth 4 (four) times daily.     Marland Kitchen PROAIR HFA 108 (90 Base) MCG/ACT inhaler Inhale 1-2 puffs into the lungs every 6 (six) hours as needed for wheezing or shortness of breath.     . rosuvastatin (CRESTOR) 20 MG tablet Take 20 mg by mouth daily.    Marland Kitchen senna-docusate (SENOKOT-S) 8.6-50 MG tablet Take 2 tablets by mouth 2 (two) times daily. (Patient taking differently: Take 2 tablets by mouth at bedtime. ) 20 tablet 1  . tamsulosin (FLOMAX) 0.4 MG CAPS Take 0.4 mg by mouth daily after supper.    . temazepam  (RESTORIL) 30 MG capsule Take 30 mg by mouth at bedtime as needed for sleep.    Marland Kitchen venlafaxine XR (EFFEXOR-XR) 150 MG 24 hr capsule Take 150 mg by mouth daily.     No current facility-administered medications for this visit.    Allergies:  Penicillins   Social History: The patient  reports that he quit smoking about 19 years ago. His smoking use included Cigarettes. He has a 36.00 pack-year smoking history. He has never used smokeless tobacco. He reports that he does not drink alcohol or use drugs.   Family History: The patient's family history is not on file.   ROS:  Please see the history of present illness. Otherwise, complete review of systems is positive for {NONE DEFAULTED:18576::"none"}.  All other systems are reviewed and negative.   Physical Exam: VS:  There were no vitals taken for this visit., BMI There is no height or weight on file to calculate BMI.  Wt Readings from Last 3 Encounters:  07/03/16 194 lb 3.2 oz (88.1 kg)  03/03/16 190 lb (86.2 kg)  02/21/16 190 lb (86.2 kg)    Overweight male, comfortable at rest.  HEENT: Conjunctiva and lids normal, oropharynx clear.  Neck: Supple, no elevated JVP, no thyromegaly.  Lungs: Decreased but clear to auscultation, nonlabored breathing at rest.  Cardiac: Regular rate and rhythm with frequent ectopy, no S3, no pericardial rub.  Abdomen: Obese, nontender, bowel sounds present, no guarding or rebound.  Extremities: Trace to 1+ edema, distal pulses 1+.   ECG: I personally reviewed the tracing from 02/21/2016 which showed sinus rhythm with nonspecific ST changes.  Recent Labwork: 12/09/2015: ALT 16; AST 33 12/10/2015: Magnesium 1.9 02/21/2016: BUN 18; Creatinine, Ser 1.38; Hemoglobin 12.6; Platelets 287; Potassium 3.7; Sodium 134   Other Studies Reviewed Today:  Cardiac catheterization 12/10/2015:  Mid RCA lesion, 100% stenosed.  Ost LM lesion, 20% stenosed.  Prox LAD to Mid LAD lesion, 20% stenosed.  Mid LAD lesion,  20% stenosed.  Moderate LV dysfunction with focal significant hypokinesis in the mid inferior wall and mild distal anterolateral hypocontractility with an overall ejection fraction at 40%.  Significant coronary calcification with ostial smooth 20% narrowing of the left main; significant calcification of the LAD, proximal legs and into the mid segment with segmental 20% areas of luminal irregularity; normal ramus intermediate vessel; no significant stenoses in the left circumflex coronary artery; and total proximal occlusion of a moderate size RCA with extensive bridging collaterals supplying antegrade flow to the PDA and faint retrograde collaterals to the distal RCA via the left circulation.  RECOMMENDATION: Medical therapy.  Echocardiogram 11/02/2014: Study Conclusions  -  Left ventricle: The cavity size was normal. Wall thickness was normal. Systolic function was normal. The estimated ejection fraction was in the range of 55% to 60%. Wall motion was normal; there were no regional wall motion abnormalities. Left ventricular diastolic function parameters were normal. - Aortic valve: Valve area (VTI): 3.78 cm^2. Valve area (Vmax): 3.44 cm^2. Valve area (Vmean): 3.59 cm^2. - Mitral valve: There was mild regurgitation directed posteriorly. - Left atrium: The atrium was moderately dilated. - Atrial septum: No defect or patent foramen ovale was identified. - Technically adequate study.  Assessment and Plan:    Current medicines were reviewed with the patient today.  No orders of the defined types were placed in this encounter.   Disposition:  Signed, Jonelle Sidle, MD, [facility] 10/07/2016 12:41 PM    [facility] at [facility] [address] [place], [place], [place] [address] Phone: [phone]; Fax: [phone]

## 2016-10-07 NOTE — Progress Notes (Signed)
Remote ICD transmission.   

## 2016-10-08 ENCOUNTER — Ambulatory Visit: Payer: Medicare HMO | Admitting: Cardiology

## 2016-10-08 NOTE — Progress Notes (Signed)
EPIC Encounter for ICM Monitoring  Patient Name: [name] is a 71 y.o. male Date: 10/08/2016 Primary Care Physican: Samuel Jester, DO Primary Cardiologist:McDowell Electrophysiologist: Allred   Dry Weight:Not weighing          Heart Failure questions reviewed, pt asymptomatic   Thoracic impedance normal   LABS: 02/21/2016 Creatinine 1.38, BUN 18, Potassium 3.7, Sodium 134 12/11/2015 Creatinine 0.95, BUN 7, Potassium 4.1, Sodium 142 12/10/2015 Creatinine 1.19, BUN 10, Potassium 3.9, Sodium 141 12/09/2015 Creatinine 1.15, BUN 11, Potassium 3.4, Sodium 141 12/08/2015 Creatinine 1.28, BUN 18, Potassium 3.1, Sodium 139  Recommendations: No changes. Reminded to limit dietary salt intake to 2000 mg/day and fluid intake to < 2 liters/day. Encouraged to call for fluid symptoms.  Follow-up plan: ICM clinic phone appointment on 11/10/2016, same day as appt with Dr Diona Browner.  Copy of ICM check sent to device physician.   3 month ICM trend: 10/06/2016   1 Year ICM trend:      Karie Soda, RN 10/08/2016 2:51 PM

## 2016-11-09 NOTE — Progress Notes (Deleted)
Cardiology Office Note  Date: 11/09/2016   ID: Maico, Wecker [birth year], MRN [id number]  PCP: Farzana Koci Jester, DO  Primary Cardiologist: Nona Dell, MD   No chief complaint on file.   History of Present Illness: [name] is a 71 y.o. male that I last saw in April 2017. He presents overdue for a follow-up visit.  Follow-up continues in the device clinic with Dr. Johney Frame, St. Jude ICD in place.  Past Medical History:  Diagnosis Date  . ACTH deficiency (HCC)    Dr. Lucianne Muss  . Cardiomyopathy    Mixed, LVEF 35% up to 50%  . COPD (chronic obstructive pulmonary disease) (HCC)   . Coronary atherosclerosis of native coronary artery    Occluded RCA with good collaterals  . DDD (degenerative disc disease)   . Depression   . Essential hypertension, benign   . Full dentures   . GERD (gastroesophageal reflux disease)   . H/O alcohol abuse   . High cholesterol   . ICD (implantable cardiac defibrillator) in place   . Mixed hyperlipidemia   . Obesity   . Osteoarthritis   . Sleep apnea    Noncompliance with CPAP therapy  . Wears glasses     Past Surgical History:  Procedure Laterality Date  . ANKLE SURGERY  right  . BACK SURGERY     Multiple back surgeries for chronic back pain and lumbar fusion  . CARDIAC CATHETERIZATION N/A 12/10/2015   Procedure: Left Heart Cath and Coronary Angiography;  Surgeon: Lennette Bihari, MD;  Location: [facility] INVASIVE CV LAB;  Service: Cardiovascular;  Laterality: N/A;  . FEMUR SURGERY  right  . FOOT ARTHRODESIS Right 11/10/2012   Procedure: Right Hallux Metatarsal-phalangeal Joint Arthrodesis; Second Metatarsal Head Resection and Second, Third and Fourth Metatarsal Weil Osteotomy;  Surgeon: Toni Arthurs, MD;  Location: [facility];  Service: Orthopedics;  Laterality: Right;  . HAMMER TOE SURGERY Right 11/10/2012   Procedure: Right Second, Third, and Fourth Hammertoe Corrections;  Surgeon: Toni Arthurs, MD;  Location: [facility];  Service: Orthopedics;  Laterality: Right;  . ICD implantation  06/18/2009   St Jude Fortify DR implanted by Dr. Johney Frame  . KNEE SURGERY    . NASAL SINUS SURGERY    . TRANSURETHRAL RESECTION OF PROSTATE  2013  . UMBILICAL HERNIA REPAIR      Current Outpatient Prescriptions  Medication Sig Dispense Refill  . amitriptyline (ELAVIL) 25 MG tablet Take 25 mg by mouth at bedtime as needed for sleep.    Marland Kitchen aspirin EC 81 MG tablet Take 81 mg by mouth daily.    . Aspirin-Salicylamide-Caffeine (BC HEADACHE) 325-95-16 MG TABS Take 1 packet by mouth daily as needed (for pain).    . busPIRone (BUSPAR) 10 MG tablet Take 10 mg by mouth daily.     . Cholecalciferol (VITAMIN D3) 5000 UNITS CAPS Take 1 capsule by mouth daily.    . fenofibrate 160 MG tablet Take 160 mg by mouth daily.      . furosemide (LASIX) 80 MG tablet Take 80 mg by mouth daily.    Marland Kitchen gabapentin (NEURONTIN) 600 MG tablet Take 600 mg by mouth 3 (three) times daily.    . metoprolol succinate (TOPROL-XL) 25 MG 24 hr tablet TAKE 1 TABLET BY MOUTH EVERY DAY 30 tablet 9  . nitroGLYCERIN (NITROSTAT) 0.4 MG SL tablet Place 1 tablet (0.4 mg total) under the tongue every 5 (five) minutes as needed for chest pain. 30  tablet 0  . omeprazole (PRILOSEC) 20 MG capsule Take 20 mg by mouth 2 (two) times daily.    Marland Kitchen oxyCODONE (OXYCONTIN) 60 MG 12 hr tablet Take 60 mg by mouth 2 (two) times daily. 5 each 0  . oxyCODONE (ROXICODONE) 15 MG immediate release tablet Take 15 mg by mouth 4 (four) times daily.     Marland Kitchen PROAIR HFA 108 (90 Base) MCG/ACT inhaler Inhale 1-2 puffs into the lungs every 6 (six) hours as needed for wheezing or shortness of breath.     . rosuvastatin (CRESTOR) 20 MG tablet Take 20 mg by mouth daily.    Marland Kitchen senna-docusate (SENOKOT-S) 8.6-50 MG tablet Take 2 tablets by mouth 2 (two) times daily. (Patient taking differently: Take 2 tablets by mouth at bedtime. ) 20 tablet 1  . tamsulosin (FLOMAX) 0.4 MG CAPS Take 0.4 mg by mouth  daily after supper.    . temazepam (RESTORIL) 30 MG capsule Take 30 mg by mouth at bedtime as needed for sleep.    Marland Kitchen venlafaxine XR (EFFEXOR-XR) 150 MG 24 hr capsule Take 150 mg by mouth daily.     No current facility-administered medications for this visit.    Allergies:  Penicillins   Social History: The patient  reports that he quit smoking about 19 years ago. His smoking use included Cigarettes. He has a 36.00 pack-year smoking history. He has never used smokeless tobacco. He reports that he does not drink alcohol or use drugs.   Family History: The patient's family history is not on file.   ROS:  Please see the history of present illness. Otherwise, complete review of systems is positive for {NONE DEFAULTED:18576::"none"}.  All other systems are reviewed and negative.   Physical Exam: VS:  There were no vitals taken for this visit., BMI There is no height or weight on file to calculate BMI.  Wt Readings from Last 3 Encounters:  07/03/16 194 lb 3.2 oz (88.1 kg)  03/03/16 190 lb (86.2 kg)  02/21/16 190 lb (86.2 kg)    Overweight male, comfortable at rest.  HEENT: Conjunctiva and lids normal, oropharynx clear.  Neck: Supple, no elevated JVP, no thyromegaly.  Lungs: Decreased but clear to auscultation, nonlabored breathing at rest.  Cardiac: Regular rate and rhythm with frequent ectopy, no S3, no pericardial rub.  Abdomen: Obese, nontender, bowel sounds present, no guarding or rebound.  Extremities: Trace to 1+ edema, distal pulses 1+.   ECG: I personally reviewed the tracing from 02/21/2016 which showed normal sinus rhythm with IVCD.  Recent Labwork: 12/09/2015: ALT 16; AST 33 12/10/2015: Magnesium 1.9 02/21/2016: BUN 18; Creatinine, Ser 1.38; Hemoglobin 12.6; Platelets 287; Potassium 3.7; Sodium 134   Other Studies Reviewed Today:  Cardiac catheterization 12/10/2015:  Mid RCA lesion, 100% stenosed.  Ost LM lesion, 20% stenosed.  Prox LAD to Mid LAD lesion, 20%  stenosed.  Mid LAD lesion, 20% stenosed.   Moderate LV dysfunction with focal significant hypokinesis in the mid inferior wall and mild distal anterolateral hypocontractility with an overall ejection fraction at 40%.  Significant coronary calcification with ostial smooth 20% narrowing of the left main; significant calcification of the LAD, proximal legs and into the mid segment with segmental 20% areas of luminal irregularity; normal ramus intermediate vessel; no significant stenoses in the left circumflex coronary artery; and total proximal occlusion of a moderate size RCA with extensive bridging collaterals supplying antegrade flow to the PDA and faint retrograde collaterals to the distal RCA via the left circulation.  Echocardiogram  11/22/2014: Study Conclusions  - Left ventricle: The cavity size was normal. Wall thickness was normal. Systolic function was normal. The estimated ejection fraction was in the range of 55% to 60%. Wall motion was normal; there were no regional wall motion abnormalities. Left ventricular diastolic function parameters were normal. - Aortic valve: Valve area (VTI): 3.78 cm^2. Valve area (Vmax): 3.44 cm^2. Valve area (Vmean): 3.59 cm^2. - Mitral valve: There was mild regurgitation directed posteriorly. - Left atrium: The atrium was moderately dilated. - Atrial septum: No defect or patent foramen ovale was identified. - Technically adequate study.  Assessment and Plan:    Current medicines were reviewed with the patient today.  No orders of the defined types were placed in this encounter.   Disposition:  Signed, Jonelle Sidle, MD, [facility] 11/09/2016 2:20 PM    [facility] at [facility] [address] [place], [place], [place] [address] Phone: [phone]; Fax: [phone]

## 2016-11-10 ENCOUNTER — Ambulatory Visit: Payer: Medicare HMO | Admitting: Cardiology

## 2016-11-10 ENCOUNTER — Telehealth: Payer: Self-pay

## 2016-11-10 ENCOUNTER — Ambulatory Visit (INDEPENDENT_AMBULATORY_CARE_PROVIDER_SITE_OTHER): Payer: Medicare HMO

## 2016-11-10 DIAGNOSIS — I5022 Chronic systolic (congestive) heart failure: Secondary | ICD-10-CM | POA: Diagnosis not present

## 2016-11-10 DIAGNOSIS — Z95810 Presence of automatic (implantable) cardiac defibrillator: Secondary | ICD-10-CM
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
## 2016-11-12 ENCOUNTER — Telehealth: Payer: Self-pay

## 2016-11-12 NOTE — Telephone Encounter (Signed)
Remote ICM transmission received.  Attempted patient call and left message to return call.   

## 2016-11-12 NOTE — Progress Notes (Signed)
EPIC Encounter for ICM Monitoring  Patient Name: [name] is a 71 y.o. male Date: 11/12/2016 Primary Care Physican: Samuel Jester, DO Primary Cardiologist:McDowell Electrophysiologist: Allred Dry Weight:Not weighing       Attempted call to patient and unable to reach.    Transmission reviewed.    Thoracic impedance normal but was abnormal suggesting fluid accumulation from 10/24/2016 to 11/07/2016.  Prescribed dosage: Furosemide 80 mg 1 tablet daily  LABS: 02/21/2016 Creatinine 1.38, BUN 18, Potassium 3.7, Sodium 134 12/11/2015 Creatinine 0.95, BUN 7, Potassium 4.1, Sodium 142 12/10/2015 Creatinine 1.19, BUN 10, Potassium 3.9, Sodium 141 12/09/2015 Creatinine 1.15, BUN 11, Potassium 3.4, Sodium 141 12/08/2015 Creatinine 1.28, BUN 18, Potassium 3.1, Sodium 139  Recommendations: NONE - Unable to reach patient   Follow-up plan: ICM clinic phone appointment on 12/15/2016.  Copy of ICM check sent to device physician.   3 month ICM trend: 11/11/2016   1 Year ICM trend:      Karie Soda, RN 11/12/2016 11:10 AM

## 2016-12-04 ENCOUNTER — Telehealth: Payer: Self-pay | Admitting: Cardiology

## 2016-12-04 NOTE — Telephone Encounter (Signed)
Spoke w/ pt and requested that he send a manual transmission b/c his home monitor has not updated in at least 7 days.   

## 2016-12-15 ENCOUNTER — Telehealth: Payer: Self-pay | Admitting: Cardiology

## 2016-12-15 ENCOUNTER — Telehealth: Payer: Self-pay

## 2016-12-15 ENCOUNTER — Ambulatory Visit (INDEPENDENT_AMBULATORY_CARE_PROVIDER_SITE_OTHER): Payer: Medicare HMO

## 2016-12-15 DIAGNOSIS — I5022 Chronic systolic (congestive) heart failure: Secondary | ICD-10-CM | POA: Diagnosis not present

## 2016-12-15 DIAGNOSIS — Z95810 Presence of automatic (implantable) cardiac defibrillator: Secondary | ICD-10-CM

## 2016-12-15 NOTE — Telephone Encounter (Signed)
Remote ICM transmission received.  Attempted patient call and no answer 

## 2016-12-15 NOTE — Progress Notes (Signed)
EPIC Encounter for ICM Monitoring  Patient Name: [name] is a 71 y.o. male Date: 12/15/2016 Primary Care Physican: Samuel Jester, DO Primary Cardiologist:[place] Electrophysiologist: Allred Dry Weight:Not weighing      Attempted call to patient and unable to reach.   Transmission reviewed.    Thoracic impedance normal but was abnormal suggesting fluid accumulation from 11/11/2016 to 11/19/2016, 11/29/2016 to 12/07/2016 with exception of a few days at baseline.  Prescribed dosage: Furosemide 80 mg 1 tablet daily  LABS: 02/21/2016 Creatinine 1.38, BUN 18, Potassium 3.7, Sodium 134 12/11/2015 Creatinine 0.95, BUN 7, Potassium 4.1, Sodium 142 12/10/2015 Creatinine 1.19, BUN 10, Potassium 3.9, Sodium 141 12/09/2015 Creatinine 1.15, BUN 11, Potassium 3.4, Sodium 141 12/08/2015 Creatinine 1.28, BUN 18, Potassium 3.1, Sodium 139  Recommendations: NONE - Unable to reach patient   Follow-up plan: ICM clinic phone appointment on 12/29/2016.  Office appointment scheduled on 12/30/2016 with Dr Diona Browner.  Copy of ICM check sent to device physician.   3 month ICM trend: 12/15/2016   1 Year ICM trend:      Karie Soda, RN 12/15/2016 4:54 PM

## 2016-12-15 NOTE — Telephone Encounter (Signed)
Spoke with pt and reminded pt of remote transmission that is due today. Pt verbalized understanding.   

## 2016-12-19 ENCOUNTER — Emergency Department (HOSPITAL_COMMUNITY)
Admission: EM | Admit: 2016-12-19 | Discharge: 2016-12-19 | Disposition: A | Discharge status: Home / Self Care | Payer: Medicare HMO | Attending: Emergency Medicine | Admitting: Emergency Medicine

## 2016-12-19 ENCOUNTER — Encounter (HOSPITAL_COMMUNITY): Payer: Self-pay

## 2016-12-19 ENCOUNTER — Emergency Department (HOSPITAL_COMMUNITY): Payer: Medicare HMO

## 2016-12-19 DIAGNOSIS — J441 Chronic obstructive pulmonary disease with (acute) exacerbation: Secondary | ICD-10-CM | POA: Diagnosis not present

## 2016-12-19 DIAGNOSIS — I2510 Atherosclerotic heart disease of native coronary artery without angina pectoris: Secondary | ICD-10-CM | POA: Insufficient documentation

## 2016-12-19 DIAGNOSIS — K5900 Constipation, unspecified: Secondary | ICD-10-CM | POA: Insufficient documentation

## 2016-12-19 DIAGNOSIS — I10 Essential (primary) hypertension: Secondary | ICD-10-CM | POA: Diagnosis not present

## 2016-12-19 DIAGNOSIS — M549 Dorsalgia, unspecified: Secondary | ICD-10-CM | POA: Diagnosis not present

## 2016-12-19 DIAGNOSIS — Z87891 Personal history of nicotine dependence: Secondary | ICD-10-CM | POA: Insufficient documentation

## 2016-12-19 DIAGNOSIS — Z79899 Other long term (current) drug therapy: Secondary | ICD-10-CM | POA: Diagnosis not present

## 2016-12-19 DIAGNOSIS — R110 Nausea: Secondary | ICD-10-CM | POA: Insufficient documentation

## 2016-12-19 DIAGNOSIS — R0602 Shortness of breath: Secondary | ICD-10-CM | POA: Diagnosis present

## 2016-12-19 DIAGNOSIS — Z7982 Long term (current) use of aspirin: Secondary | ICD-10-CM | POA: Insufficient documentation

## 2016-12-19 MED ORDER — PREDNISONE 10 MG PO TABS: 40.0000 mg | ORAL_TABLET | Freq: Every day | ORAL | 0 refills | Status: AC

## 2016-12-19 MED ORDER — PREDNISONE 50 MG PO TABS
60.0000 mg | ORAL_TABLET | Freq: Once | ORAL | Status: AC
  Administered 2016-12-19: 60 mg via ORAL
  Filled 2016-12-19: qty 1

## 2016-12-19 NOTE — Discharge Instructions (Signed)
Chest x-ray negative for pneumonia. Take the prednisone as directed for the next 5 days. Usual albuterol inhaler 2 puffs every 6 hours for the next week at least. Can take Mucinex DM as well. Return for any new or worse symptoms.

## 2016-12-19 NOTE — ED Notes (Signed)
Pt alert and oriented x 4. Wheeled out to front. Pt given discharge papers/prescriptions. Pt told to stop by registration to complete any additional paperwork. Pt left the department with no further questions.

## 2016-12-19 NOTE — ED Provider Notes (Signed)
[facility]-EMERGENCY DEPT Provider Note   CSN: [id number] Arrival date & time: 12/19/16  2012  By signing my name below, I, Bing Neighbors., attest that this documentation has been prepared under the direction and in the presence of Vanetta Mulders, MD. Electronically signed: Bing Neighbors., ED Scribe. 12/19/16. 10:27 PM.   History   Chief Complaint Chief Complaint  Patient presents with  . Shortness of Breath    HPI [name] is a 71 y.o. male with hx of HTN, defibrillator-pacemaker who presents to the Emergency Department complaining of SOB with onset x1 day. Pt states that he feels he may have pneumonia due to having green nasal congestion with blood streaks for the past x1 day. Pt reports productive cough with pink mucus. Pt has tried BC powder and inhaler with mild relief. He denies chest pain, leg swelling, vomiting, rash, dysuria. Of note, pt's PCP is Dr. Charm Barges. He denies hx of lung disease.   HPI  Past Medical History:  Diagnosis Date  . ACTH deficiency (HCC)    Dr. Lucianne Muss  . Cardiomyopathy    Mixed, LVEF 35% up to 50%  . COPD (chronic obstructive pulmonary disease) (HCC)   . Coronary atherosclerosis of native coronary artery    Occluded RCA with good collaterals  . DDD (degenerative disc disease)   . Depression   . Essential hypertension, benign   . Full dentures   . GERD (gastroesophageal reflux disease)   . H/O alcohol abuse   . High cholesterol   . ICD (implantable cardiac defibrillator) in place   . Mixed hyperlipidemia   . Obesity   . Osteoarthritis   . Sleep apnea    Noncompliance with CPAP therapy  . Wears glasses     Patient Active Problem List   Diagnosis Date Noted  . Chest pain 12/09/2015  . Unstable angina (HCC)   . Chronic obstructive pulmonary disease (HCC)   . Dyspnea 12/08/2015  . Chest discomfort 03/01/2015  . SOB (shortness of breath) 03/01/2015  . Chronic systolic heart failure (HCC) 01/11/2013  . Automatic  implantable cardioverter-defibrillator in situ 12/03/2011  . Mixed hyperlipidemia 05/24/2009  . Secondary cardiomyopathy (HCC) 05/24/2009  . CAD, NATIVE VESSEL 02/04/2009  . HYPOTENSION, UNSPECIFIED 02/04/2009    Past Surgical History:  Procedure Laterality Date  . ANKLE SURGERY  right  . BACK SURGERY     Multiple back surgeries for chronic back pain and lumbar fusion  . CARDIAC CATHETERIZATION N/A 12/10/2015   Procedure: Left Heart Cath and Coronary Angiography;  Surgeon: Lennette Bihari, MD;  Location: [facility] INVASIVE CV LAB;  Service: Cardiovascular;  Laterality: N/A;  . FEMUR SURGERY  right  . FOOT ARTHRODESIS Right 11/10/2012   Procedure: Right Hallux Metatarsal-phalangeal Joint Arthrodesis; Second Metatarsal Head Resection and Second, Third and Fourth Metatarsal Weil Osteotomy;  Surgeon: Toni Arthurs, MD;  Location: [facility];  Service: Orthopedics;  Laterality: Right;  . HAMMER TOE SURGERY Right 11/10/2012   Procedure: Right Second, Third, and Fourth Hammertoe Corrections;  Surgeon: Toni Arthurs, MD;  Location: [facility];  Service: Orthopedics;  Laterality: Right;  . ICD implantation  06/18/2009   St Jude Fortify DR implanted by Dr. Johney Frame  . KNEE SURGERY    . NASAL SINUS SURGERY    . TRANSURETHRAL RESECTION OF PROSTATE  2013  . UMBILICAL HERNIA REPAIR         Home Medications    Prior to Admission medications   Medication Sig Start  Date End Date Taking? Authorizing Provider  AMITIZA 24 MCG capsule Take 1 capsule by mouth daily. 12/10/16  Yes Historical Provider, MD  aspirin EC 81 MG tablet Take 81 mg by mouth daily.   Yes Historical Provider, MD  Aspirin-Salicylamide-Caffeine (BC HEADACHE) 325-95-16 MG TABS Take 1 packet by mouth daily as needed (for pain).   Yes Historical Provider, MD  Cholecalciferol (VITAMIN D3) 5000 UNITS CAPS Take 1 capsule by mouth daily.   Yes Historical Provider, MD  fenofibrate 160 MG tablet Take 160 mg by mouth daily.      Yes Historical Provider, MD  furosemide (LASIX) 80 MG tablet Take 80 mg by mouth daily.   Yes Historical Provider, MD  gabapentin (NEURONTIN) 600 MG tablet Take 600 mg by mouth 3 (three) times daily.   Yes Historical Provider, MD  metoprolol succinate (TOPROL-XL) 25 MG 24 hr tablet TAKE 1 TABLET BY MOUTH EVERY DAY 04/20/16  Yes Jonelle Sidle, MD  nitroGLYCERIN (NITROSTAT) 0.4 MG SL tablet Place 1 tablet (0.4 mg total) under the tongue every 5 (five) minutes as needed for chest pain. 02/22/16  Yes [name], MD  omega-3 acid ethyl esters (LOVAZA) 1 g capsule Take 1 g by mouth 2 (two) times daily.   Yes Historical Provider, MD  omeprazole (PRILOSEC) 20 MG capsule Take 20 mg by mouth 2 (two) times daily. 12/05/15  Yes Historical Provider, MD  oxyCODONE (OXYCONTIN) 60 MG 12 hr tablet Take 60 mg by mouth 2 (two) times daily. 03/03/16  Yes Lavera Guise, MD  oxyCODONE (ROXICODONE) 15 MG immediate release tablet Take 15 mg by mouth 4 (four) times daily.  05/17/15  Yes Historical Provider, MD  PROAIR HFA 108 (90 Base) MCG/ACT inhaler Inhale 1-2 puffs into the lungs every 6 (six) hours as needed for wheezing or shortness of breath.  10/07/15  Yes Historical Provider, MD  QUEtiapine (SEROQUEL) 25 MG tablet Take 25 mg by mouth at bedtime.   Yes Historical Provider, MD  rosuvastatin (CRESTOR) 20 MG tablet Take 20 mg by mouth daily.   Yes Historical Provider, MD  senna-docusate (SENOKOT-S) 8.6-50 MG tablet Take 2 tablets by mouth 2 (two) times daily. 12/11/15  Yes Zannie Cove, MD  tamsulosin (FLOMAX) 0.4 MG CAPS Take 0.4 mg by mouth daily after supper.   Yes Historical Provider, MD  temazepam (RESTORIL) 30 MG capsule Take 30 mg by mouth at bedtime as needed for sleep.   Yes Historical Provider, MD  venlafaxine XR (EFFEXOR-XR) 150 MG 24 hr capsule Take 150 mg by mouth daily. 12/05/15  Yes Historical Provider, MD  amitriptyline (ELAVIL) 25 MG tablet Take 25 mg by mouth at bedtime as needed for sleep.    Historical  Provider, MD  busPIRone (BUSPAR) 10 MG tablet Take 10 mg by mouth daily.  02/25/16   Historical Provider, MD  predniSONE (DELTASONE) 10 MG tablet Take 4 tablets (40 mg total) by mouth daily. 12/19/16   Vanetta Mulders, MD    Family History Family History  Problem Relation Age of Onset  . Coronary artery disease    . Arthritis      Social History Social History  Substance Use Topics  . Smoking status: Former Smoker    Packs/day: 1.00    Years: 36.00    Types: Cigarettes    Quit date: 05/26/1997  . Smokeless tobacco: Never Used  . Alcohol use 0.0 oz/week     Comment: History of abuse. Still admits to occasional binges (denies 12/08/15)  Allergies   Penicillins   Review of Systems Review of Systems  Constitutional: Positive for chills and fever.  HENT: Positive for congestion.   Eyes: Negative for visual disturbance.  Respiratory: Positive for cough and shortness of breath.   Cardiovascular: Negative for chest pain and leg swelling.  Gastrointestinal: Positive for constipation and nausea. Negative for abdominal pain and vomiting.  Genitourinary: Negative for dysuria and hematuria.  Musculoskeletal: Positive for back pain.  Skin: Negative for rash.  Neurological: Positive for headaches.  Hematological: Does not bruise/bleed easily.     Physical Exam Updated Vital Signs BP 113/84 (BP Location: Left Arm)   Pulse 87   Temp 99 F (37.2 C) (Oral)   Resp 18   Ht 5\' 11"  (1.803 m)   Wt 90.7 kg   SpO2 96%   BMI 27.89 kg/m   Physical Exam  Constitutional: He appears well-developed and well-nourished.  HENT:  Head: Normocephalic and atraumatic.  Mouth/Throat: Oropharynx is clear and moist and mucous membranes are normal.  Eyes: Conjunctivae are normal.  Neck: Neck supple.  Cardiovascular: Normal rate, regular rhythm and normal heart sounds.   No murmur heard. Pulmonary/Chest: Effort normal and breath sounds normal. No respiratory distress.  Abdominal: Soft. There  is no tenderness.  Musculoskeletal: He exhibits no edema.       Right foot: There is no swelling.       Left foot: There is no swelling.  No ankle swelling.  Neurological: He is alert. No cranial nerve deficit or sensory deficit. He exhibits normal muscle tone. Coordination normal.  Skin: Skin is warm and dry.  Psychiatric: He has a normal mood and affect.  Nursing note and vitals reviewed.    ED Treatments / Results   DIAGNOSTIC STUDIES: Oxygen Saturation is 96% on RA, adequate by my interpretation.   COORDINATION OF CARE: 10:27 PM-Discussed next steps with pt. Pt verbalized understanding and is agreeable with the plan.    Labs (all labs ordered are listed, but only abnormal results are displayed) Labs Reviewed - No data to display  EKG  EKG Interpretation None       Radiology Dg Chest 2 View  Result Date: 12/19/2016 CLINICAL DATA:  Acute onset of shortness of breath. Productive cough. Bilateral lower lobe crackles. Initial encounter. EXAM: CHEST  2 VIEW COMPARISON:  Chest radiograph performed 02/21/2016 FINDINGS: The lungs are well-aerated and clear. There is no evidence of focal opacification, pleural effusion or pneumothorax. The heart is normal in size; a pacemaker/AICD is noted at the left chest wall, with leads ending at the right atrium and right ventricle. No acute osseous abnormalities are seen. IMPRESSION: No acute cardiopulmonary process seen. Electronically Signed   By: Roanna Raider M.D.   On: 12/19/2016 21:41    Procedures Procedures (including critical care time)  Medications Ordered in ED Medications  predniSONE (DELTASONE) tablet 60 mg (not administered)     Initial Impression / Assessment and Plan / ED Course  I have reviewed the triage vital signs and the nursing notes.  Pertinent labs & imaging results that were available during my care of the patient were reviewed by me and considered in my medical decision making (see chart for details).      Patient with a history of COPD. No wheezing here. But has some difficulty breathing. Has a little bit increased productive cough. Patient nontoxic no acute distress. Room air sats are normal chest x-ray negative for pneumonia. Feel that this is an exacerbation of COPD perhaps  with some irritation due to the pollen. We'll give a dose of prednisone here in continue a 5 day course of prednisone. We'll have patient continue with his albuterol inhaler every 6 hours. Also stated he could take Mucinex DM. He will follow-up with his doctor and return for any new or worse symptoms.  Final Clinical Impressions(s) / ED Diagnoses   Final diagnoses:  COPD exacerbation (HCC)    New Prescriptions New Prescriptions   PREDNISONE (DELTASONE) 10 MG TABLET    Take 4 tablets (40 mg total) by mouth daily.  I personally performed the services described in this documentation, which was scribed in my presence. The recorded information has been reviewed and is accurate.       Vanetta Mulders, MD 12/19/16 2228

## 2016-12-19 NOTE — ED Triage Notes (Signed)
Patient is having some shortness of breath.  Coughing up mucus that has been thick and brown.  Started this past week.  Having fine crackles in bi-lateral lower lobes.

## 2016-12-29 ENCOUNTER — Telehealth: Payer: Self-pay | Admitting: Cardiology

## 2016-12-29 ENCOUNTER — Telehealth: Payer: Self-pay

## 2016-12-29 ENCOUNTER — Ambulatory Visit (INDEPENDENT_AMBULATORY_CARE_PROVIDER_SITE_OTHER): Payer: Self-pay

## 2016-12-29 ENCOUNTER — Ambulatory Visit: Payer: Self-pay | Admitting: General Surgery

## 2016-12-29 DIAGNOSIS — I5022 Chronic systolic (congestive) heart failure: Secondary | ICD-10-CM

## 2016-12-29 DIAGNOSIS — Z95810 Presence of automatic (implantable) cardiac defibrillator: Secondary | ICD-10-CM

## 2016-12-29 NOTE — Progress Notes (Signed)
Cardiology Office Note  Date: 12/30/2016   ID: [name], [name] 04/16/1946, MRN [id number]  PCP: Nimrod Wendt Jester, DO  Primary Cardiologist: Nona Dell, MD   Chief Complaint  Patient presents with  . Coronary Artery Disease  . History of cardiomyopathy    History of Present Illness: [name] is a 71 y.o. male last seen in April 2017. He is here today for a routine follow-up visit. He reports chronic dyspnea exertion, no angina symptoms or palpitations.  He continues to follow in the device clinic with Dr. Johney Frame, St. Jude ICD in place. Recent thoracic impedance check showed some fluid accumulation in March in early April but recently normal. He states he is compliant with Lasix. Does not report any leg edema.  I personally reviewed his ECG today which shows sinus rhythm with lead artifact, IVCD and nonspecific ST changes.  Cardiac catheterization from last year showed stable coronary anatomy. We did discuss updating his echocardiogram to reassess LVEF which has fluctuated over the years.  Past Medical History:  Diagnosis Date  . ACTH deficiency (HCC)    Dr. Lucianne Muss  . Cardiomyopathy    Mixed, LVEF 35% up to 50%  . COPD (chronic obstructive pulmonary disease) (HCC)   . Coronary atherosclerosis of native coronary artery    Occluded RCA with good collaterals  . DDD (degenerative disc disease)   . Depression   . Essential hypertension, benign   . Full dentures   . GERD (gastroesophageal reflux disease)   . H/O alcohol abuse   . High cholesterol   . ICD (implantable cardiac defibrillator) in place   . Mixed hyperlipidemia   . Obesity   . Osteoarthritis   . Sleep apnea    Noncompliance with CPAP therapy  . Wears glasses     Past Surgical History:  Procedure Laterality Date  . ANKLE SURGERY  right  . BACK SURGERY     Multiple back surgeries for chronic back pain and lumbar fusion  . CARDIAC CATHETERIZATION N/A 12/10/2015   Procedure: Left Heart Cath and  Coronary Angiography;  Surgeon: Lennette Bihari, MD;  Location: [facility] INVASIVE CV LAB;  Service: Cardiovascular;  Laterality: N/A;  . FEMUR SURGERY  right  . FOOT ARTHRODESIS Right 11/10/2012   Procedure: Right Hallux Metatarsal-phalangeal Joint Arthrodesis; Second Metatarsal Head Resection and Second, Third and Fourth Metatarsal Weil Osteotomy;  Surgeon: Toni Arthurs, MD;  Location: [facility];  Service: Orthopedics;  Laterality: Right;  . HAMMER TOE SURGERY Right 11/10/2012   Procedure: Right Second, Third, and Fourth Hammertoe Corrections;  Surgeon: Toni Arthurs, MD;  Location: [facility];  Service: Orthopedics;  Laterality: Right;  . ICD implantation  06/18/2009   St Jude Fortify DR implanted by Dr. Johney Frame  . KNEE SURGERY    . NASAL SINUS SURGERY    . TRANSURETHRAL RESECTION OF PROSTATE  2013  . UMBILICAL HERNIA REPAIR      Current Outpatient Prescriptions  Medication Sig Dispense Refill  . AMITIZA 24 MCG capsule Take 1 capsule by mouth daily.    Marland Kitchen amitriptyline (ELAVIL) 25 MG tablet Take 25 mg by mouth at bedtime as needed for sleep.    Marland Kitchen aspirin EC 81 MG tablet Take 81 mg by mouth daily.    . Cholecalciferol (VITAMIN D3) 5000 UNITS CAPS Take 1 capsule by mouth daily.    . fenofibrate 160 MG tablet Take 160 mg by mouth daily.      . furosemide (LASIX) 80  MG tablet Take 80 mg by mouth daily.    Marland Kitchen gabapentin (NEURONTIN) 600 MG tablet Take 600 mg by mouth 3 (three) times daily.    . metoprolol succinate (TOPROL-XL) 25 MG 24 hr tablet TAKE 1 TABLET BY MOUTH EVERY DAY 30 tablet 9  . omega-3 acid ethyl esters (LOVAZA) 1 g capsule Take 1 g by mouth 2 (two) times daily.    Marland Kitchen omeprazole (PRILOSEC) 20 MG capsule Take 20 mg by mouth 2 (two) times daily.    Marland Kitchen oxyCODONE (OXYCONTIN) 60 MG 12 hr tablet Take 60 mg by mouth 2 (two) times daily. 5 each 0  . oxyCODONE (ROXICODONE) 15 MG immediate release tablet Take 15 mg by mouth 4 (four) times daily.     . predniSONE (DELTASONE)  10 MG tablet Take 4 tablets (40 mg total) by mouth daily. 20 tablet 0  . PROAIR HFA 108 (90 Base) MCG/ACT inhaler Inhale 1-2 puffs into the lungs every 6 (six) hours as needed for wheezing or shortness of breath.     . QUEtiapine (SEROQUEL) 25 MG tablet Take 25 mg by mouth at bedtime.    . rosuvastatin (CRESTOR) 20 MG tablet Take 20 mg by mouth daily.    Marland Kitchen senna-docusate (SENOKOT-S) 8.6-50 MG tablet Take 2 tablets by mouth 2 (two) times daily. 20 tablet 1  . tamsulosin (FLOMAX) 0.4 MG CAPS Take 0.4 mg by mouth daily after supper.    . temazepam (RESTORIL) 30 MG capsule Take 30 mg by mouth at bedtime as needed for sleep.    Marland Kitchen venlafaxine XR (EFFEXOR-XR) 150 MG 24 hr capsule Take 150 mg by mouth daily.     No current facility-administered medications for this visit.    Allergies:  Penicillins   Social History: The patient  reports that he quit smoking about 19 years ago. His smoking use included Cigarettes. He started smoking about 55 years ago. He has a 36.00 pack-year smoking history. He has never used smokeless tobacco. He reports that he drinks alcohol. He reports that he does not use drugs.   ROS:  Please see the history of present illness. Otherwise, complete review of systems is positive for trouble with memory.  All other systems are reviewed and negative.   Physical Exam: VS:  BP (!) 93/56   Pulse 68   Ht 5\' 9"  (1.753 m)   Wt 202 lb (91.6 kg)   SpO2 98% Comment: on room air  BMI 29.83 kg/m , BMI Body mass index is 29.83 kg/m.  Wt Readings from Last 3 Encounters:  12/30/16 202 lb (91.6 kg)  12/19/16 200 lb (90.7 kg)  07/03/16 194 lb 3.2 oz (88.1 kg)    Overweight male, comfortable at rest.  HEENT: Conjunctiva and lids normal, oropharynx clear.  Neck: Supple, no elevated JVP, no thyromegaly.  Lungs: Decreased but clear to auscultation, nonlabored breathing at rest.  Cardiac: Regular rate and rhythm with frequent ectopy, no S3, no pericardial rub.  Abdomen: Obese,  nontender, bowel sounds present, no guarding or rebound.  Extremities: No leg edema, distal pulses 1+.  Skin: Warm and dry. Musculoskeletal: No kyphosis. Neuropsychiatric: Alert and oriented 3, affect appropriate.  ECG: I personally reviewed the tracing from 02/21/2016 which showed sinus rhythm.  Recent Labwork: 02/21/2016: BUN 18; Creatinine, Ser 1.38; Hemoglobin 12.6; Platelets 287; Potassium 3.7; Sodium 134   Other Studies Reviewed Today:  Echocardiogram 11/22/2014: Study Conclusions  - Left ventricle: The cavity size was normal. Wall thickness was normal. Systolic function was normal.  The estimated ejection fraction was in the range of 55% to 60%. Wall motion was normal; there were no regional wall motion abnormalities. Left ventricular diastolic function parameters were normal. - Aortic valve: Valve area (VTI): 3.78 cm^2. Valve area (Vmax): 3.44 cm^2. Valve area (Vmean): 3.59 cm^2. - Mitral valve: There was mild regurgitation directed posteriorly. - Left atrium: The atrium was moderately dilated. - Atrial septum: No defect or patent foramen ovale was identified. - Technically adequate study.  Cardiac catheterization 12/10/2015:  Mid RCA lesion, 100% stenosed.  Ost LM lesion, 20% stenosed.  Prox LAD to Mid LAD lesion, 20% stenosed.  Mid LAD lesion, 20% stenosed.   Moderate LV dysfunction with focal significant hypokinesis in the mid inferior wall and mild distal anterolateral hypocontractility with an overall ejection fraction at 40%.  Significant coronary calcification with ostial smooth 20% narrowing of the left main; significant calcification of the LAD, proximal legs and into the mid segment with segmental 20% areas of luminal irregularity; normal ramus intermediate vessel; no significant stenoses in the left circumflex coronary artery; and total proximal occlusion of a moderate size RCA with extensive bridging collaterals supplying antegrade flow to the PDA  and faint retrograde collaterals to the distal RCA via the left circulation.  Assessment and Plan:  1. Ischemic cardiomyopathy, plan to update echocardiogram to reassess LVEF. He is on Toprol-XL and Lasix, medical therapy somewhat limited due to relatively low blood pressures.  2. CAD with known occluded RCA associated with collaterals. Coronary anatomy stable by cardiac catheterization from last year. He remains on aspirin and statin.  3. St. Jude ICD in place, no device discharges. He follows with Dr. Johney Frame.  4. History of essential hypertension, blood pressure somewhat low today.  Current medicines were reviewed with the patient today.   Orders Placed This Encounter  Procedures  . EKG 12-Lead  . ECHOCARDIOGRAM COMPLETE    Disposition: Follow-up in 6 months.  Signed, Jonelle Sidle, MD, [facility] 12/30/2016 11:29 AM    [facility] at [facility] [address] [place], [place], [place] [address] Phone: [phone]; Fax: [phone]

## 2016-12-29 NOTE — Progress Notes (Addendum)
EPIC Encounter for ICM Monitoring  Patient Name: [name] is a 71 y.o. male Date: 12/29/2016 Primary Care Physican: Samuel Jester, DO Primary Cardiologist:McDowell Electrophysiologist: Allred Dry Weight:Not weighing      Attempted call to patient and unable to reach.   Transmission reviewed.  ER visit on 12/19/2016   Thoracic impedance normal but has been abnormal suggesting fluid accumulation from 11/11/2016 to 11/19/2016, 11/29/2016 to 12/07/2016, 12/23/2016 to 12/24/2016..  Prescribed dosage: Furosemide 80 mg 1 tablet daily  LABS: 02/21/2016 Creatinine 1.38, BUN 18, Potassium 3.7, Sodium 134 12/11/2015 Creatinine 0.95, BUN 7, Potassium 4.1, Sodium 142 12/10/2015 Creatinine 1.19, BUN 10, Potassium 3.9, Sodium 141 12/09/2015 Creatinine 1.15, BUN 11, Potassium 3.4, Sodium 141 12/08/2015 Creatinine 1.28, BUN 18, Potassium 3.1, Sodium 139  Recommendations: NONE - Unable to reach patient   Follow-up plan: ICM clinic phone appointment on 01/18/2017.  Office appointment scheduled on 12/30/2016 with Dr Diona Browner.  Copy of ICM check sent to primary cardiologist and device physician.   3 month ICM trend: 12/29/2016   1 Year ICM trend:      Karie Soda, RN 12/29/2016 4:28 PM

## 2016-12-29 NOTE — Telephone Encounter (Signed)
Remote ICM transmission received.  Attempted patient call and no message. 

## 2016-12-29 NOTE — Telephone Encounter (Signed)
Spoke with pt and reminded pt of remote transmission that is due today. Pt verbalized understanding.   

## 2016-12-30 ENCOUNTER — Encounter: Payer: Self-pay | Admitting: Cardiology

## 2016-12-30 ENCOUNTER — Ambulatory Visit (INDEPENDENT_AMBULATORY_CARE_PROVIDER_SITE_OTHER): Payer: Medicare HMO | Admitting: Cardiology

## 2016-12-30 VITALS — BP 93/56 | HR 68 | Ht 69.0 in | Wt 202.0 lb

## 2016-12-30 DIAGNOSIS — I255 Ischemic cardiomyopathy: Secondary | ICD-10-CM

## 2016-12-30 DIAGNOSIS — I2510 Atherosclerotic heart disease of native coronary artery without angina pectoris: Secondary | ICD-10-CM | POA: Diagnosis not present

## 2016-12-30 DIAGNOSIS — Z95810 Presence of automatic (implantable) cardiac defibrillator: Secondary | ICD-10-CM | POA: Diagnosis not present

## 2016-12-30 DIAGNOSIS — I10 Essential (primary) hypertension: Secondary | ICD-10-CM | POA: Diagnosis not present

## 2016-12-30 NOTE — Patient Instructions (Signed)

## 2016-12-31 ENCOUNTER — Ambulatory Visit: Payer: Self-pay | Admitting: General Surgery

## 2017-01-02 ENCOUNTER — Encounter (HOSPITAL_COMMUNITY): Payer: Self-pay | Admitting: Emergency Medicine

## 2017-01-02 ENCOUNTER — Emergency Department (HOSPITAL_COMMUNITY): Payer: Medicare HMO

## 2017-01-02 ENCOUNTER — Emergency Department (HOSPITAL_COMMUNITY)
Admission: EM | Admit: 2017-01-02 | Discharge: 2017-01-02 | Disposition: A | Discharge status: Home / Self Care | Payer: Medicare HMO | Attending: Emergency Medicine | Admitting: Emergency Medicine

## 2017-01-02 DIAGNOSIS — Z87891 Personal history of nicotine dependence: Secondary | ICD-10-CM | POA: Insufficient documentation

## 2017-01-02 DIAGNOSIS — I2510 Atherosclerotic heart disease of native coronary artery without angina pectoris: Secondary | ICD-10-CM | POA: Insufficient documentation

## 2017-01-02 DIAGNOSIS — F10120 Alcohol abuse with intoxication, uncomplicated: Secondary | ICD-10-CM | POA: Diagnosis not present

## 2017-01-02 DIAGNOSIS — R0602 Shortness of breath: Secondary | ICD-10-CM | POA: Insufficient documentation

## 2017-01-02 DIAGNOSIS — I10 Essential (primary) hypertension: Secondary | ICD-10-CM | POA: Insufficient documentation

## 2017-01-02 DIAGNOSIS — Z79899 Other long term (current) drug therapy: Secondary | ICD-10-CM | POA: Diagnosis not present

## 2017-01-02 DIAGNOSIS — J449 Chronic obstructive pulmonary disease, unspecified: Secondary | ICD-10-CM | POA: Diagnosis not present

## 2017-01-02 DIAGNOSIS — F10920 Alcohol use, unspecified with intoxication, uncomplicated: Secondary | ICD-10-CM

## 2017-01-02 LAB — I-STAT TROPONIN, ED: Troponin i, poc: 0.01 ng/mL (ref 0.00–0.08)

## 2017-01-02 LAB — CBC
HCT: 36 % — ABNORMAL LOW (ref 39.0–52.0)
Hemoglobin: 11.8 g/dL — ABNORMAL LOW (ref 13.0–17.0)
MCH: 29.9 pg (ref 26.0–34.0)
MCHC: 32.8 g/dL (ref 30.0–36.0)
MCV: 91.1 fL (ref 78.0–100.0)
PLATELETS: 280 10*3/uL (ref 150–400)
RBC: 3.95 MIL/uL — AB (ref 4.22–5.81)
RDW: 13.3 % (ref 11.5–15.5)
WBC: 6 10*3/uL (ref 4.0–10.5)

## 2017-01-02 LAB — BASIC METABOLIC PANEL
Anion gap: 9 (ref 5–15)
BUN: 14 mg/dL (ref 6–20)
CO2: 29 mmol/L (ref 22–32)
Calcium: 8.9 mg/dL (ref 8.9–10.3)
Chloride: 99 mmol/L — ABNORMAL LOW (ref 101–111)
Creatinine, Ser: 1.23 mg/dL (ref 0.61–1.24)
GFR calc Af Amer: >60 mL/min (ref >60)
GFR, EST NON AFRICAN AMERICAN: 58 mL/min — AB (ref >60)
GLUCOSE: 108 mg/dL — AB (ref 65–99)
POTASSIUM: 3.2 mmol/L — AB (ref 3.5–5.1)
Sodium: 137 mmol/L (ref 135–145)

## 2017-01-02 MED ORDER — ACETAMINOPHEN 325 MG PO TABS
650.0000 mg | ORAL_TABLET | Freq: Once | ORAL | Status: AC
  Administered 2017-01-02: 650 mg via ORAL
  Filled 2017-01-02: qty 2
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
## 2017-01-02 NOTE — ED Notes (Signed)
Chest xray has not resulted at this time. Per EDP, has reviewed chest xray and reports continue with pt discharge. nad noted.
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
## 2017-01-07 ENCOUNTER — Telehealth: Payer: Self-pay | Admitting: Cardiology

## 2017-01-07 NOTE — Telephone Encounter (Signed)
LMOVM requesting that pt send manual transmission b/c home monitor has not updated in at least 7 days.    

## 2017-01-14 ENCOUNTER — Ambulatory Visit: Payer: Self-pay | Admitting: General Surgery

## 2017-01-18 ENCOUNTER — Ambulatory Visit (INDEPENDENT_AMBULATORY_CARE_PROVIDER_SITE_OTHER): Payer: Medicare HMO | Admitting: *Deleted

## 2017-01-18 DIAGNOSIS — Z95810 Presence of automatic (implantable) cardiac defibrillator: Secondary | ICD-10-CM | POA: Diagnosis not present

## 2017-01-18 DIAGNOSIS — I5022 Chronic systolic (congestive) heart failure: Secondary | ICD-10-CM | POA: Diagnosis not present

## 2017-01-18 DIAGNOSIS — I255 Ischemic cardiomyopathy: Secondary | ICD-10-CM

## 2017-01-18 NOTE — Progress Notes (Signed)
EPIC Encounter for ICM Monitoring  Patient Name: [name] is a 71 y.o. male Date: 01/18/2017 Primary Care Physican: Samuel Jester, DO Primary Cardiologist:McDowell Electrophysiologist: [name] Dry Weight:Not weighing       Heart Failure questions reviewed, pt asymptomatic.  ER visit on 01/02/2017    Thoracic impedance starting to be abnormal suggesting fluid accumulation on 01/17/2017.  Prescribed dosage: Furosemide 80 mg 1 tablet daily  LABS: 02/21/2016 Creatinine 1.38, BUN 18, Potassium 3.7, Sodium 134 12/11/2015 Creatinine 0.95, BUN 7, Potassium 4.1, Sodium 142 12/10/2015 Creatinine 1.19, BUN 10, Potassium 3.9, Sodium 141 12/09/2015 Creatinine 1.15, BUN 11, Potassium 3.4, Sodium 141 12/08/2015 Creatinine 1.28, BUN 18, Potassium 3.1, Sodium 139  Recommendations:   Copy of ICM check sent to Dr Diona Browner and Dr Johney Frame for review and if any recommendations will call him back.   Advised to limit salt intake to 2000 mg daily and fluid intake to 64 oz daily.  Encouraged to call for fluid symptoms or use local ER for any urgent symptoms.  Follow-up plan: ICM clinic phone appointment on 01/26/2017 to recheck fluid levels    3 month ICM trend: 01/18/2017  1 Year ICM trend:      Karie Soda, RN 01/18/2017 1:33 PM

## 2017-01-19 NOTE — Progress Notes (Signed)
Remote ICD transmission.   

## 2017-01-20 ENCOUNTER — Encounter: Payer: Self-pay | Admitting: Cardiology

## 2017-01-21 LAB — CUP PACEART REMOTE DEVICE CHECK
Brady Statistic RV Percent Paced: 3.6 %
Date Time Interrogation Session: 20180524075144
Implantable Lead Implant Date: 20101019
Implantable Lead Location: 753860
Lead Channel Setting Pacing Pulse Width: 0.8 ms
Lead Channel Setting Sensing Sensitivity: 0.5 mV
MDC IDC LEAD IMPLANT DT: 20101019
MDC IDC LEAD LOCATION: 753859
MDC IDC MSMT BATTERY REMAINING LONGEVITY: 24 mo
MDC IDC PG IMPLANT DT: 20101019
MDC IDC SET LEADCHNL RA PACING AMPLITUDE: 2 V
MDC IDC SET LEADCHNL RV PACING AMPLITUDE: 2.5 V
MDC IDC STAT BRADY RA PERCENT PACED: 50 %
Pulse Gen Serial Number: 593609

## 2017-01-26 ENCOUNTER — Ambulatory Visit: Payer: Self-pay | Admitting: General Surgery

## 2017-01-26 ENCOUNTER — Telehealth: Payer: Self-pay | Admitting: Cardiology

## 2017-01-26 NOTE — Telephone Encounter (Signed)
LMOVM reminding pt to send remote transmission.   

## 2017-01-28 ENCOUNTER — Other Ambulatory Visit: Payer: Self-pay | Admitting: Cardiology

## 2017-01-28 ENCOUNTER — Other Ambulatory Visit: Payer: Self-pay

## 2017-01-28 ENCOUNTER — Ambulatory Visit (INDEPENDENT_AMBULATORY_CARE_PROVIDER_SITE_OTHER): Payer: Medicare HMO

## 2017-01-28 DIAGNOSIS — I255 Ischemic cardiomyopathy: Secondary | ICD-10-CM | POA: Diagnosis not present

## 2017-01-28 LAB — ECHOCARDIOGRAM COMPLETE
CHL CUP DOP CALC LVOT VTI: 14.1 cm
CHL CUP MV DEC (S): 293
CHL CUP STROKE VOLUME: 69 mL
E/e' ratio: 3.93
EWDT: 293 ms
FS: 26 % — AB (ref 28–44)
IVS/LV PW RATIO, ED: 0.74
LA diam end sys: 37 mm
LA diam index: 1.75 cm/m2
LA vol: 92.3 mL
LASIZE: 37 mm
LAVOLA4C: 71.4 mL
LAVOLIN: 43.7 mL/m2
LV E/e' medial: 3.93
LV E/e'average: 3.93
LV PW d: 11.9 mm — AB (ref 0.6–1.1)
LV TDI E'MEDIAL: 6.29
LV dias vol index: 59 mL/m2
LV e' LATERAL: 12.6 cm/s
LVDIAVOL: 125 mL (ref 62–150)
LVOT area: 5.73 cm2
LVOTD: 27 mm
LVOTPV: 69.5 cm/s
LVOTSV: 81 mL
LVSYSVOL: 56 mL (ref 21–61)
LVSYSVOLIN: 26 mL/m2
MV pk A vel: 71.8 m/s
MV pk E vel: 49.5 m/s
RV LATERAL S' VELOCITY: 15.5 cm/s
RV TAPSE: 23.4 mm
Reg peak vel: 209 cm/s
Simpson's disk: 55
TDI e' lateral: 12.6
TR max vel: 209 cm/s

## 2017-01-29 ENCOUNTER — Telehealth: Payer: Self-pay

## 2017-01-29 NOTE — Telephone Encounter (Signed)
-----   Message from Lydia M Anderson, LPN sent at 01/29/2017  7:19 AM EDT -----   ----- Message ----- From: McDowell, Samuel G, MD Sent: 01/28/2017   4:49 PM To: Lydia M Anderson, LPN  Results reviewed. LVEF overall stable at 50-55% range. Continue with current treatment. A copy of this test should be forwarded to Butler, Cynthia, DO. 

## 2017-02-01 NOTE — Telephone Encounter (Signed)
-----   Message from Lydia M Anderson, LPN sent at 01/29/2017  7:19 AM EDT -----   ----- Message ----- From: McDowell, Samuel G, MD Sent: 01/28/2017   4:49 PM To: Lydia M Anderson, LPN  Results reviewed. LVEF overall stable at 50-55% range. Continue with current treatment. A copy of this test should be forwarded to Butler, Cynthia, DO. 

## 2017-02-28 NOTE — Telephone Encounter (Signed)
-----   Message from Eustace Moore, LPN sent at 5/0/0370  7:19 AM EDT -----   ----- Message ----- From: Jonelle Sidle, MD Sent: 01/28/2017   4:49 PM To: Eustace Moore, LPN  Results reviewed. LVEF overall stable at 50-55% range. Continue with current treatment. A copy of this test should be forwarded to Samuel Jester, DO.

## 2017-02-28 NOTE — Telephone Encounter (Signed)
Letter mailed to patient with results. Routed to PCP

## 2017-02-28 NOTE — Progress Notes (Signed)
No ICM remote transmission received for 01/26/2017 and next ICM transmission scheduled for 02/15/2017.

## 2017-02-28 DEATH — deceased

## 2017-07-02 ENCOUNTER — Encounter: Payer: Medicare HMO | Admitting: Internal Medicine

## 2017-07-05 IMAGING — DX DG CHEST 2V
3 series · 3 of 3 positions shown · non-contrast
Comparison: Chest radiograph performed 02/21/2016

CLINICAL DATA: Acute onset of shortness of breath. Productive
cough. Bilateral lower lobe crackles. Initial encounter.

EXAM:
CHEST  2 VIEW

[chest lat (1 of 2)]
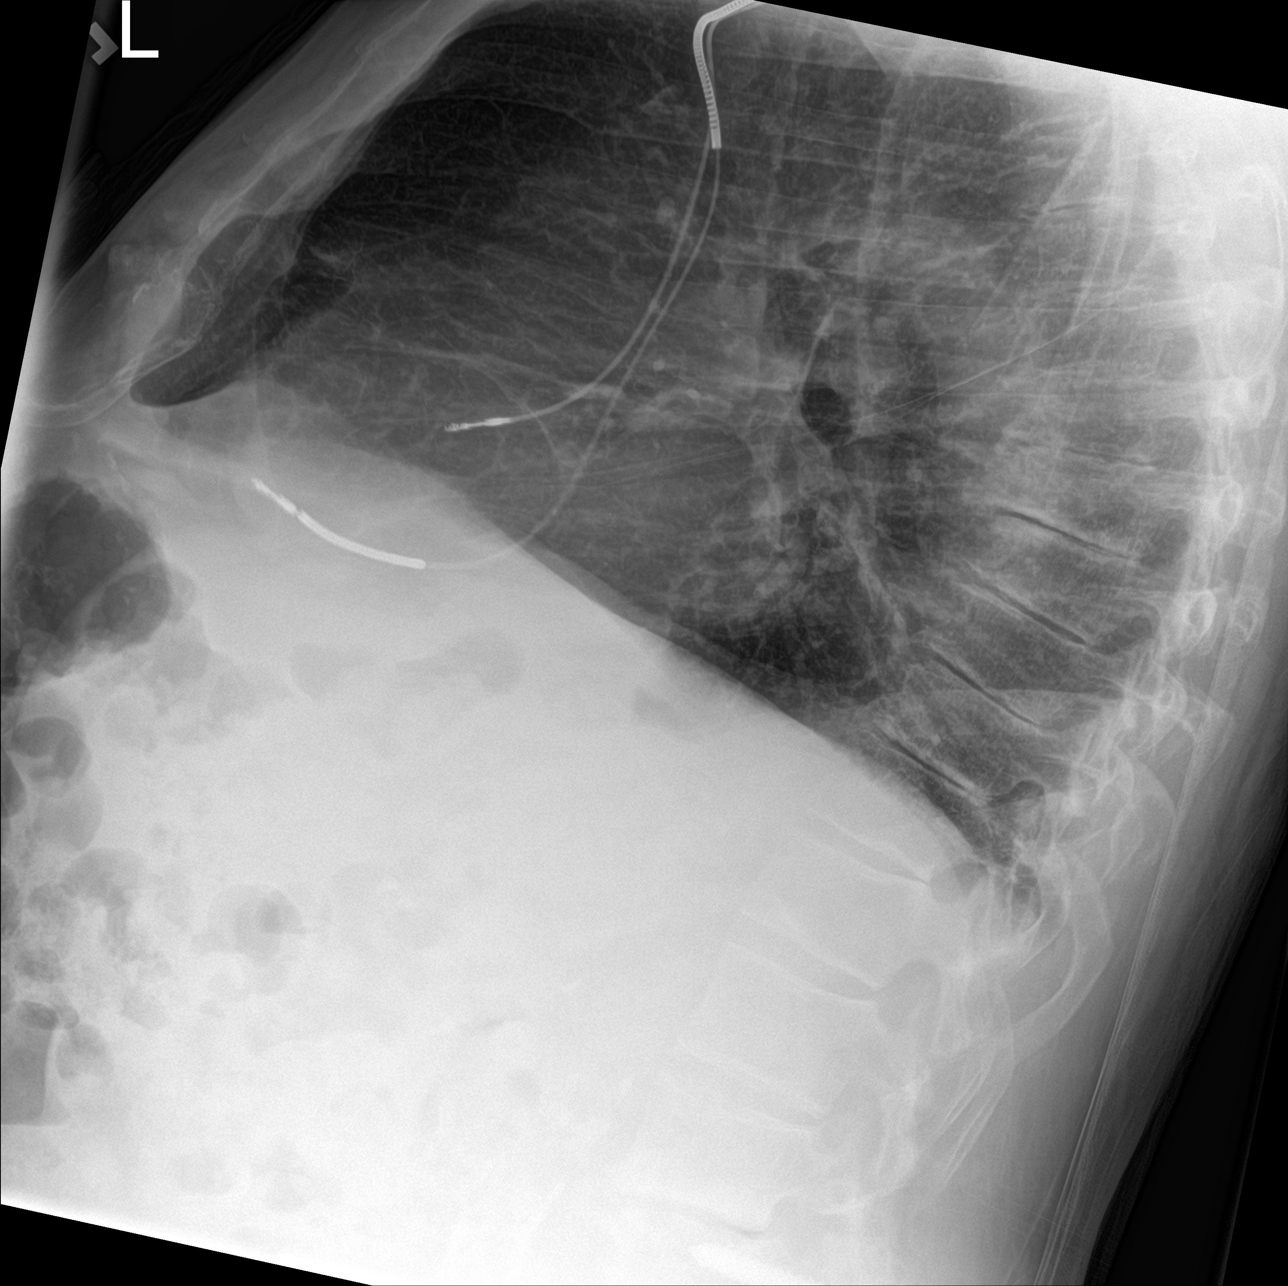

[chest ap]
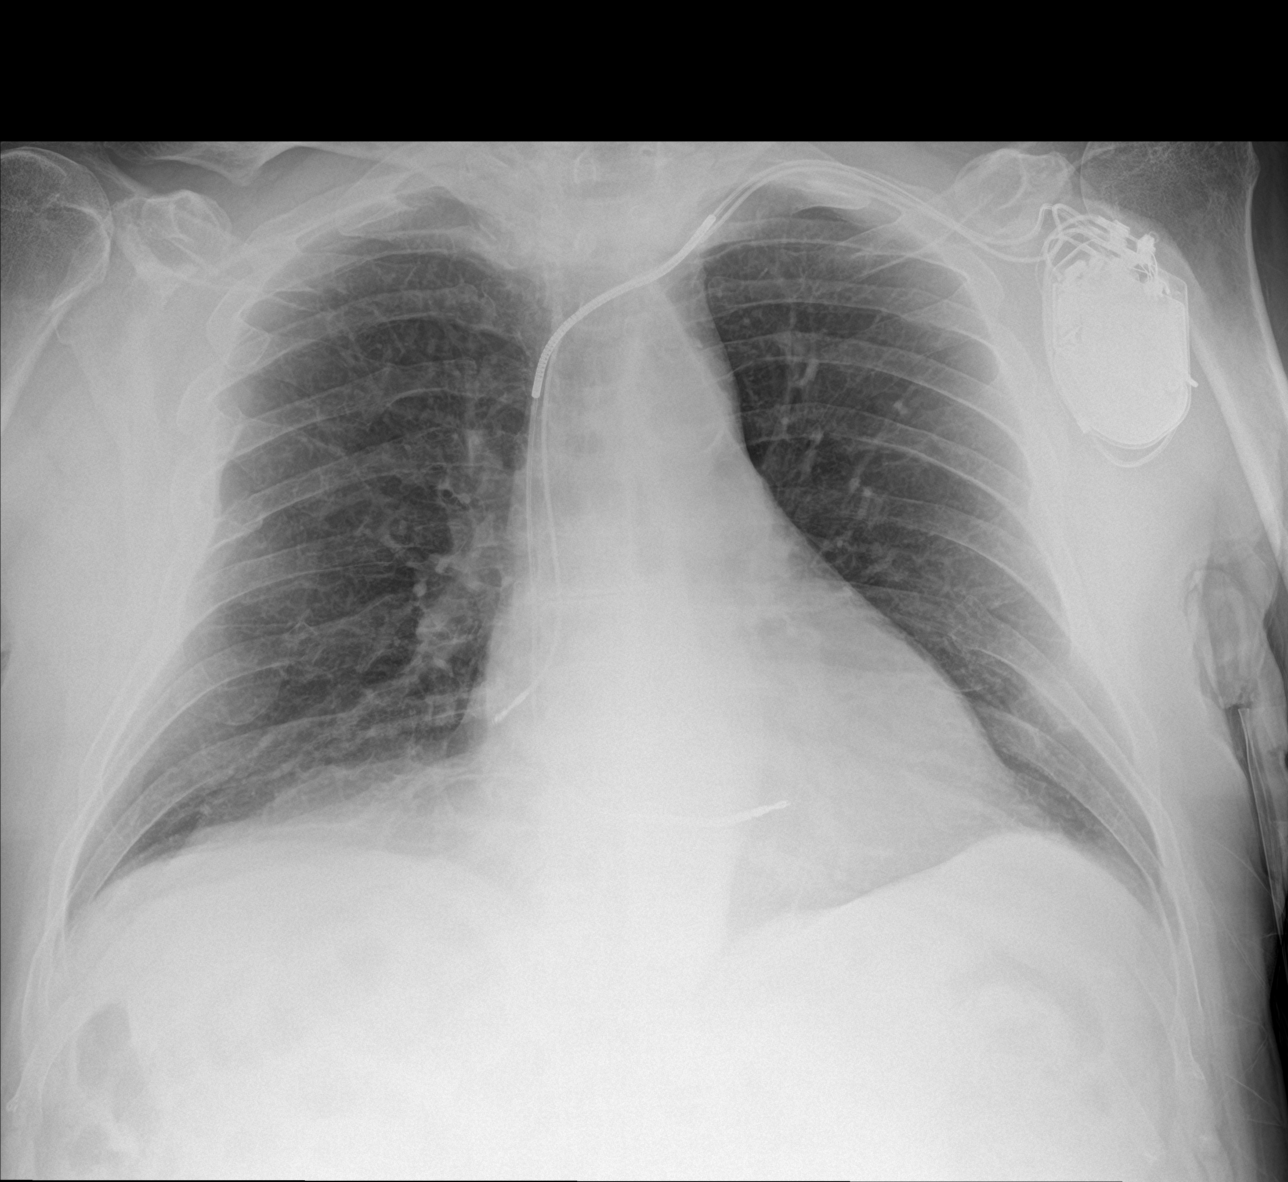

[chest lat (2 of 2)]
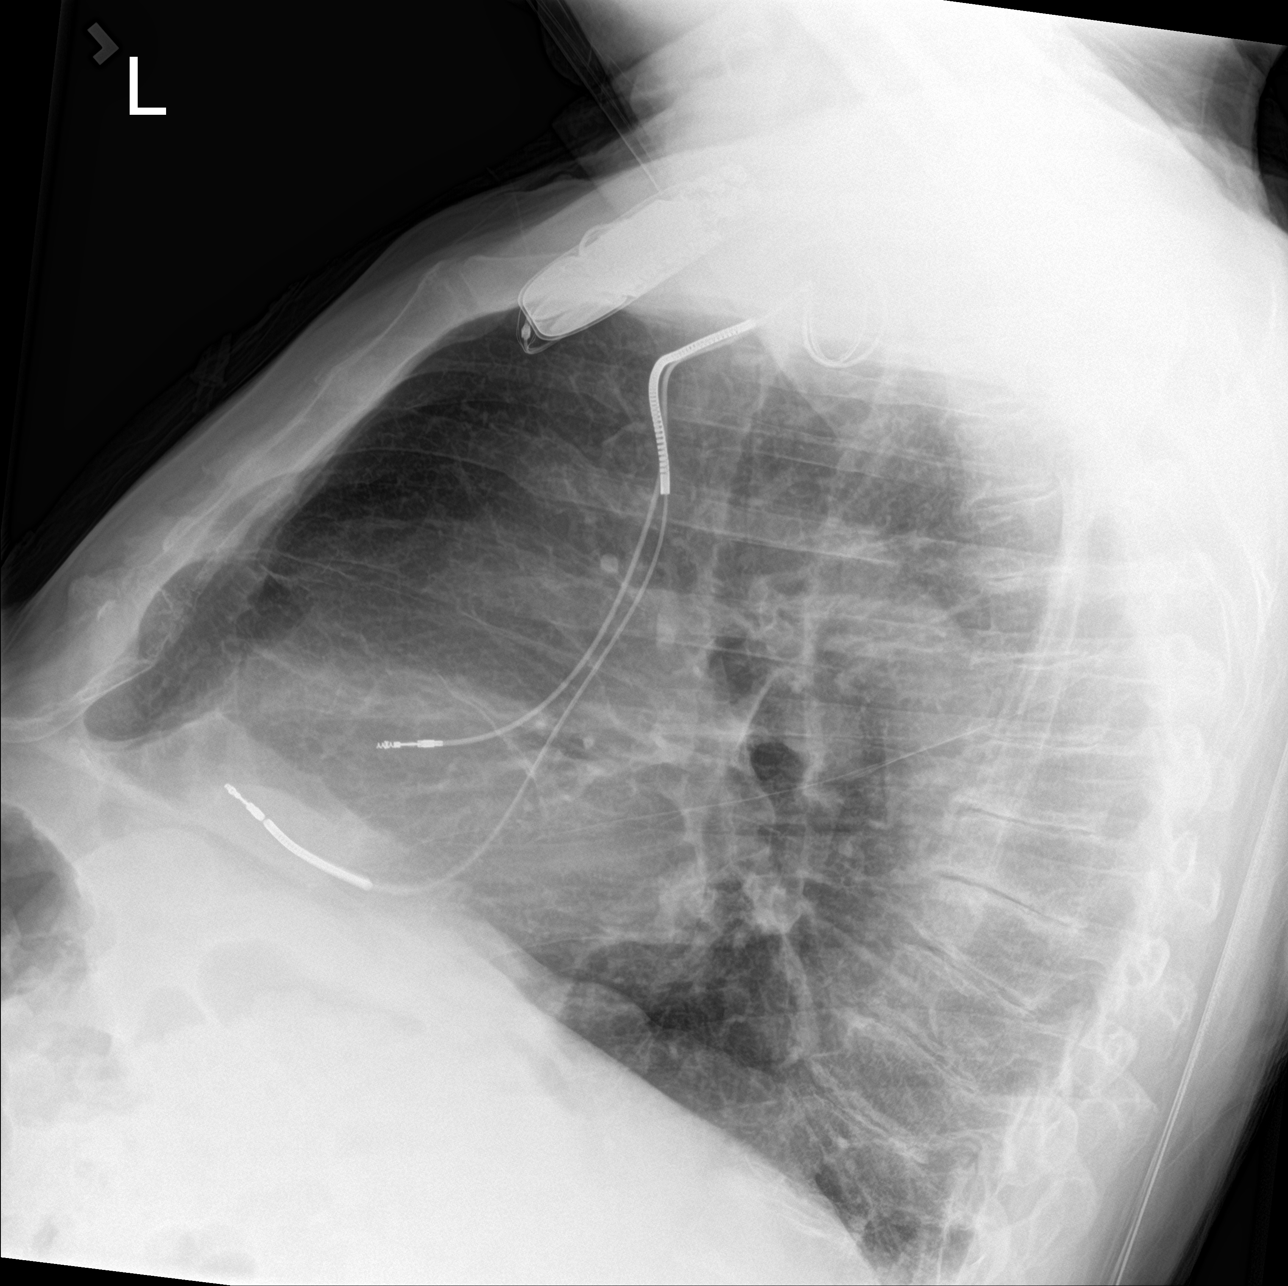

[3 of 3 positions shown; findings below may reference images not displayed]

FINDINGS: The lungs are well-aerated and clear. There is no evidence of focal
opacification, pleural effusion or pneumothorax.

The heart is normal in size; a pacemaker/AICD is noted at the left
chest wall, with leads ending at the right atrium and right
ventricle. No acute osseous abnormalities are seen.
IMPRESSION: No acute cardiopulmonary process seen.

## 2018-01-31 ENCOUNTER — Other Ambulatory Visit: Payer: Self-pay | Admitting: Cardiology
